# Patient Record
Sex: Male | Born: 1985 | Race: Black or African American | Hispanic: No | Marital: Single | Smoking: Former smoker
Health system: Southern US, Community
[De-identification: ages and names within clinical notes are randomized; demographics above are authoritative.]

## PROBLEM LIST (undated history)

## (undated) DIAGNOSIS — J189 Pneumonia, unspecified organism: Secondary | ICD-10-CM

## (undated) DIAGNOSIS — E119 Type 2 diabetes mellitus without complications: Secondary | ICD-10-CM

## (undated) DIAGNOSIS — J45909 Unspecified asthma, uncomplicated: Secondary | ICD-10-CM

---
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
## 2007-06-30 ENCOUNTER — Emergency Department (HOSPITAL_COMMUNITY): Admission: EM | Admit: 2007-06-30 | Discharge: 2007-06-30 | Payer: Self-pay | Admitting: Emergency Medicine

## 2007-07-23 ENCOUNTER — Emergency Department (HOSPITAL_COMMUNITY): Admission: EM | Admit: 2007-07-23 | Discharge: 2007-07-23 | Payer: Self-pay | Admitting: Emergency Medicine

## 2007-08-04 ENCOUNTER — Emergency Department (HOSPITAL_COMMUNITY): Admission: EM | Admit: 2007-08-04 | Discharge: 2007-08-04 | Payer: Self-pay | Admitting: Emergency Medicine

## 2008-02-27 ENCOUNTER — Emergency Department (HOSPITAL_COMMUNITY): Admission: EM | Admit: 2008-02-27 | Discharge: 2008-02-27 | Payer: Self-pay | Admitting: Emergency Medicine

## 2008-12-04 ENCOUNTER — Emergency Department (HOSPITAL_COMMUNITY): Admission: EM | Admit: 2008-12-04 | Discharge: 2008-12-04 | Payer: Self-pay | Admitting: Emergency Medicine

## 2008-12-18 ENCOUNTER — Emergency Department (HOSPITAL_COMMUNITY): Admission: EM | Admit: 2008-12-18 | Discharge: 2008-12-18 | Payer: Self-pay | Admitting: Emergency Medicine

## 2008-12-26 ENCOUNTER — Encounter: Payer: Self-pay | Admitting: *Deleted

## 2009-05-06 ENCOUNTER — Emergency Department (HOSPITAL_COMMUNITY): Admission: EM | Admit: 2009-05-06 | Discharge: 2009-05-06 | Payer: Self-pay | Admitting: Emergency Medicine

## 2009-07-24 ENCOUNTER — Inpatient Hospital Stay (HOSPITAL_COMMUNITY): Admission: EM | Admit: 2009-07-24 | Discharge: 2009-07-27 | Payer: Self-pay | Admitting: Emergency Medicine

## 2009-07-24 ENCOUNTER — Ambulatory Visit: Payer: Self-pay | Admitting: Internal Medicine

## 2009-07-30 ENCOUNTER — Telehealth: Payer: Self-pay | Admitting: Internal Medicine

## 2009-08-02 DIAGNOSIS — R7309 Other abnormal glucose: Secondary | ICD-10-CM | POA: Insufficient documentation

## 2009-08-02 DIAGNOSIS — J9600 Acute respiratory failure, unspecified whether with hypoxia or hypercapnia: Secondary | ICD-10-CM | POA: Insufficient documentation

## 2009-08-02 DIAGNOSIS — K219 Gastro-esophageal reflux disease without esophagitis: Secondary | ICD-10-CM

## 2009-08-02 DIAGNOSIS — J309 Allergic rhinitis, unspecified: Secondary | ICD-10-CM | POA: Insufficient documentation

## 2009-08-06 ENCOUNTER — Telehealth: Payer: Self-pay | Admitting: Internal Medicine

## 2009-08-07 ENCOUNTER — Encounter (INDEPENDENT_AMBULATORY_CARE_PROVIDER_SITE_OTHER): Payer: Self-pay | Admitting: *Deleted

## 2010-03-21 NOTE — Letter (Signed)
Summary: [facility]  [address]   [place], [place] [address]   Phone: [phone]  Fax: [phone]    08/07/2009  Henry Hardin [address] [place], [place]  [address]  Dear Henry Hardin,   Our records indicate that you did not keep your appointment with Dr. Sherene Sires for your hospital followup.  It is critical that you reschedule this appointment so that we can offer you the best possible care and try to keep you from having to go back to the hospital.  Please call our office at [phone] to schedule appointment as soon as possible.          Sincerely,   [facility]

## 2010-03-21 NOTE — Progress Notes (Signed)
Summary: PA for advair approved  Phone Note Outgoing Call   Call placed by: Vernie Murders,  July 30, 2009 4:11 PM Call placed to: Insurer Summary of Call: Called [employer] for PA on advair 250/50.  Was able to approve this over the phone from 07/30/09 until 07/30/11.  Faxed back PA form to [employer] [address] with this information. Initial call taken by: Vernie Murders,  July 30, 2009 4:12 PM

## 2010-03-21 NOTE — Progress Notes (Signed)
Summary: nos appt  Phone Note Call from Patient   Caller: [email or web address]  Call For: Henry Hardin Summary of Call: ATC pt to rsc nos from 6/17 phone disconnected. Initial call taken by: Darletta Moll,  August 06, 2009 2:47 PM     Appended Document: nos appt will need to send letter to last know address, it is critical that pt get f/u asap  Appended Document: nos appt Letter mailed today.

## 2010-05-06 LAB — GLUCOSE, CAPILLARY
Glucose-Capillary: 136 mg/dL — ABNORMAL HIGH (ref 70–99)
Glucose-Capillary: 150 mg/dL — ABNORMAL HIGH (ref 70–99)
Glucose-Capillary: 166 mg/dL — ABNORMAL HIGH (ref 70–99)
Glucose-Capillary: 190 mg/dL — ABNORMAL HIGH (ref 70–99)
Glucose-Capillary: 221 mg/dL — ABNORMAL HIGH (ref 70–99)
Glucose-Capillary: 226 mg/dL — ABNORMAL HIGH (ref 70–99)

## 2010-05-06 LAB — BASIC METABOLIC PANEL
CO2: 24 mEq/L (ref 19–32)
Calcium: 8.9 mg/dL (ref 8.4–10.5)
Chloride: 107 mEq/L (ref 96–112)
Creatinine, Ser: 1.2 mg/dL (ref 0.4–1.5)
Creatinine, Ser: 1.23 mg/dL (ref 0.4–1.5)
GFR calc Af Amer: >60 mL/min (ref >60)
GFR calc Af Amer: >60 mL/min (ref >60)
GFR calc non Af Amer: >60 mL/min (ref >60)
GFR calc non Af Amer: >60 mL/min (ref >60)
Glucose, Bld: 172 mg/dL — ABNORMAL HIGH (ref 70–99)
Potassium: 4.4 mEq/L (ref 3.5–5.1)
Sodium: 138 mEq/L (ref 135–145)
Sodium: 139 mEq/L (ref 135–145)
Sodium: 141 mEq/L (ref 135–145)

## 2010-05-06 LAB — POCT I-STAT 3, ART BLOOD GAS (G3+)
Acid-base deficit: 1 mmol/L (ref 0.0–2.0)
Bicarbonate: 26.2 mEq/L — ABNORMAL HIGH (ref 20.0–24.0)
O2 Saturation: 86 %
pCO2 arterial: 51.3 mmHg — ABNORMAL HIGH (ref 35.0–45.0)
pH, Arterial: 7.315 — ABNORMAL LOW (ref 7.350–7.450)
pO2, Arterial: 56 mmHg — ABNORMAL LOW (ref 80.0–100.0)

## 2010-05-06 LAB — DIFFERENTIAL
Eosinophils Absolute: 0.6 10*3/uL (ref 0.0–0.7)
Eosinophils Relative: 5 % (ref 0–5)
Lymphs Abs: 8 10*3/uL — ABNORMAL HIGH (ref 0.7–4.0)
Monocytes Absolute: 1 10*3/uL (ref 0.1–1.0)
Monocytes Relative: 8 % (ref 3–12)
Neutrophils Relative %: 25 % — ABNORMAL LOW (ref 43–77)

## 2010-05-06 LAB — CK TOTAL AND CKMB (NOT AT ARMC): CK, MB: 9.5 ng/mL (ref 0.3–4.0)

## 2010-05-06 LAB — HEPATIC FUNCTION PANEL
ALT: 22 U/L (ref 0–53)
Albumin: 3.5 g/dL (ref 3.5–5.2)
Total Protein: 6.7 g/dL (ref 6.0–8.3)

## 2010-05-06 LAB — URINALYSIS, ROUTINE W REFLEX MICROSCOPIC
Leukocytes, UA: NEGATIVE
Specific Gravity, Urine: 1.018 (ref 1.005–1.030)

## 2010-05-06 LAB — LIPID PANEL
Cholesterol: 113 mg/dL (ref 0–200)
HDL: 41 mg/dL (ref >39)
Total CHOL/HDL Ratio: 2.8 RATIO
VLDL: 7 mg/dL (ref 0–40)

## 2010-05-06 LAB — RAPID URINE DRUG SCREEN, HOSP PERFORMED
Barbiturates: NOT DETECTED
Benzodiazepines: POSITIVE — AB
Cocaine: NOT DETECTED
Opiates: NOT DETECTED
Tetrahydrocannabinol: NOT DETECTED

## 2010-05-06 LAB — CBC
HCT: 41.7 % (ref 39.0–52.0)
HCT: 45.8 % (ref 39.0–52.0)
Hemoglobin: 13.9 g/dL (ref 13.0–17.0)
Hemoglobin: 15.4 g/dL (ref 13.0–17.0)
MCV: 88.8 fL (ref 78.0–100.0)
MCV: 89 fL (ref 78.0–100.0)
RBC: 4.69 MIL/uL (ref 4.22–5.81)
RBC: 5.16 MIL/uL (ref 4.22–5.81)
RDW: 13 % (ref 11.5–15.5)
WBC: 10.6 10*3/uL — ABNORMAL HIGH (ref 4.0–10.5)
WBC: 12.8 10*3/uL — ABNORMAL HIGH (ref 4.0–10.5)

## 2010-05-06 LAB — PHOSPHORUS: Phosphorus: 2.8 mg/dL (ref 2.3–4.6)

## 2010-05-06 LAB — LACTIC ACID, PLASMA: Lactic Acid, Venous: 1.3 mmol/L (ref 0.5–2.2)

## 2010-05-06 LAB — CARDIAC PANEL(CRET KIN+CKTOT+MB+TROPI)
CK, MB: 13.9 ng/mL (ref 0.3–4.0)
Total CK: 797 U/L — ABNORMAL HIGH (ref 7–232)

## 2010-05-06 LAB — LIPASE, BLOOD: Lipase: 29 U/L (ref 11–59)

## 2010-05-06 LAB — URINE MICROSCOPIC-ADD ON

## 2010-05-06 LAB — TROPONIN I: Troponin I: 0.13 ng/mL — ABNORMAL HIGH (ref 0.00–0.06)

## 2010-05-07 ENCOUNTER — Emergency Department (HOSPITAL_COMMUNITY): Payer: Medicaid Other

## 2010-05-07 ENCOUNTER — Emergency Department (HOSPITAL_COMMUNITY)
Admission: EM | Admit: 2010-05-07 | Discharge: 2010-05-07 | Disposition: A | Discharge status: Home / Self Care | Payer: Medicaid Other | Attending: Emergency Medicine | Admitting: Emergency Medicine

## 2010-05-07 DIAGNOSIS — Y9367 Activity, basketball: Secondary | ICD-10-CM | POA: Insufficient documentation

## 2010-05-07 DIAGNOSIS — J45909 Unspecified asthma, uncomplicated: Secondary | ICD-10-CM | POA: Insufficient documentation

## 2010-05-07 DIAGNOSIS — IMO0002 Reserved for concepts with insufficient information to code with codable children: Secondary | ICD-10-CM | POA: Insufficient documentation

## 2010-05-07 DIAGNOSIS — S8990XA Unspecified injury of unspecified lower leg, initial encounter: Secondary | ICD-10-CM | POA: Insufficient documentation

## 2010-05-07 DIAGNOSIS — Y9239 Other specified sports and athletic area as the place of occurrence of the external cause: Secondary | ICD-10-CM | POA: Insufficient documentation

## 2010-05-07 DIAGNOSIS — M25569 Pain in unspecified knee: Secondary | ICD-10-CM | POA: Insufficient documentation

## 2010-05-07 DIAGNOSIS — X500XXA Overexertion from strenuous movement or load, initial encounter: Secondary | ICD-10-CM | POA: Insufficient documentation

## 2010-05-10 ENCOUNTER — Other Ambulatory Visit (HOSPITAL_COMMUNITY): Payer: Self-pay | Admitting: Orthopedic Surgery

## 2010-05-10 DIAGNOSIS — S83519A Sprain of anterior cruciate ligament of unspecified knee, initial encounter: Secondary | ICD-10-CM

## 2010-05-10 DIAGNOSIS — M25562 Pain in left knee: Secondary | ICD-10-CM

## 2010-05-16 ENCOUNTER — Ambulatory Visit (HOSPITAL_COMMUNITY)
Admission: RE | Admit: 2010-05-16 | Discharge: 2010-05-16 | Disposition: A | Discharge status: Home / Self Care | Payer: Medicaid Other | Source: Ambulatory Visit | Attending: Orthopedic Surgery | Admitting: Orthopedic Surgery

## 2010-05-16 DIAGNOSIS — M25562 Pain in left knee: Secondary | ICD-10-CM

## 2010-05-16 DIAGNOSIS — S83509A Sprain of unspecified cruciate ligament of unspecified knee, initial encounter: Secondary | ICD-10-CM | POA: Insufficient documentation

## 2010-05-16 DIAGNOSIS — Y9367 Activity, basketball: Secondary | ICD-10-CM | POA: Insufficient documentation

## 2010-05-16 DIAGNOSIS — S83519A Sprain of anterior cruciate ligament of unspecified knee, initial encounter: Secondary | ICD-10-CM

## 2010-05-16 DIAGNOSIS — X58XXXA Exposure to other specified factors, initial encounter: Secondary | ICD-10-CM | POA: Insufficient documentation

## 2010-05-23 LAB — RAPID STREP SCREEN (MED CTR MEBANE ONLY): Streptococcus, Group A Screen (Direct): POSITIVE — AB

## 2010-06-01 ENCOUNTER — Emergency Department (HOSPITAL_COMMUNITY)
Admission: EM | Admit: 2010-06-01 | Discharge: 2010-06-02 | Disposition: A | Discharge status: Home / Self Care | Payer: Medicaid Other | Attending: Emergency Medicine | Admitting: Emergency Medicine

## 2010-06-01 ENCOUNTER — Emergency Department (HOSPITAL_COMMUNITY): Payer: Medicaid Other

## 2010-06-01 DIAGNOSIS — IMO0002 Reserved for concepts with insufficient information to code with codable children: Secondary | ICD-10-CM | POA: Insufficient documentation

## 2010-06-01 DIAGNOSIS — M25669 Stiffness of unspecified knee, not elsewhere classified: Secondary | ICD-10-CM | POA: Insufficient documentation

## 2010-06-01 DIAGNOSIS — M25569 Pain in unspecified knee: Secondary | ICD-10-CM | POA: Insufficient documentation

## 2010-06-01 DIAGNOSIS — S8990XA Unspecified injury of unspecified lower leg, initial encounter: Secondary | ICD-10-CM | POA: Insufficient documentation

## 2010-06-01 DIAGNOSIS — X500XXA Overexertion from strenuous movement or load, initial encounter: Secondary | ICD-10-CM | POA: Insufficient documentation

## 2010-06-01 DIAGNOSIS — Y9367 Activity, basketball: Secondary | ICD-10-CM | POA: Insufficient documentation

## 2010-06-01 DIAGNOSIS — M25469 Effusion, unspecified knee: Secondary | ICD-10-CM | POA: Insufficient documentation

## 2010-06-01 DIAGNOSIS — Y9239 Other specified sports and athletic area as the place of occurrence of the external cause: Secondary | ICD-10-CM | POA: Insufficient documentation

## 2010-06-01 DIAGNOSIS — Y92838 Other recreation area as the place of occurrence of the external cause: Secondary | ICD-10-CM | POA: Insufficient documentation

## 2010-08-30 ENCOUNTER — Emergency Department (HOSPITAL_COMMUNITY)
Admission: EM | Admit: 2010-08-30 | Discharge: 2010-08-30 | Disposition: A | Discharge status: Home / Self Care | Payer: Medicaid Other | Attending: Emergency Medicine | Admitting: Emergency Medicine

## 2010-08-30 DIAGNOSIS — J45909 Unspecified asthma, uncomplicated: Secondary | ICD-10-CM | POA: Insufficient documentation

## 2010-10-21 ENCOUNTER — Emergency Department (HOSPITAL_COMMUNITY)
Admission: EM | Admit: 2010-10-21 | Discharge: 2010-10-21 | Disposition: A | Discharge status: Home / Self Care | Payer: Self-pay | Attending: Emergency Medicine | Admitting: Emergency Medicine

## 2010-10-21 DIAGNOSIS — T7840XA Allergy, unspecified, initial encounter: Secondary | ICD-10-CM | POA: Insufficient documentation

## 2010-10-21 DIAGNOSIS — X58XXXA Exposure to other specified factors, initial encounter: Secondary | ICD-10-CM | POA: Insufficient documentation

## 2010-10-21 DIAGNOSIS — J45909 Unspecified asthma, uncomplicated: Secondary | ICD-10-CM | POA: Insufficient documentation

## 2010-11-22 ENCOUNTER — Emergency Department (HOSPITAL_COMMUNITY)
Admission: EM | Admit: 2010-11-22 | Discharge: 2010-11-22 | Disposition: A | Discharge status: Home / Self Care | Payer: Self-pay | Attending: Emergency Medicine | Admitting: Emergency Medicine

## 2010-11-22 DIAGNOSIS — J069 Acute upper respiratory infection, unspecified: Secondary | ICD-10-CM | POA: Insufficient documentation

## 2010-11-22 DIAGNOSIS — R070 Pain in throat: Secondary | ICD-10-CM | POA: Insufficient documentation

## 2010-11-22 DIAGNOSIS — J3489 Other specified disorders of nose and nasal sinuses: Secondary | ICD-10-CM | POA: Insufficient documentation

## 2010-11-22 DIAGNOSIS — R05 Cough: Secondary | ICD-10-CM | POA: Insufficient documentation

## 2010-11-22 DIAGNOSIS — R509 Fever, unspecified: Secondary | ICD-10-CM | POA: Insufficient documentation

## 2010-11-22 DIAGNOSIS — R059 Cough, unspecified: Secondary | ICD-10-CM | POA: Insufficient documentation

## 2010-11-22 DIAGNOSIS — R599 Enlarged lymph nodes, unspecified: Secondary | ICD-10-CM | POA: Insufficient documentation

## 2010-11-22 DIAGNOSIS — J45909 Unspecified asthma, uncomplicated: Secondary | ICD-10-CM | POA: Insufficient documentation

## 2010-11-22 LAB — RAPID STREP SCREEN (MED CTR MEBANE ONLY): Streptococcus, Group A Screen (Direct): NEGATIVE

## 2011-12-14 ENCOUNTER — Encounter (HOSPITAL_COMMUNITY): Payer: Self-pay

## 2011-12-14 ENCOUNTER — Emergency Department (HOSPITAL_COMMUNITY): Payer: BC Managed Care – PPO

## 2011-12-14 ENCOUNTER — Emergency Department (HOSPITAL_COMMUNITY)
Admission: EM | Admit: 2011-12-14 | Discharge: 2011-12-14 | Disposition: A | Discharge status: Home / Self Care | Payer: BC Managed Care – PPO | Attending: Emergency Medicine | Admitting: Emergency Medicine

## 2011-12-14 DIAGNOSIS — F17200 Nicotine dependence, unspecified, uncomplicated: Secondary | ICD-10-CM | POA: Insufficient documentation

## 2011-12-14 DIAGNOSIS — R059 Cough, unspecified: Secondary | ICD-10-CM | POA: Insufficient documentation

## 2011-12-14 DIAGNOSIS — R110 Nausea: Secondary | ICD-10-CM | POA: Insufficient documentation

## 2011-12-14 DIAGNOSIS — R05 Cough: Secondary | ICD-10-CM | POA: Insufficient documentation

## 2011-12-14 DIAGNOSIS — R062 Wheezing: Secondary | ICD-10-CM | POA: Insufficient documentation

## 2011-12-14 DIAGNOSIS — J45901 Unspecified asthma with (acute) exacerbation: Secondary | ICD-10-CM | POA: Insufficient documentation

## 2011-12-14 HISTORY — DX: Unspecified asthma, uncomplicated: J45.909

## 2011-12-14 MED ORDER — ONDANSETRON HCL 4 MG/2ML IJ SOLN
4.0000 mg | Freq: Once | INTRAMUSCULAR | Status: AC
  Administered 2011-12-14: 4 mg via INTRAVENOUS
  Filled 2011-12-14: qty 2

## 2011-12-14 MED ORDER — ALBUTEROL SULFATE (5 MG/ML) 0.5% IN NEBU: 2.5000 mg | INHALATION_SOLUTION | Freq: Four times a day (QID) | RESPIRATORY_TRACT | Status: DC | PRN

## 2011-12-14 MED ORDER — SODIUM CHLORIDE 0.9 % IV BOLUS (SEPSIS)
1000.0000 mL | Freq: Once | INTRAVENOUS | Status: AC
  Administered 2011-12-14: 1000 mL via INTRAVENOUS

## 2011-12-14 NOTE — ED Provider Notes (Signed)
History     CSN: [id number]  Arrival date & time 12/14/11  4098   First MD Initiated Contact with Patient 12/14/11 0601      Chief Complaint  Patient presents with  . Shortness of Breath    (Consider location/radiation/quality/duration/timing/severity/associated sxs/prior treatment) Patient is a 26 y.o. male presenting with shortness of breath. The history is provided by the patient. No language interpreter was used.  Shortness of Breath  The current episode started today. The onset was sudden. The problem occurs occasionally. The problem has been gradually improving. The symptoms are aggravated by activity. Associated symptoms include cough, shortness of breath and wheezing. He has not inhaled smoke recently. He has had intermittent steroid use. He has had prior hospitalizations. He has had prior ICU admissions.   26 year old male coming in with the asthma attack that started around 2 AM when he awoke. States he used his rescue inhaler unknown number of times with no improvement. He got 2 breathing treatments in route with EMS and Solu-Medrol 125. Patient sounds clear now but has a productive cough. Denies fever. States that he is in the ICU before but never intubated. States that he has these exacerbations about twice a week. Patient does not have a PCP because he has not looked for one. Patient has Medicaid and [employer]. Patient is a smoker. No other past medical history. Patient states he is nauseated presently the Levoxyl water.  Past Medical History  Diagnosis Date  . Asthma     History reviewed. No pertinent past surgical history.  History reviewed. No pertinent family history.  History  Substance Use Topics  . Smoking status: Current Every Day Smoker -- 0.5 packs/day  . Smokeless tobacco: Never Used  . Alcohol Use: Yes      Review of Systems  Constitutional: Negative.   HENT: Negative.   Eyes: Negative.   Respiratory: Positive for cough, shortness of breath  and wheezing.   Cardiovascular: Negative.   Gastrointestinal: Negative.   Neurological: Negative.   Psychiatric/Behavioral: Negative.   All other systems reviewed and are negative.    Allergies  Review of patient's allergies indicates no known allergies.  Home Medications   Current Outpatient Rx  Name Route Sig Dispense Refill  . ALBUTEROL SULFATE HFA 108 (90 BASE) MCG/ACT IN AERS Inhalation Inhale 2 puffs into the lungs every 6 (six) hours as needed. For shortness of breath    . ALBUTEROL SULFATE (2.5 MG/3ML) 0.083% IN NEBU Nebulization Take 2.5 mg by nebulization every 6 (six) hours as needed. For respiratory distress      BP 144/84  Pulse 86  Temp 97.9 F (36.6 C) (Axillary)  Resp 18  SpO2 98%  Physical Exam  Nursing note and vitals reviewed. Constitutional: He is oriented to person, place, and time. He appears well-developed and well-nourished.  HENT:  Head: Normocephalic.  Eyes: Conjunctivae normal and EOM are normal. Pupils are equal, round, and reactive to light.  Neck: Normal range of motion. Neck supple.  Cardiovascular: Normal rate.   Pulmonary/Chest: Effort normal and breath sounds normal. No respiratory distress. He has no wheezes.  Abdominal: Soft.  Musculoskeletal: Normal range of motion.  Neurological: He is alert and oriented to person, place, and time.  Skin: Skin is warm and dry.  Psychiatric: He has a normal mood and affect.    ED Course  Procedures (including critical care time)  Labs Reviewed - No data to display No results found.   No diagnosis found.  MDM  Asthma attack better after solumedrol 125mg  and albuterol neb.  Out of nebs at home.  Rx for albuterol.  Zosyn and ns bolus in er.  Will get pcp from list to follow up with.  Return to ER for worsening symptoms.        Remi Haggard, NP 12/14/11 1923

## 2011-12-14 NOTE — ED Notes (Signed)
MD at bedside. 

## 2011-12-14 NOTE — ED Provider Notes (Signed)
Medical screening examination/treatment/procedure(s) were conducted as a shared visit with non-physician practitioner(s) and myself.  I personally evaluated the patient during the encounter  Brandt Loosen, MD 12/14/11 [phone]

## 2011-12-14 NOTE — ED Notes (Signed)
Patient transported to X-ray 

## 2011-12-14 NOTE — ED Notes (Signed)
Per EMS: 5 x 2 albuterol  0.5 Atrovent. 125 solumedrol Wheezing bilaterally. . 18G L AC. Hxt asthma.

## 2011-12-14 NOTE — ED Notes (Signed)
A.O. X 4. Complains of chest tightness and SOB. States "He woke up this morning with SOB. Unrelieved by use of inhaler so he called EMS. Reports hxt of asthma. Reports current every day smoking, less that half a pack today.  States "I feel like I am going to pass out". Denies changes in vision. Denies headache.  Sitting up in bed in high fowler's. Side rail for safety.  SpO2 100% on NRB.   Updated on Plan of Care: aware that EDP will be in to assess him as soon as possible.

## 2012-06-07 ENCOUNTER — Emergency Department (HOSPITAL_COMMUNITY)
Admission: EM | Admit: 2012-06-07 | Discharge: 2012-06-07 | Disposition: A | Discharge status: Home / Self Care | Payer: BC Managed Care – PPO | Attending: Emergency Medicine | Admitting: Emergency Medicine

## 2012-06-07 DIAGNOSIS — F17200 Nicotine dependence, unspecified, uncomplicated: Secondary | ICD-10-CM | POA: Insufficient documentation

## 2012-06-07 DIAGNOSIS — Z79899 Other long term (current) drug therapy: Secondary | ICD-10-CM | POA: Insufficient documentation

## 2012-06-07 DIAGNOSIS — J45901 Unspecified asthma with (acute) exacerbation: Secondary | ICD-10-CM | POA: Insufficient documentation

## 2012-06-07 MED ORDER — ALBUTEROL SULFATE HFA 108 (90 BASE) MCG/ACT IN AERS: 2.0000 | INHALATION_SPRAY | RESPIRATORY_TRACT | Status: DC | PRN

## 2012-06-07 MED ORDER — ALBUTEROL SULFATE (5 MG/ML) 0.5% IN NEBU: 2.5000 mg | INHALATION_SOLUTION | RESPIRATORY_TRACT | Status: DC | PRN

## 2012-06-07 NOTE — ED Notes (Signed)
Pt received a Neb treatment per EMS . On assessment Pt reported he felt perfect.

## 2012-06-07 NOTE — ED Provider Notes (Signed)
History    This chart was scribed for non-physician practitioner working with [name], * by Donne Anon, ED Scribe. This patient was seen in room TR06C/TR06C and the patient's care was started at 1827.   CSN: [id number]  Arrival date & time 06/07/12  1638   First MD Initiated Contact with Patient 06/07/12 1827      Chief Complaint  Patient presents with  . Wheezing     The history is provided by the patient. No language interpreter was used.   Henry Hardin is a 27 y.o. male brought in by ambulance, who presents to the Emergency Department complaining of  sudden onset, moderate SOB which began 3.5 hours PTA. He states he has a h/o asthma and felt like he was having a asthma attack and did not have an inhaler. He states that this felt similar to previous asthma attacks. He was given a breathing treatment in the ambulance which he reports relieved his symptoms. He states his last asthma attack was 2 days ago, and he was able to use his inhaler, which he has since lost. He denies CP or any other pain. He reports he has been intubated once 2 years ago from an asthma attack. He has a nebulizer at home but he does not use it.   Past Medical History  Diagnosis Date  . Asthma     No past surgical history on file.  No family history on file.  History  Substance Use Topics  . Smoking status: Current Every Day Smoker -- 0.50 packs/day  . Smokeless tobacco: Never Used  . Alcohol Use: Yes      Review of Systems  Constitutional: Negative for fever and diaphoresis.  HENT: Negative for neck pain and neck stiffness.   Eyes: Negative for visual disturbance.  Respiratory: Positive for shortness of breath. Negative for apnea, chest tightness and wheezing.   Cardiovascular: Negative for chest pain and palpitations.  Gastrointestinal: Negative for nausea, vomiting, diarrhea and constipation.  Genitourinary: Negative for dysuria.  Musculoskeletal: Negative for gait problem.   Skin: Negative for rash.  Neurological: Negative for dizziness, weakness, light-headedness, numbness and headaches.    Allergies  Review of patient's allergies indicates no known allergies.  Home Medications   Current Outpatient Rx  Name  Route  Sig  Dispense  Refill  . albuterol (PROVENTIL HFA;VENTOLIN HFA) 108 (90 BASE) MCG/ACT inhaler   Inhalation   Inhale 2 puffs into the lungs every 6 (six) hours as needed. For shortness of breath         . loratadine (CLARITIN) 10 MG tablet   Oral   Take 10 mg by mouth daily as needed for allergies.           BP 108/62  Pulse 59  Temp(Src) 97.9 F (36.6 C)  Resp 18  SpO2 98%  Physical Exam  Nursing note and vitals reviewed. Constitutional: He is oriented to person, place, and time. He appears well-developed and well-nourished. No distress.  HENT:  Head: Normocephalic and atraumatic.  Eyes: Conjunctivae and EOM are normal.  Neck: Normal range of motion. Neck supple.  No meningeal signs  Cardiovascular: Normal rate, regular rhythm and normal heart sounds.  Exam reveals no gallop and no friction rub.   No murmur heard. Pulmonary/Chest: Effort normal and breath sounds normal. No respiratory distress. He has no wheezes. He has no rales. He exhibits no tenderness.  Abdominal: Soft.  Musculoskeletal: Normal range of motion. He exhibits no edema and no  tenderness.  Neurological: He is alert and oriented to person, place, and time. No cranial nerve deficit.  Skin: Skin is warm and dry. He is not diaphoretic. No erythema.    ED Course  Procedures (including critical care time) DIAGNOSTIC STUDIES: Oxygen Saturation is 98% on room air, normal by my interpretation.    COORDINATION OF CARE: 6:49 PM Discussed treatment plan which includes an inhaler and nebulizer solutions with pt at bedside and pt agreed to plan. Will provide pt with a PCP resource guide.    Labs Reviewed - No data to display No results found.   1. Asthma  exacerbation       MDM  27 y.o. male PHMx of asthma, complaining of  sudden onset, moderate SOB which began 3.5 hours PTA.  Patient ambulated in ED with O2 saturations maintained >90, no current signs of respiratory distress. Lungs CTA. Pt states they are breathing at baseline. Pt has been instructed to continue using prescribed medications. Will prescribe albuterol inhaler and provide refills for nebulizer machine. Provided PCP resource list. Encouraged primary care follow up and discussed return precautions.  At this time there does not appear to be any evidence of an acute emergency medical condition and the patient appears stable for discharge with appropriate outpatient follow up.Diagnosis was discussed with patient who verbalizes understanding and is agreeable to discharge.   I personally performed the services described in this documentation, which was scribed in my presence. The recorded information has been reviewed and is accurate.    Glade Nurse, PA-C 06/08/12 0116

## 2012-06-07 NOTE — ED Notes (Signed)
Pt reports his mother has his HHN. Pt called EMS for the wheezing.

## 2012-06-11 NOTE — ED Provider Notes (Signed)
Medical screening examination/treatment/procedure(s) were performed by non-physician practitioner and as supervising physician I was immediately available for consultation/collaboration.    Christopher J. Pollina, MD 06/11/12 1545 

## 2012-10-07 ENCOUNTER — Encounter (HOSPITAL_COMMUNITY): Payer: Self-pay

## 2012-10-07 ENCOUNTER — Emergency Department (HOSPITAL_COMMUNITY)
Admission: EM | Admit: 2012-10-07 | Discharge: 2012-10-07 | Disposition: A | Discharge status: Home / Self Care | Payer: BC Managed Care – PPO | Attending: Emergency Medicine | Admitting: Emergency Medicine

## 2012-10-07 DIAGNOSIS — IMO0002 Reserved for concepts with insufficient information to code with codable children: Secondary | ICD-10-CM | POA: Insufficient documentation

## 2012-10-07 DIAGNOSIS — F17200 Nicotine dependence, unspecified, uncomplicated: Secondary | ICD-10-CM | POA: Insufficient documentation

## 2012-10-07 DIAGNOSIS — Z79899 Other long term (current) drug therapy: Secondary | ICD-10-CM | POA: Insufficient documentation

## 2012-10-07 DIAGNOSIS — J45901 Unspecified asthma with (acute) exacerbation: Secondary | ICD-10-CM | POA: Insufficient documentation

## 2012-10-07 MED ORDER — PREDNISONE 20 MG PO TABS
60.0000 mg | ORAL_TABLET | Freq: Once | ORAL | Status: AC
  Administered 2012-10-07: 60 mg via ORAL
  Filled 2012-10-07: qty 3

## 2012-10-07 MED ORDER — PREDNISONE 20 MG PO TABS: 60.0000 mg | ORAL_TABLET | Freq: Every day | ORAL | Status: AC

## 2012-10-07 MED ORDER — ALBUTEROL SULFATE HFA 108 (90 BASE) MCG/ACT IN AERS: 2.0000 | INHALATION_SPRAY | RESPIRATORY_TRACT | Status: DC | PRN

## 2012-10-07 NOTE — ED Provider Notes (Signed)
CSN: [id number]     Arrival date & time 10/07/12  0718 History     First MD Initiated Contact with Patient 10/07/12 [phone]     Chief Complaint  Patient presents with  . Asthma   (Consider location/radiation/quality/duration/timing/severity/associated sxs/prior Treatment) Patient is a 27 y.o. male presenting with asthma. The history is provided by the patient.  Asthma This is a recurrent problem. The current episode started less than 1 hour ago. The problem occurs constantly. The problem has been resolved. Associated symptoms include shortness of breath. Pertinent negatives include no chest pain and no abdominal pain. Nothing aggravates the symptoms. Relieved by: 1 albuterol neb.    Past Medical History  Diagnosis Date  . Asthma    History reviewed. No pertinent past surgical history. History reviewed. No pertinent family history. History  Substance Use Topics  . Smoking status: Current Every Day Smoker -- 0.02 packs/day  . Smokeless tobacco: Never Used  . Alcohol Use: No    Review of Systems  Constitutional: Negative for fever and chills.  Respiratory: Positive for shortness of breath and wheezing. Negative for cough and chest tightness.   Cardiovascular: Negative for chest pain.  Gastrointestinal: Negative for vomiting and abdominal pain.  All other systems reviewed and are negative.    Allergies  Review of patient's allergies indicates no known allergies.  Home Medications   Current Outpatient Rx  Name  Route  Sig  Dispense  Refill  . albuterol (PROVENTIL HFA;VENTOLIN HFA) 108 (90 BASE) MCG/ACT inhaler   Inhalation   Inhale 2 puffs into the lungs every 6 (six) hours as needed. For shortness of breath         . albuterol (PROVENTIL HFA;VENTOLIN HFA) 108 (90 BASE) MCG/ACT inhaler   Inhalation   Inhale 2 puffs into the lungs every 4 (four) hours as needed for wheezing or shortness of breath.   1 Inhaler   0   . albuterol (PROVENTIL HFA;VENTOLIN HFA) 108 (90 BASE)  MCG/ACT inhaler   Inhalation   Inhale 2 puffs into the lungs every 2 (two) hours as needed for wheezing or shortness of breath (cough).   1 Inhaler   1   . albuterol (PROVENTIL) (5 MG/ML) 0.5% nebulizer solution   Nebulization   Take 0.5 mLs (2.5 mg total) by nebulization every 4 (four) hours as needed for wheezing or shortness of breath.   20 mL   0   . loratadine (CLARITIN) 10 MG tablet   Oral   Take 10 mg by mouth daily as needed for allergies.         . predniSONE (DELTASONE) 20 MG tablet   Oral   Take 3 tablets (60 mg total) by mouth daily.   12 tablet   0    BP 136/88  Pulse 72  Temp(Src) 98.3 F (36.8 C) (Oral)  Resp 18  SpO2 98% Physical Exam  Nursing note and vitals reviewed. Constitutional: He is oriented to person, place, and time. He appears well-developed and well-nourished.  HENT:  Head: Normocephalic and atraumatic.  Right Ear: External ear normal.  Left Ear: External ear normal.  Nose: Nose normal.  Eyes: Right eye exhibits no discharge. Left eye exhibits no discharge.  Neck: Neck supple.  Cardiovascular: Normal rate, regular rhythm, normal heart sounds and intact distal pulses.   Pulmonary/Chest: Effort normal and breath sounds normal. He has no wheezes. He has no rales.  Abdominal: Soft. There is no tenderness.  Musculoskeletal: He exhibits no edema.  Neurological: He  is alert and oriented to person, place, and time.  Skin: Skin is warm and dry.    ED Course   Procedures (including critical care time)  Labs Reviewed - No data to display No results found. 1. Mild asthma exacerbation     MDM  27 year old male with acute mild asthma exacerbation. Started about one hour ago, and was relieved with one albuterol neb by EMS. Remains clear in the ED. No cough or fever. Has run out of his inhaler, I will refill and give a short course of steroids.  Audree Camel, MD 10/07/12 [phone]

## 2012-10-07 NOTE — ED Notes (Signed)
Per [facility], pt with hx of asthma, ambulatory to truck. Wheezing all fields, given 1 albuterol trxt and lung fields clear now per EMS. Is in process of switching MDs and out of inhaler. VSS.

## 2012-10-07 NOTE — ED Notes (Signed)
Dr. Goldston at the bedside. 

## 2012-12-06 ENCOUNTER — Emergency Department (HOSPITAL_COMMUNITY): Payer: No Typology Code available for payment source

## 2012-12-06 ENCOUNTER — Emergency Department (HOSPITAL_COMMUNITY)
Admission: EM | Admit: 2012-12-06 | Discharge: 2012-12-06 | Disposition: A | Discharge status: Home / Self Care | Payer: No Typology Code available for payment source | Attending: Emergency Medicine | Admitting: Emergency Medicine

## 2012-12-06 ENCOUNTER — Encounter (HOSPITAL_COMMUNITY): Payer: Self-pay | Admitting: Emergency Medicine

## 2012-12-06 DIAGNOSIS — S20219A Contusion of unspecified front wall of thorax, initial encounter: Secondary | ICD-10-CM | POA: Insufficient documentation

## 2012-12-06 DIAGNOSIS — J45909 Unspecified asthma, uncomplicated: Secondary | ICD-10-CM | POA: Insufficient documentation

## 2012-12-06 DIAGNOSIS — F17200 Nicotine dependence, unspecified, uncomplicated: Secondary | ICD-10-CM | POA: Insufficient documentation

## 2012-12-06 DIAGNOSIS — S20211A Contusion of right front wall of thorax, initial encounter: Secondary | ICD-10-CM

## 2012-12-06 DIAGNOSIS — Y92410 Unspecified street and highway as the place of occurrence of the external cause: Secondary | ICD-10-CM | POA: Insufficient documentation

## 2012-12-06 DIAGNOSIS — S76219A Strain of adductor muscle, fascia and tendon of unspecified thigh, initial encounter: Secondary | ICD-10-CM

## 2012-12-06 DIAGNOSIS — IMO0002 Reserved for concepts with insufficient information to code with codable children: Secondary | ICD-10-CM | POA: Insufficient documentation

## 2012-12-06 DIAGNOSIS — Z79899 Other long term (current) drug therapy: Secondary | ICD-10-CM | POA: Insufficient documentation

## 2012-12-06 DIAGNOSIS — Y939 Activity, unspecified: Secondary | ICD-10-CM | POA: Insufficient documentation

## 2012-12-06 DIAGNOSIS — S8990XA Unspecified injury of unspecified lower leg, initial encounter: Secondary | ICD-10-CM | POA: Insufficient documentation

## 2012-12-06 MED ORDER — ALBUTEROL SULFATE HFA 108 (90 BASE) MCG/ACT IN AERS
2.0000 | INHALATION_SPRAY | Freq: Once | RESPIRATORY_TRACT | Status: AC
  Administered 2012-12-06: 2 via RESPIRATORY_TRACT
  Filled 2012-12-06: qty 6.7

## 2012-12-06 MED ORDER — TRAMADOL HCL 50 MG PO TABS: 50.0000 mg | ORAL_TABLET | Freq: Four times a day (QID) | ORAL | Status: DC | PRN
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
## 2012-12-12 NOTE — ED Provider Notes (Signed)
History/physical exam/procedure(s) were performed by non-physician practitioner and as supervising physician I was immediately available for consultation/collaboration. I have reviewed all notes and am in agreement with care and plan.   Chantia Amalfitano S Buffie Herne, MD 12/12/12 1504 

## 2012-12-17 ENCOUNTER — Encounter (HOSPITAL_COMMUNITY): Payer: Self-pay | Admitting: Emergency Medicine

## 2012-12-17 ENCOUNTER — Emergency Department (HOSPITAL_COMMUNITY)
Admission: EM | Admit: 2012-12-17 | Discharge: 2012-12-17 | Disposition: A | Discharge status: Home / Self Care | Payer: No Typology Code available for payment source | Attending: Emergency Medicine | Admitting: Emergency Medicine

## 2012-12-17 DIAGNOSIS — K029 Dental caries, unspecified: Secondary | ICD-10-CM | POA: Insufficient documentation

## 2012-12-17 DIAGNOSIS — Z792 Long term (current) use of antibiotics: Secondary | ICD-10-CM | POA: Insufficient documentation

## 2012-12-17 DIAGNOSIS — K089 Disorder of teeth and supporting structures, unspecified: Secondary | ICD-10-CM | POA: Insufficient documentation

## 2012-12-17 DIAGNOSIS — F17200 Nicotine dependence, unspecified, uncomplicated: Secondary | ICD-10-CM | POA: Insufficient documentation

## 2012-12-17 DIAGNOSIS — J45901 Unspecified asthma with (acute) exacerbation: Secondary | ICD-10-CM | POA: Insufficient documentation

## 2012-12-17 DIAGNOSIS — K0889 Other specified disorders of teeth and supporting structures: Secondary | ICD-10-CM

## 2012-12-17 DIAGNOSIS — K002 Abnormalities of size and form of teeth: Secondary | ICD-10-CM | POA: Insufficient documentation

## 2012-12-17 DIAGNOSIS — Z79899 Other long term (current) drug therapy: Secondary | ICD-10-CM | POA: Insufficient documentation

## 2012-12-17 MED ORDER — ALBUTEROL SULFATE HFA 108 (90 BASE) MCG/ACT IN AERS
2.0000 | INHALATION_SPRAY | Freq: Once | RESPIRATORY_TRACT | Status: AC
  Administered 2012-12-17: 2 via RESPIRATORY_TRACT
  Filled 2012-12-17: qty 6.7

## 2012-12-17 MED ORDER — ALBUTEROL SULFATE HFA 108 (90 BASE) MCG/ACT IN AERS: 2.0000 | INHALATION_SPRAY | RESPIRATORY_TRACT | Status: DC | PRN

## 2012-12-17 MED ORDER — PENICILLIN V POTASSIUM 500 MG PO TABS: 500.0000 mg | ORAL_TABLET | Freq: Four times a day (QID) | ORAL | Status: DC

## 2012-12-17 MED ORDER — ALBUTEROL SULFATE (5 MG/ML) 0.5% IN NEBU
2.5000 mg | INHALATION_SOLUTION | Freq: Once | RESPIRATORY_TRACT | Status: AC
  Administered 2012-12-17: 2.5 mg via RESPIRATORY_TRACT
  Filled 2012-12-17: qty 0.5

## 2012-12-17 MED ORDER — IPRATROPIUM BROMIDE 0.02 % IN SOLN
0.5000 mg | Freq: Once | RESPIRATORY_TRACT | Status: AC
  Administered 2012-12-17: 0.5 mg via RESPIRATORY_TRACT
  Filled 2012-12-17: qty 2.5

## 2012-12-17 MED ORDER — DEXAMETHASONE SODIUM PHOSPHATE 10 MG/ML IJ SOLN
10.0000 mg | Freq: Once | INTRAMUSCULAR | Status: AC
  Administered 2012-12-17: 10 mg via INTRAMUSCULAR
  Filled 2012-12-17: qty 1

## 2012-12-17 MED ORDER — HYDROCODONE-ACETAMINOPHEN 5-325 MG PO TABS: 1.0000 | ORAL_TABLET | ORAL | Status: DC | PRN
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
## 2012-12-17 NOTE — ED Provider Notes (Signed)
CSN: [id number]     Arrival date & time 12/17/12  2051 History   First MD Initiated Contact with Patient 12/17/12 2148     Chief Complaint  Patient presents with  . Shortness of Breath  . Jaw Pain   (Consider location/radiation/quality/duration/timing/severity/associated sxs/prior Treatment) HPI Comments: Patient is a 27 yo M PMHx significant for asthma presenting to the ED for two complaints. First complaining of 2 days of progressively worsening shortness of breath with associated minimally productive cough and wheezing. Patient states his shortness of breath worsened this evening and he has children in their trick-or-treating. He denies any alleviating factors, but states he does not have any more inhaler to use. Patient states he has been hospitalized for an asthma exacerbation back in 2011 requiring intubation. Patient's second complaint is gradually worsening moderate to severe right-sided lower dental pain with radiation to the right side of his face. Patient states this pain began one to 2 weeks all without precipitating event. He describes it as a constant sharp pain worsened with eating and drinking, cold air. No alleviating factors.  Patient is a 27 y.o. male presenting with shortness of breath.  Shortness of Breath Associated symptoms: cough and wheezing   Associated symptoms: no chest pain, no ear pain, no fever and no sore throat     Past Medical History  Diagnosis Date  . Asthma    History reviewed. No pertinent past surgical history. History reviewed. No pertinent family history. History  Substance Use Topics  . Smoking status: Current Every Day Smoker -- 0.02 packs/day    Types: Cigarettes  . Smokeless tobacco: Never Used  . Alcohol Use: No    Review of Systems  Constitutional: Negative for fever and chills.  HENT: Positive for dental problem, postnasal drip and rhinorrhea. Negative for congestion, drooling, ear discharge, ear pain, facial swelling, hearing loss,  mouth sores, nosebleeds, sinus pressure, sneezing, sore throat, tinnitus, trouble swallowing and voice change.   Respiratory: Positive for cough, shortness of breath and wheezing.   Cardiovascular: Negative for chest pain.  All other systems reviewed and are negative.    Allergies  Review of patient's allergies indicates no known allergies.  Home Medications   Current Outpatient Rx  Name  Route  Sig  Dispense  Refill  . albuterol (PROVENTIL HFA;VENTOLIN HFA) 108 (90 BASE) MCG/ACT inhaler   Inhalation   Inhale 2 puffs into the lungs every 4 (four) hours as needed for wheezing or shortness of breath.   1 Inhaler   0   . albuterol (PROVENTIL HFA;VENTOLIN HFA) 108 (90 BASE) MCG/ACT inhaler   Inhalation   Inhale 2 puffs into the lungs every 4 (four) hours as needed for wheezing or shortness of breath.   1 Inhaler   1   . HYDROcodone-acetaminophen (NORCO/VICODIN) 5-325 MG per tablet   Oral   Take 1 tablet by mouth every 4 (four) hours as needed for pain.   12 tablet   0   . penicillin v potassium (VEETID) 500 MG tablet   Oral   Take 1 tablet (500 mg total) by mouth 4 (four) times daily.   40 tablet   0    BP 141/81  Pulse 61  Temp(Src) 98.3 F (36.8 C) (Oral)  Resp 16  Wt 190 lb 6 oz (86.354 kg)  SpO2 98% Physical Exam  Constitutional: He is oriented to person, place, and time. He appears well-developed and well-nourished. No distress.  Talking in complete sentences  HENT:  Head: Normocephalic  and atraumatic.  Right Ear: External ear normal.  Left Ear: External ear normal.  Nose: Nose normal.  Mouth/Throat: Uvula is midline, oropharynx is clear and moist and mucous membranes are normal. He does not have dentures. No oral lesions. No trismus in the jaw. Abnormal dentition. Dental caries present. No dental abscesses, uvula swelling or lacerations. No posterior oropharyngeal edema or tonsillar abscesses.    Eyes: Conjunctivae are normal.  Neck: Normal range of motion.  Neck supple.  Pulmonary/Chest: Effort normal. No accessory muscle usage. No apnea and not tachypneic. No respiratory distress. He has wheezes. He has no rhonchi. He has no rales. He exhibits no tenderness.  Abdominal: Soft. There is no tenderness.  Musculoskeletal: Normal range of motion. He exhibits no edema.  Lymphadenopathy:    He has no cervical adenopathy.  Neurological: He is alert and oriented to person, place, and time. No cranial nerve deficit.  Skin: Skin is warm and dry. He is not diaphoretic.    ED Course  Procedures (including critical care time) Medications  albuterol (PROVENTIL) (5 MG/ML) 0.5% nebulizer solution 2.5 mg (2.5 mg Nebulization Given 12/17/12 2217)  ipratropium (ATROVENT) nebulizer solution 0.5 mg (0.5 mg Nebulization Given 12/17/12 2217)  albuterol (PROVENTIL HFA;VENTOLIN HFA) 108 (90 BASE) MCG/ACT inhaler 2 puff (2 puffs Inhalation Given 12/17/12 2232)  dexamethasone (DECADRON) injection 10 mg (10 mg Intramuscular Given 12/17/12 2213)    Labs Review Labs Reviewed - No data to display Imaging Review No results found.  EKG Interpretation   None       MDM   1. Asthma exacerbation   2. Pain, dental     Afebrile, NAD, non-toxic appearing, AAOx4.   1) Asthma Exacerbation: Patient ambulated in ED with O2 saturations maintained >90, no current signs of respiratory distress. Lung exam improved after nebulizer treatment. Prednisone given in the ED and pt will bd dc with 5 day burst. Pt states they are breathing at baseline. Pt has been instructed to continue using prescribed medications and to speak with PCP about today's exacerbation.    2) Dental pain: Patient with toothache.  No gross abscess.  Exam unconcerning for Ludwig's angina or spread of infection.  Will treat with penicillin and pain medicine.  Urged patient to follow-up with dentist.     Precautions discussed. Patient agreeable to plan. Patient stable at time of discharge. Discussed patient's  care with Dr. Deretha Emory, who agrees with my plan.   Lise Auer Silas Sedam, PA-C 12/18/12 0028

## 2012-12-19 NOTE — ED Provider Notes (Signed)
Medical screening examination/treatment/procedure(s) were performed by non-physician practitioner and as supervising physician I was immediately available for consultation/collaboration.  EKG Interpretation   None         Shelda Jakes, MD 12/19/12 [phone]

## 2013-01-29 ENCOUNTER — Encounter (HOSPITAL_COMMUNITY): Payer: Self-pay | Admitting: Emergency Medicine

## 2013-01-29 ENCOUNTER — Emergency Department (HOSPITAL_COMMUNITY): Payer: Medicaid Other

## 2013-01-29 ENCOUNTER — Emergency Department (HOSPITAL_COMMUNITY)
Admission: EM | Admit: 2013-01-29 | Discharge: 2013-01-29 | Disposition: A | Discharge status: Home / Self Care | Payer: Medicaid Other | Attending: Emergency Medicine | Admitting: Emergency Medicine

## 2013-01-29 DIAGNOSIS — Y92410 Unspecified street and highway as the place of occurrence of the external cause: Secondary | ICD-10-CM | POA: Insufficient documentation

## 2013-01-29 DIAGNOSIS — J45909 Unspecified asthma, uncomplicated: Secondary | ICD-10-CM | POA: Insufficient documentation

## 2013-01-29 DIAGNOSIS — Y9389 Activity, other specified: Secondary | ICD-10-CM | POA: Diagnosis not present

## 2013-01-29 DIAGNOSIS — S20211A Contusion of right front wall of thorax, initial encounter: Secondary | ICD-10-CM

## 2013-01-29 DIAGNOSIS — F17200 Nicotine dependence, unspecified, uncomplicated: Secondary | ICD-10-CM | POA: Insufficient documentation

## 2013-01-29 DIAGNOSIS — S298XXA Other specified injuries of thorax, initial encounter: Secondary | ICD-10-CM | POA: Diagnosis present

## 2013-01-29 DIAGNOSIS — S20219A Contusion of unspecified front wall of thorax, initial encounter: Secondary | ICD-10-CM | POA: Insufficient documentation

## 2013-01-29 MED ORDER — IBUPROFEN 200 MG PO TABS
600.0000 mg | ORAL_TABLET | Freq: Once | ORAL | Status: AC
  Administered 2013-01-29: 600 mg via ORAL
  Filled 2013-01-29: qty 3

## 2013-01-29 MED ORDER — IBUPROFEN 600 MG PO TABS: 600.0000 mg | ORAL_TABLET | Freq: Three times a day (TID) | ORAL | Status: DC
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
## 2013-01-29 NOTE — ED Notes (Signed)
Pt sleeping soundly upon entering room. Pt medicated for pain. Awaiting disposition.

## 2013-01-29 NOTE — ED Provider Notes (Signed)
Medical screening examination/treatment/procedure(s) were performed by non-physician practitioner and as supervising physician I was immediately available for consultation/collaboration.  Lyanne Co, MD 01/29/13 [phone]
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
## 2013-04-27 ENCOUNTER — Emergency Department (HOSPITAL_COMMUNITY): Payer: Medicaid Other

## 2013-04-27 ENCOUNTER — Encounter (HOSPITAL_COMMUNITY): Payer: Self-pay | Admitting: Emergency Medicine

## 2013-04-27 ENCOUNTER — Emergency Department (HOSPITAL_COMMUNITY)
Admission: EM | Admit: 2013-04-27 | Discharge: 2013-04-27 | Disposition: A | Discharge status: Home / Self Care | Payer: Medicaid Other | Attending: Emergency Medicine | Admitting: Emergency Medicine

## 2013-04-27 DIAGNOSIS — F17200 Nicotine dependence, unspecified, uncomplicated: Secondary | ICD-10-CM | POA: Insufficient documentation

## 2013-04-27 DIAGNOSIS — S0003XA Contusion of scalp, initial encounter: Secondary | ICD-10-CM | POA: Insufficient documentation

## 2013-04-27 DIAGNOSIS — IMO0002 Reserved for concepts with insufficient information to code with codable children: Secondary | ICD-10-CM | POA: Insufficient documentation

## 2013-04-27 DIAGNOSIS — J45909 Unspecified asthma, uncomplicated: Secondary | ICD-10-CM | POA: Insufficient documentation

## 2013-04-27 DIAGNOSIS — S0083XA Contusion of other part of head, initial encounter: Secondary | ICD-10-CM

## 2013-04-27 DIAGNOSIS — S6990XA Unspecified injury of unspecified wrist, hand and finger(s), initial encounter: Secondary | ICD-10-CM | POA: Insufficient documentation

## 2013-04-27 DIAGNOSIS — S4980XA Other specified injuries of shoulder and upper arm, unspecified arm, initial encounter: Secondary | ICD-10-CM | POA: Insufficient documentation

## 2013-04-27 DIAGNOSIS — S1093XA Contusion of unspecified part of neck, initial encounter: Principal | ICD-10-CM

## 2013-04-27 DIAGNOSIS — S46909A Unspecified injury of unspecified muscle, fascia and tendon at shoulder and upper arm level, unspecified arm, initial encounter: Secondary | ICD-10-CM | POA: Insufficient documentation

## 2013-04-27 DIAGNOSIS — S6980XA Other specified injuries of unspecified wrist, hand and finger(s), initial encounter: Secondary | ICD-10-CM | POA: Insufficient documentation

## 2013-04-27 MED ORDER — IBUPROFEN 600 MG PO TABS: 600.0000 mg | ORAL_TABLET | Freq: Four times a day (QID) | ORAL | Status: DC | PRN

## 2013-04-27 MED ORDER — HYDROCODONE-ACETAMINOPHEN 5-325 MG PO TABS: 1.0000 | ORAL_TABLET | ORAL | Status: DC | PRN
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
## 2013-04-27 NOTE — ED Notes (Addendum)
Initially charted on incorrect patient.  Removed error.

## 2013-04-27 NOTE — ED Notes (Signed)
Patient is transported to CT.  
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
## 2013-04-27 NOTE — ED Notes (Signed)
Patient reports he took a shot of vodka around 1 or 2 am for pain management.
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
## 2013-04-27 NOTE — ED Provider Notes (Signed)
Medical screening examination/treatment/procedure(s) were performed by non-physician practitioner and as supervising physician I was immediately available for consultation/collaboration.   EKG Interpretation None        Darrelle Wiberg, MD 04/27/13 0624 

## 2013-07-06 ENCOUNTER — Emergency Department (HOSPITAL_COMMUNITY)
Admission: EM | Admit: 2013-07-06 | Discharge: 2013-07-06 | Disposition: A | Discharge status: Home / Self Care | Payer: Medicaid Other | Attending: Emergency Medicine | Admitting: Emergency Medicine

## 2013-07-06 ENCOUNTER — Encounter (HOSPITAL_COMMUNITY): Payer: Self-pay | Admitting: Emergency Medicine

## 2013-07-06 DIAGNOSIS — J45901 Unspecified asthma with (acute) exacerbation: Secondary | ICD-10-CM | POA: Insufficient documentation

## 2013-07-06 DIAGNOSIS — F17200 Nicotine dependence, unspecified, uncomplicated: Secondary | ICD-10-CM | POA: Insufficient documentation

## 2013-07-06 MED ORDER — ALBUTEROL SULFATE HFA 108 (90 BASE) MCG/ACT IN AERS: 2.0000 | INHALATION_SPRAY | RESPIRATORY_TRACT | Status: DC | PRN

## 2013-07-06 MED ORDER — ALBUTEROL SULFATE HFA 108 (90 BASE) MCG/ACT IN AERS
2.0000 | INHALATION_SPRAY | Freq: Once | RESPIRATORY_TRACT | Status: AC
  Administered 2013-07-06: 2 via RESPIRATORY_TRACT
  Filled 2013-07-06: qty 6.7

## 2013-07-06 NOTE — ED Notes (Signed)
Bed: WA25 Expected date:  Expected time:  Means of arrival:  Comments: EMS-asthma 

## 2013-07-06 NOTE — ED Provider Notes (Signed)
CSN: [id number]     Arrival date & time 07/06/13  1012 History   First MD Initiated Contact with Patient 07/06/13 1013     Chief Complaint  Patient presents with  . Asthma     HPI Patient reports he was playing inside today when his asthma became exacerbated.  He did not have his inhaler.  EMS was called.  He received a breathing treatment.  He feels much better at this time.  No recent cough or congestion.  Fevers or chills.  No chest pain.  No back pain.  No other complaints.   Past Medical History  Diagnosis Date  . Asthma    No past surgical history on file. No family history on file. History  Substance Use Topics  . Smoking status: Current Every Day Smoker -- 0.02 packs/day    Types: Cigarettes  . Smokeless tobacco: Never Used  . Alcohol Use: Yes     Comment: occasional    Review of Systems  All other systems reviewed and are negative.     Allergies  No known allergies  Home Medications   Prior to Admission medications   Medication Sig Start Date End Date Taking? Authorizing Provider  acetaminophen (TYLENOL) 325 MG tablet Take 650 mg by mouth every 6 (six) hours as needed for mild pain.   Yes Historical Provider, MD  ibuprofen (ADVIL,MOTRIN) 200 MG tablet Take 400 mg by mouth every 6 (six) hours as needed for mild pain.   Yes Historical Provider, MD   BP 129/62  Pulse 66  Temp(Src) 97.7 F (36.5 C) (Oral)  Resp 18  SpO2 96% Physical Exam  Nursing note and vitals reviewed. Constitutional: He is oriented to person, place, and time. He appears well-developed and well-nourished.  HENT:  Head: Normocephalic and atraumatic.  Eyes: EOM are normal.  Neck: Normal range of motion.  Cardiovascular: Normal rate, regular rhythm, normal heart sounds and intact distal pulses.   Pulmonary/Chest: Effort normal and breath sounds normal. No respiratory distress. He has no wheezes.  Abdominal: Soft. He exhibits no distension. There is no tenderness.  Musculoskeletal:  Normal range of motion.  Neurological: He is alert and oriented to person, place, and time.  Skin: Skin is warm and dry.  Psychiatric: He has a normal mood and affect. Judgment normal.    ED Course  Procedures (including critical care time) Labs Review Labs Reviewed - No data to display  Imaging Review No results found.   EKG Interpretation None      MDM   Final diagnoses:  Asthma exacerbation   10:32 AM Patient feels better at this time.  Home with an albuterol inhaler.  Discharge home in good condition.  Transient exacerbation.  I don't believe he would benefit from steroids at this time     Lyanne CoKevin M Magaby Rumberger, MD 07/06/13 [phone]

## 2013-07-06 NOTE — ED Notes (Signed)
He was painting inside today when he began to experience wheezing and asthma s/s.  EMS gave him a duoneb en route to hospital.  Currently, he is in no distress and is breathing normally.

## 2013-07-06 NOTE — Discharge Instructions (Signed)

## 2013-11-14 ENCOUNTER — Encounter (HOSPITAL_COMMUNITY): Payer: Self-pay | Admitting: Emergency Medicine

## 2013-11-14 ENCOUNTER — Emergency Department (HOSPITAL_COMMUNITY)
Admission: EM | Admit: 2013-11-14 | Discharge: 2013-11-14 | Disposition: A | Discharge status: Home / Self Care | Payer: Worker's Compensation | Attending: Emergency Medicine | Admitting: Emergency Medicine

## 2013-11-14 ENCOUNTER — Emergency Department (HOSPITAL_COMMUNITY): Payer: Worker's Compensation

## 2013-11-14 DIAGNOSIS — Y9289 Other specified places as the place of occurrence of the external cause: Secondary | ICD-10-CM | POA: Diagnosis not present

## 2013-11-14 DIAGNOSIS — S99921A Unspecified injury of right foot, initial encounter: Secondary | ICD-10-CM

## 2013-11-14 DIAGNOSIS — S8990XA Unspecified injury of unspecified lower leg, initial encounter: Secondary | ICD-10-CM | POA: Diagnosis present

## 2013-11-14 DIAGNOSIS — Z79899 Other long term (current) drug therapy: Secondary | ICD-10-CM | POA: Diagnosis not present

## 2013-11-14 DIAGNOSIS — Y990 Civilian activity done for income or pay: Secondary | ICD-10-CM | POA: Diagnosis not present

## 2013-11-14 DIAGNOSIS — S91109A Unspecified open wound of unspecified toe(s) without damage to nail, initial encounter: Secondary | ICD-10-CM | POA: Diagnosis not present

## 2013-11-14 DIAGNOSIS — W208XXA Other cause of strike by thrown, projected or falling object, initial encounter: Secondary | ICD-10-CM | POA: Insufficient documentation

## 2013-11-14 DIAGNOSIS — F17200 Nicotine dependence, unspecified, uncomplicated: Secondary | ICD-10-CM | POA: Insufficient documentation

## 2013-11-14 DIAGNOSIS — S99919A Unspecified injury of unspecified ankle, initial encounter: Secondary | ICD-10-CM | POA: Diagnosis present

## 2013-11-14 DIAGNOSIS — J45909 Unspecified asthma, uncomplicated: Secondary | ICD-10-CM | POA: Insufficient documentation

## 2013-11-14 DIAGNOSIS — S99929A Unspecified injury of unspecified foot, initial encounter: Secondary | ICD-10-CM

## 2013-11-14 MED ORDER — HYDROCODONE-ACETAMINOPHEN 5-325 MG PO TABS: 1.0000 | ORAL_TABLET | ORAL | Status: DC | PRN

## 2013-11-14 MED ORDER — OXYCODONE-ACETAMINOPHEN 5-325 MG PO TABS
1.0000 | ORAL_TABLET | Freq: Once | ORAL | Status: AC
  Administered 2013-11-14: 1 via ORAL
  Filled 2013-11-14: qty 1

## 2013-11-14 NOTE — ED Provider Notes (Signed)
CSN: [id number]     Arrival date & time 11/14/13  1654 History  This chart was scribed for Harle Battiest, NP working with Hurman Horn, MD by Evon Slack, ED Scribe. This patient was seen in room TR10C/TR10C and the patient's care was started at 6:04 PM.    Chief Complaint  Patient presents with  . Foot Pain  . Toe Injury  . Laceration   Patient is a 28 y.o. male presenting with lower extremity pain and skin laceration. The history is provided by the patient. No language interpreter was used.  Foot Pain  Laceration  HPI Comments: Henry Hardin is a 28 y.o. male who presents to the Emergency Department complaining of left foot injury onset 1:30 PM. He states that he had about 200 pounds of steel drop on to his foot. He has associated aching foot pain and small laceration to the top of the left great toe. He states he is also having trouble with his gait due to the pain. He states that his pain is 5/10.   Past Medical History  Diagnosis Date  . Asthma    History reviewed. No pertinent past surgical history. No family history on file. History  Substance Use Topics  . Smoking status: Current Every Day Smoker -- 0.02 packs/day    Types: Cigarettes  . Smokeless tobacco: Never Used  . Alcohol Use: Yes     Comment: occasional    Review of Systems  Musculoskeletal: Positive for arthralgias and gait problem.  Skin: Positive for wound.    Allergies  No known allergies  Home Medications   Prior to Admission medications   Medication Sig Start Date End Date Taking? Authorizing Provider  acetaminophen (TYLENOL) 325 MG tablet Take 650 mg by mouth every 6 (six) hours as needed for mild pain.   Yes Historical Provider, MD  albuterol (PROVENTIL HFA;VENTOLIN HFA) 108 (90 BASE) MCG/ACT inhaler Inhale 2 puffs into the lungs every 4 (four) hours as needed for wheezing or shortness of breath. 07/06/13  Yes Lyanne Co, MD  ibuprofen (ADVIL,MOTRIN) 200 MG tablet Take 400 mg by  mouth every 6 (six) hours as needed for mild pain.   Yes Historical Provider, MD   Triage Vitals: BP 130/69  Pulse 62  Temp(Src) 99.1 F (37.3 C) (Oral)  Resp 18  Ht  (1.88 m)  Wt 187 lb 9.6 oz (85.095 kg)  BMI 24.08 kg/m2  SpO2 100%  Physical Exam  Nursing note and vitals reviewed. Constitutional: He is oriented to person, place, and time. He appears well-developed and well-nourished. No distress.  HENT:  Head: Normocephalic and atraumatic.  Eyes: Conjunctivae are normal.  Neck: Neck supple.  Cardiovascular: Normal rate.   Pulmonary/Chest: Effort normal. No respiratory distress.  Musculoskeletal: Normal range of motion.       Right foot: He exhibits tenderness. He exhibits no swelling and no deformity.       Feet:  Neurological: He is alert and oriented to person, place, and time.  5/5 strength with plantar and dorsi flexion  Skin: Skin is warm and dry.  Psychiatric: He has a normal mood and affect. His behavior is normal.    ED Course  Procedures (including critical care time) DIAGNOSTIC STUDIES: Oxygen Saturation is 100% on RA, normal by my interpretation.    COORDINATION OF CARE: 6:34 PM-Discussed treatment plan which includes pain medication and wound care with pt at bedside and pt agreed to plan.     Labs Review Labs  Reviewed - No data to display  Imaging Review Dg Foot Complete Left  11/14/2013   CLINICAL DATA:  Laceration to great toe  EXAM: LEFT FOOT - COMPLETE 3+ VIEW  COMPARISON:  None.  FINDINGS: No fracture or dislocation is seen.  Mild to moderate degenerative changes of the 1st MTP joint with hallux valgus deformity.  The visualized soft tissues are unremarkable.  No radiopaque foreign body is seen.  IMPRESSION: No fracture, dislocation, or radiopaque foreign body is seen.   Electronically Signed   By: Charline Bills M.D.   On: 11/14/2013 18:26     EKG Interpretation None      MDM   Final diagnoses:  Foot injury, right, initial encounter    28 yo male presents with foot pain after occupational injury.  His x-ray is negative for obvious fracture or dislocation.  There is a small skin tear, that does not require suturing, wound care and bandage applied. Pain managed in ED.  Pt able to ambulate in the ED with wooden shoe. Discharge instructions include symptomatic mgmt, referral to orthopedics for follow up if symptoms persist for possibility of missed fracture diagnosis. Pt is agreeable with above plan.  Return precautions provided. RN and lab will assist with worker's comp process.  I personally performed the services described in this documentation, which was scribed in my presence. The recorded information has been reviewed and is accurate.  Filed Vitals:   11/14/13 1728 11/14/13 1949  BP: 130/69 126/76  Pulse: 62 61  Temp: 99.1 F (37.3 C) 98.3 F (36.8 C)  TempSrc: Oral Oral  Resp: 18   Height:  (1.88 m)   Weight: 187 lb 9.6 oz (85.095 kg)   SpO2: 100% 99%   Meds given in ED:  Medications  oxyCODONE-acetaminophen (PERCOCET/ROXICET) 5-325 MG per tablet 1 tablet (1 tablet Oral Given 11/14/13 1849)    Discharge Medication List as of 11/14/2013  6:54 PM    START taking these medications   Details  HYDROcodone-acetaminophen (NORCO/VICODIN) 5-325 MG per tablet Take 1-2 tablets by mouth every 4 (four) hours as needed for moderate pain or severe pain., Starting 11/14/2013, Until Discontinued, Print                Harle Battiest, NP 11/19/13 1125

## 2013-11-14 NOTE — ED Notes (Signed)
Patient discharged with all personal belongings. 

## 2013-11-14 NOTE — Discharge Instructions (Signed)
Please be sure to follow the directions provided.  Be sure to follow up with the ortho doctor regarding your injury.  You may take ibuprofen 400 mg by mouth every 6 hours for pain.    SEEK IMMEDIATE MEDICAL CARE IF:  You have increased redness, swelling, or pain in your foot.  Your swelling or pain is not relieved with medicines.  You have loss of feeling in your foot or are unable to move your toes.  Your foot turns cold or blue.  You have pain when you move your toes.  Your foot becomes warm to the touch.  Your contusion does not improve in 2 days.

## 2013-11-14 NOTE — ED Notes (Signed)
Pt here for injury to left foot and toes. Had a box of steel roll over his foot earlier this afternoon. Has a laceration to left hand also.

## 2013-11-14 NOTE — Progress Notes (Signed)
Orthopedic Tech Progress Note Patient Details:  Henry Hardin 13-Apr-1985 [id number]  Ortho Devices Type of Ortho Device: Postop shoe/boot Ortho Device/Splint Location: LLE Ortho Device/Splint Interventions: Ordered;Application   Jennye Moccasin 11/14/2013, 7:04 PM

## 2013-11-22 ENCOUNTER — Emergency Department (HOSPITAL_COMMUNITY)
Admission: EM | Admit: 2013-11-22 | Discharge: 2013-11-22 | Disposition: A | Discharge status: Home / Self Care | Payer: Self-pay | Attending: Emergency Medicine | Admitting: Emergency Medicine

## 2013-11-22 ENCOUNTER — Encounter (HOSPITAL_COMMUNITY): Payer: Self-pay | Admitting: Emergency Medicine

## 2013-11-22 DIAGNOSIS — Z79899 Other long term (current) drug therapy: Secondary | ICD-10-CM | POA: Insufficient documentation

## 2013-11-22 DIAGNOSIS — R0789 Other chest pain: Secondary | ICD-10-CM | POA: Insufficient documentation

## 2013-11-22 DIAGNOSIS — Z7952 Long term (current) use of systemic steroids: Secondary | ICD-10-CM | POA: Insufficient documentation

## 2013-11-22 DIAGNOSIS — J4521 Mild intermittent asthma with (acute) exacerbation: Secondary | ICD-10-CM | POA: Insufficient documentation

## 2013-11-22 DIAGNOSIS — Z720 Tobacco use: Secondary | ICD-10-CM | POA: Insufficient documentation

## 2013-11-22 MED ORDER — ALBUTEROL SULFATE HFA 108 (90 BASE) MCG/ACT IN AERS: 2.0000 | INHALATION_SPRAY | RESPIRATORY_TRACT | Status: DC | PRN

## 2013-11-22 MED ORDER — ALBUTEROL SULFATE HFA 108 (90 BASE) MCG/ACT IN AERS
2.0000 | INHALATION_SPRAY | RESPIRATORY_TRACT | Status: DC | PRN
  Administered 2013-11-22: 2 via RESPIRATORY_TRACT
  Filled 2013-11-22: qty 6.7

## 2013-11-22 MED ORDER — PREDNISONE 20 MG PO TABS: 60.0000 mg | ORAL_TABLET | Freq: Every day | ORAL | Status: DC

## 2013-11-22 NOTE — ED Provider Notes (Signed)
CSN: [id number]     Arrival date & time 11/22/13  0708 History   First MD Initiated Contact with Patient 11/22/13 [phone]     Chief Complaint  Patient presents with  . Shortness of Breath     (Consider location/radiation/quality/duration/timing/severity/associated sxs/prior Treatment) HPI  This is a 28 year old male with a history of asthma who presents by EMS with shortness of breath. Patient reports one-day history of worsening shortness of breath and chest tightness. He is out of his rescue inhaler at home. Last asthma exacerbation was approximately one month ago. He denies any fevers. He does endorse a nonproductive cough and chest tightness which improved with albuterol en route. Patient was also given Solu-Medrol in route. Currently satting 100% on room air.  Past Medical History  Diagnosis Date  . Asthma    History reviewed. No pertinent past surgical history. History reviewed. No pertinent family history. History  Substance Use Topics  . Smoking status: Current Every Day Smoker -- 0.02 packs/day    Types: Cigarettes  . Smokeless tobacco: Never Used  . Alcohol Use: Yes     Comment: occasional    Review of Systems  Constitutional: Negative.  Negative for fever.  Respiratory: Positive for cough, chest tightness and shortness of breath.   Cardiovascular: Negative.  Negative for chest pain.  Gastrointestinal: Negative.  Negative for abdominal pain.  Genitourinary: Negative.  Negative for dysuria.  All other systems reviewed and are negative.     Allergies  No known allergies  Home Medications   Prior to Admission medications   Medication Sig Start Date End Date Taking? Authorizing Provider  acetaminophen (TYLENOL) 325 MG tablet Take 650 mg by mouth every 6 (six) hours as needed for mild pain.   Yes Historical Provider, MD  albuterol (PROVENTIL HFA;VENTOLIN HFA) 108 (90 BASE) MCG/ACT inhaler Inhale 2 puffs into the lungs every 4 (four) hours as needed for wheezing or  shortness of breath. 07/06/13  Yes Lyanne CoKevin M Campos, MD  ibuprofen (ADVIL,MOTRIN) 200 MG tablet Take 400 mg by mouth every 6 (six) hours as needed for mild pain.   Yes Historical Provider, MD  albuterol (PROVENTIL HFA;VENTOLIN HFA) 108 (90 BASE) MCG/ACT inhaler Inhale 2 puffs into the lungs every 4 (four) hours as needed for wheezing or shortness of breath. 11/22/13   Shon Batonourtney F Janssen Zee, MD  HYDROcodone-acetaminophen (NORCO/VICODIN) 5-325 MG per tablet Take 1-2 tablets by mouth every 4 (four) hours as needed for moderate pain or severe pain. 11/14/13   Harle BattiestElizabeth Tysinger, NP  predniSONE (DELTASONE) 20 MG tablet Take 3 tablets (60 mg total) by mouth daily. 11/22/13   Shon Batonourtney F Cortavius Montesinos, MD   BP 105/68  Pulse 59  Temp(Src) 98 F (36.7 C)  Resp 18  SpO2 96% Physical Exam  Nursing note and vitals reviewed. Constitutional: He is oriented to person, place, and time. He appears well-developed and well-nourished. No distress.  HENT:  Head: Normocephalic and atraumatic.  Eyes: Pupils are equal, round, and reactive to light.  Cardiovascular: Normal rate, regular rhythm and normal heart sounds.   No murmur heard. Pulmonary/Chest: Effort normal and breath sounds normal. No respiratory distress. He has no wheezes. He exhibits no tenderness.  Musculoskeletal: He exhibits no edema.  Neurological: He is alert and oriented to person, place, and time.  Skin: Skin is warm and dry.  Psychiatric: He has a normal mood and affect.    ED Course  Procedures (including critical care time) Labs Review Labs Reviewed - No data to display  Imaging Review No results found.   EKG Interpretation None      MDM   Final diagnoses:  Asthma, mild intermittent, with acute exacerbation    Patient presents with shortness of breath. Improved after steroids and albuterol by EMS. On my exam he is satting 100% on room air and is moving good air without wheezing. Continues to endorse and chest tightness. HFA inhaler ordered  for the patient. No fevers or systemic symptoms suggestive of pneumonia. Most consistent with acute asthma exacerbation.  After one hour, repeat exam continues to be reassuring. Patient ambulated with pulse ox and maintained oxygen saturations. Will place on a burst dose steroids.  Patient given followup at [facility]. Encouraged to stop smoking.  After history, exam, and medical workup I feel the patient has been appropriately medically screened and is safe for discharge home. Pertinent diagnoses were discussed with the patient. Patient was given return precautions.    Shon Baton, MD 11/22/13 [phone]

## 2013-11-22 NOTE — ED Notes (Signed)
Sats 96% RA while walking in the hallway.

## 2013-11-22 NOTE — Progress Notes (Signed)
[facility] Community Liaison Stacy,  ° °Did not get to see patient but will be sending information on [facility] Card program to help patient establish primary care, using the address provided.  °

## 2013-11-22 NOTE — ED Notes (Signed)
Per EMS: EMS was called due to pt experiencing SOB.  Pt is currently out of his normal rescue inhaler.  En route pt was given 10mg  Albuterol, 0.5 Atrovent and 125mg  solumedrol.  Vitals: BP 130/102, 100% on RA, HR 60, Resp 16

## 2013-11-22 NOTE — Discharge Instructions (Signed)

## 2013-11-22 NOTE — ED Notes (Signed)
Bed: WA06 Expected date:  Expected time:  Means of arrival:  Comments: EMS/28 yo asthma/neb treatment

## 2013-11-23 NOTE — ED Provider Notes (Signed)
Medical screening examination/treatment/procedure(s) were performed by non-physician practitioner and as supervising physician I was immediately available for consultation/collaboration.   EKG Interpretation None       Pradyun Ishman M Rickayla Wieland, MD 11/23/13 2043 

## 2013-12-05 ENCOUNTER — Emergency Department (HOSPITAL_COMMUNITY)
Admission: EM | Admit: 2013-12-05 | Discharge: 2013-12-05 | Disposition: A | Discharge status: Home / Self Care | Payer: Self-pay | Attending: Emergency Medicine | Admitting: Emergency Medicine

## 2013-12-05 ENCOUNTER — Encounter (HOSPITAL_COMMUNITY): Payer: Self-pay | Admitting: Emergency Medicine

## 2013-12-05 DIAGNOSIS — J45901 Unspecified asthma with (acute) exacerbation: Secondary | ICD-10-CM | POA: Insufficient documentation

## 2013-12-05 DIAGNOSIS — Z7952 Long term (current) use of systemic steroids: Secondary | ICD-10-CM | POA: Insufficient documentation

## 2013-12-05 DIAGNOSIS — Z79899 Other long term (current) drug therapy: Secondary | ICD-10-CM | POA: Insufficient documentation

## 2013-12-05 DIAGNOSIS — J4521 Mild intermittent asthma with (acute) exacerbation: Secondary | ICD-10-CM

## 2013-12-05 DIAGNOSIS — Z720 Tobacco use: Secondary | ICD-10-CM | POA: Insufficient documentation

## 2013-12-05 MED ORDER — IPRATROPIUM-ALBUTEROL 0.5-2.5 (3) MG/3ML IN SOLN
3.0000 mL | Freq: Once | RESPIRATORY_TRACT | Status: AC
  Administered 2013-12-05: 3 mL via RESPIRATORY_TRACT
  Filled 2013-12-05: qty 3

## 2013-12-05 MED ORDER — ALBUTEROL SULFATE (2.5 MG/3ML) 0.083% IN NEBU
2.5000 mg | INHALATION_SOLUTION | Freq: Once | RESPIRATORY_TRACT | Status: AC
  Administered 2013-12-05: 2.5 mg via RESPIRATORY_TRACT
  Filled 2013-12-05: qty 3

## 2013-12-05 MED ORDER — ALBUTEROL SULFATE HFA 108 (90 BASE) MCG/ACT IN AERS
2.0000 | INHALATION_SPRAY | Freq: Once | RESPIRATORY_TRACT | Status: AC
  Administered 2013-12-05: 2 via RESPIRATORY_TRACT
  Filled 2013-12-05: qty 6.7

## 2013-12-05 NOTE — ED Provider Notes (Signed)
CSN: [id number]     Arrival date & time 12/05/13  0208 History   First MD Initiated Contact with Patient 12/05/13 0217     Chief Complaint  Patient presents with  . Asthma     (Consider location/radiation/quality/duration/timing/severity/associated sxs/prior Treatment) Patient is a 28 y.o. male presenting with asthma. The history is provided by the patient (the pt complains of sob).  Asthma This is a new problem. The current episode started 12 to 24 hours ago. The problem occurs constantly. The problem has not changed since onset.Pertinent negatives include no chest pain, no abdominal pain and no headaches. Nothing aggravates the symptoms. Nothing relieves the symptoms.    Past Medical History  Diagnosis Date  . Asthma    History reviewed. No pertinent past surgical history. History reviewed. No pertinent family history. History  Substance Use Topics  . Smoking status: Current Every Day Smoker -- 0.02 packs/day    Types: Cigarettes  . Smokeless tobacco: Never Used  . Alcohol Use: Yes     Comment: occasional    Review of Systems  Constitutional: Negative for appetite change and fatigue.  HENT: Negative for congestion, ear discharge and sinus pressure.   Eyes: Negative for discharge.  Respiratory: Positive for wheezing. Negative for cough.   Cardiovascular: Negative for chest pain.  Gastrointestinal: Negative for abdominal pain and diarrhea.  Genitourinary: Negative for frequency and hematuria.  Musculoskeletal: Negative for back pain.  Skin: Negative for rash.  Neurological: Negative for seizures and headaches.  Psychiatric/Behavioral: Negative for hallucinations.      Allergies  No known allergies  Home Medications   Prior to Admission medications   Medication Sig Start Date End Date Taking? Authorizing Provider  acetaminophen (TYLENOL) 325 MG tablet Take 650 mg by mouth every 6 (six) hours as needed for mild pain.    Historical Provider, MD  albuterol  (PROVENTIL HFA;VENTOLIN HFA) 108 (90 BASE) MCG/ACT inhaler Inhale 2 puffs into the lungs every 4 (four) hours as needed for wheezing or shortness of breath. 07/06/13   Lyanne CoKevin M Campos, MD  albuterol (PROVENTIL HFA;VENTOLIN HFA) 108 (90 BASE) MCG/ACT inhaler Inhale 2 puffs into the lungs every 4 (four) hours as needed for wheezing or shortness of breath. 11/22/13   Shon Batonourtney F Horton, MD  HYDROcodone-acetaminophen (NORCO/VICODIN) 5-325 MG per tablet Take 1-2 tablets by mouth every 4 (four) hours as needed for moderate pain or severe pain. 11/14/13   Harle BattiestElizabeth Tysinger, NP  ibuprofen (ADVIL,MOTRIN) 200 MG tablet Take 400 mg by mouth every 6 (six) hours as needed for mild pain.    Historical Provider, MD  predniSONE (DELTASONE) 20 MG tablet Take 3 tablets (60 mg total) by mouth daily. 11/22/13   Shon Batonourtney F Horton, MD   BP 124/68  Pulse 56  Temp(Src) 98.8 F (37.1 C) (Oral)  Resp 16  Ht 6\' 3"  (1.905 m)  Wt 192 lb (87.091 kg)  BMI 24.00 kg/m2  SpO2 96% Physical Exam  Constitutional: He is oriented to person, place, and time. He appears well-developed.  HENT:  Head: Normocephalic.  Eyes: Conjunctivae and EOM are normal. No scleral icterus.  Neck: Neck supple. No thyromegaly present.  Cardiovascular: Normal rate and regular rhythm.  Exam reveals no gallop and no friction rub.   No murmur heard. Pulmonary/Chest: No stridor. He has wheezes. He has no rales. He exhibits no tenderness.  Abdominal: He exhibits no distension. There is no tenderness. There is no rebound.  Musculoskeletal: Normal range of motion. He exhibits no edema.  Lymphadenopathy:  He has no cervical adenopathy.  Neurological: He is oriented to person, place, and time. He exhibits normal muscle tone. Coordination normal.  Skin: No rash noted. No erythema.  Psychiatric: He has a normal mood and affect. His behavior is normal.    ED Course  Procedures (including critical care time) Labs Review Labs Reviewed - No data to  display  Imaging Review No results found.   EKG Interpretation None      MDM   Final diagnoses:  Asthma, mild intermittent, with acute exacerbation    Pt improved with tx    Benny LennertJoseph L Ziaire Hagos, MD 12/05/13 [phone]

## 2013-12-05 NOTE — Discharge Instructions (Signed)
Follow up as needed

## 2013-12-05 NOTE — ED Notes (Signed)
Patient with history of asthma. Complaining of wheezing and cough today. Reports lost inhaler.

## 2013-12-22 ENCOUNTER — Emergency Department (HOSPITAL_COMMUNITY)
Admission: EM | Admit: 2013-12-22 | Discharge: 2013-12-22 | Disposition: A | Discharge status: Home / Self Care | Payer: Self-pay | Attending: Emergency Medicine | Admitting: Emergency Medicine

## 2013-12-22 ENCOUNTER — Encounter (HOSPITAL_COMMUNITY): Payer: Self-pay | Admitting: Emergency Medicine

## 2013-12-22 DIAGNOSIS — H1033 Unspecified acute conjunctivitis, bilateral: Secondary | ICD-10-CM | POA: Insufficient documentation

## 2013-12-22 DIAGNOSIS — Z79899 Other long term (current) drug therapy: Secondary | ICD-10-CM | POA: Insufficient documentation

## 2013-12-22 DIAGNOSIS — H109 Unspecified conjunctivitis: Secondary | ICD-10-CM

## 2013-12-22 DIAGNOSIS — J4520 Mild intermittent asthma, uncomplicated: Secondary | ICD-10-CM | POA: Insufficient documentation

## 2013-12-22 DIAGNOSIS — Z720 Tobacco use: Secondary | ICD-10-CM | POA: Insufficient documentation

## 2013-12-22 DIAGNOSIS — Z7952 Long term (current) use of systemic steroids: Secondary | ICD-10-CM | POA: Insufficient documentation

## 2013-12-22 MED ORDER — ALBUTEROL SULFATE HFA 108 (90 BASE) MCG/ACT IN AERS
1.0000 | INHALATION_SPRAY | RESPIRATORY_TRACT | Status: DC | PRN
Start: 2013-12-22 — End: 2013-12-22
  Administered 2013-12-22: 2 via RESPIRATORY_TRACT
  Filled 2013-12-22: qty 6.7

## 2013-12-22 MED ORDER — IPRATROPIUM-ALBUTEROL 0.5-2.5 (3) MG/3ML IN SOLN
3.0000 mL | Freq: Once | RESPIRATORY_TRACT | Status: AC
  Administered 2013-12-22: 3 mL via RESPIRATORY_TRACT
  Filled 2013-12-22: qty 3

## 2013-12-22 MED ORDER — POLYMYXIN B-TRIMETHOPRIM 10000-0.1 UNIT/ML-% OP SOLN
1.0000 [drp] | OPHTHALMIC | Status: DC
  Administered 2013-12-22: 1 [drp] via OPHTHALMIC
  Filled 2013-12-22: qty 10

## 2013-12-22 NOTE — Discharge Instructions (Signed)
Take the prescribed medication as directed. Follow-up with your eye doctor if symptoms worsen or begin experiencing trouble with your vision. Return to the ED for new or worsening symptoms.

## 2013-12-22 NOTE — ED Notes (Signed)
Pt c/o shortness of breath and wheezing that just started while he was waiting in the lobby. Inspiratory and expiratory wheezes bilaterally. Pt states " I'm having an asthma attack and I don't have an inhaler".

## 2013-12-22 NOTE — ED Notes (Addendum)
PA  Lisa at bedside. 

## 2013-12-22 NOTE — ED Notes (Signed)
Pt presents by EMS from home with c/o eye irritation x 1 hour hour. EMS states eyes are red and tearing, pt rubbing eyes.

## 2013-12-22 NOTE — ED Notes (Signed)
Pt ambulatory upon DC. He verbalizes that he is leaving with ALL belongings that he arrived with. A bus pass was provided for transportation. Inhaler and eye drops given to patient to take home.

## 2013-12-22 NOTE — ED Provider Notes (Signed)
CSN: [id number]     Arrival date & time 12/22/13  0021 History   First MD Initiated Contact with Patient 12/22/13 [phone]     Chief Complaint  Patient presents with  . Eye Problem  . Shortness of Breath  . Asthma     (Consider location/radiation/quality/duration/timing/severity/associated sxs/prior Treatment) Patient is a 28 y.o. male presenting with eye problem, shortness of breath, and asthma. The history is provided by the patient and medical records.  Eye Problem Associated symptoms: discharge, photophobia and redness   Shortness of Breath Asthma   This is a 28 year old male with past medical history significant for asthma, presenting to the ED for eye irritation for the past hour. Patient states his eyes have been draining purulent material and he feels as if there is a film on top of his eye. Patient denies any trauma to the eye. No chemical or foreign body exposure. Patient wears glasses only. Ophthalmologist is Dr. Pollie MeyerMcIntyre. No recent exposure to any one with similar symptoms.  While in the waiting room patient began complaining of shortness of breath. He was noted to have wheezes at that time by nursing staff. He was given different treatment prior to my evaluation and denies current shortness of breath. No chest pain, cough, fever, or chills.  No known sick contacts.  VS stable on arrival.  Past Medical History  Diagnosis Date  . Asthma    History reviewed. No pertinent past surgical history. No family history on file. History  Substance Use Topics  . Smoking status: Current Every Day Smoker -- 0.02 packs/day    Types: Cigarettes  . Smokeless tobacco: Never Used  . Alcohol Use: Yes     Comment: occasional    Review of Systems  Eyes: Positive for photophobia, pain, discharge and redness.  Respiratory: Positive for shortness of breath.   All other systems reviewed and are negative.     Allergies  No known allergies  Home Medications   Prior to Admission  medications   Medication Sig Start Date End Date Taking? Authorizing Provider  albuterol (PROVENTIL HFA;VENTOLIN HFA) 108 (90 BASE) MCG/ACT inhaler Inhale 2 puffs into the lungs every 4 (four) hours as needed for wheezing or shortness of breath. 07/06/13  Yes Lyanne CoKevin M Campos, MD  acetaminophen (TYLENOL) 325 MG tablet Take 650 mg by mouth every 6 (six) hours as needed for mild pain.    Historical Provider, MD  albuterol (PROVENTIL HFA;VENTOLIN HFA) 108 (90 BASE) MCG/ACT inhaler Inhale 2 puffs into the lungs every 4 (four) hours as needed for wheezing or shortness of breath. 11/22/13   Shon Batonourtney F [place], MD  HYDROcodone-acetaminophen (NORCO/VICODIN) 5-325 MG per tablet Take 1-2 tablets by mouth every 4 (four) hours as needed for moderate pain or severe pain. 11/14/13   Harle BattiestElizabeth Tysinger, NP  ibuprofen (ADVIL,MOTRIN) 200 MG tablet Take 400 mg by mouth every 6 (six) hours as needed for mild pain.    Historical Provider, MD  predniSONE (DELTASONE) 20 MG tablet Take 3 tablets (60 mg total) by mouth daily. 11/22/13   Shon Batonourtney F [place], MD   BP 142/86 mmHg  Pulse 56  Temp(Src) 97.9 F (36.6 C) (Oral)  Resp 16  Ht 6\' 3"  (1.905 m)  Wt 195 lb (88.451 kg)  BMI 24.37 kg/m2  SpO2 95%   Physical Exam  Constitutional: He is oriented to person, place, and time. He appears well-developed and well-nourished. No distress.  HENT:  Head: Normocephalic and atraumatic.  Mouth/Throat: Oropharynx is clear and moist.  Eyes: EOM are normal. Pupils are equal, round, and reactive to light. Right eye exhibits discharge. No foreign body present in the right eye. Left eye exhibits discharge. No foreign body present in the left eye. Right conjunctiva is injected. Right conjunctiva has no hemorrhage. Left conjunctiva is injected. Left conjunctiva has no hemorrhage.  No lid edema or erythema; bilateral conjunctiva injected with discharge noted along lid margins; film present over right eye; EOM intact and non-painful; PERRL; no FB  noted  Neck: Normal range of motion. Neck supple.  Cardiovascular: Normal rate, regular rhythm and normal heart sounds.   Pulmonary/Chest: Effort normal and breath sounds normal. No accessory muscle usage. No respiratory distress. He has no wheezes. He has no rhonchi. He has no rales.  Normal work of breathing; no wheezes, rhonchi, or rales; speaking in full complete sentences without difficulty  Musculoskeletal: Normal range of motion.  Neurological: He is alert and oriented to person, place, and time.  Skin: Skin is warm and dry. He is not diaphoretic.  Psychiatric: He has a normal mood and affect.  Nursing note and vitals reviewed.   ED Course  Procedures (including critical care time) Labs Review Labs Reviewed - No data to display  Imaging Review No results found.   EKG Interpretation None      MDM   Final diagnoses:  Bilateral conjunctivitis  Asthma, mild intermittent, uncomplicated   28 year old male with complaint of bilateral eye irritation and discharge. He developed shortness of breath while in the waiting room, patient does have history of asthma. He was given nebulizer treatment prior to my evaluation and currently denies any chest pain or shortness of breath. His vital signs are stable on room air. Doubt ACS, PE, dissection, or other acute cardiac event.  Patient given albuterol inhaler in the ED, do not feel further intervention needed at this time regarding his asthma.  Patient's bilateral conjunctiva do appear injected with discharge noted, there is a film present over his right eye. Symptoms consistent with conjunctivitis. Patient be started on antibiotic drops, first dose given in the ED.  Patient will follow-up with his eye doctor if symptoms worsen or no improvement in the next 48 hours.  Discussed plan with patient, he/she acknowledged understanding and agreed with plan of care.  Return precautions given for new or worsening symptoms.  Garlon HatchetLisa M Mikail Goostree,  PA-C 12/22/13 [id number]  Loren Raceravid Yelverton, MD 12/23/13 [phone]

## 2013-12-28 ENCOUNTER — Emergency Department (HOSPITAL_COMMUNITY)
Admission: EM | Admit: 2013-12-28 | Discharge: 2013-12-28 | Disposition: A | Discharge status: Home / Self Care | Payer: Self-pay | Attending: Emergency Medicine | Admitting: Emergency Medicine

## 2013-12-28 ENCOUNTER — Encounter (HOSPITAL_COMMUNITY): Payer: Self-pay | Admitting: Emergency Medicine

## 2013-12-28 DIAGNOSIS — Z79899 Other long term (current) drug therapy: Secondary | ICD-10-CM | POA: Insufficient documentation

## 2013-12-28 DIAGNOSIS — J45901 Unspecified asthma with (acute) exacerbation: Secondary | ICD-10-CM | POA: Insufficient documentation

## 2013-12-28 DIAGNOSIS — Z720 Tobacco use: Secondary | ICD-10-CM | POA: Insufficient documentation

## 2013-12-28 DIAGNOSIS — Z7952 Long term (current) use of systemic steroids: Secondary | ICD-10-CM | POA: Insufficient documentation

## 2013-12-28 MED ORDER — DEXAMETHASONE 6 MG PO TABS
16.0000 mg | ORAL_TABLET | Freq: Once | ORAL | Status: AC
  Administered 2013-12-28: 16 mg via ORAL
  Filled 2013-12-28: qty 1

## 2013-12-28 MED ORDER — ALBUTEROL SULFATE HFA 108 (90 BASE) MCG/ACT IN AERS
2.0000 | INHALATION_SPRAY | Freq: Once | RESPIRATORY_TRACT | Status: AC
  Administered 2013-12-28: 2 via RESPIRATORY_TRACT
  Filled 2013-12-28: qty 6.7

## 2013-12-28 NOTE — Discharge Instructions (Signed)
Asthma, Acute Bronchospasm °Acute bronchospasm caused by asthma is also referred to as an asthma attack. Bronchospasm means your air passages become narrowed. The narrowing is caused by inflammation and tightening of the muscles in the air tubes (bronchi) in your lungs. This can make it hard to breathe or cause you to wheeze and cough. °CAUSES °Possible triggers are: °· Animal dander from the skin, hair, or feathers of animals. °· Dust mites contained in house dust. °· Cockroaches. °· Pollen from trees or grass. °· Mold. °· Cigarette or tobacco smoke. °· Air pollutants such as dust, household cleaners, hair sprays, aerosol sprays, paint fumes, strong chemicals, or strong odors. °· Cold air or weather changes. Cold air may trigger inflammation. Winds increase molds and pollens in the air. °· Strong emotions such as crying or laughing hard. °· Stress. °· Certain medicines such as aspirin or beta-blockers. °· Sulfites in foods and drinks, such as dried fruits and wine. °· Infections or inflammatory conditions, such as a flu, cold, or inflammation of the nasal membranes (rhinitis). °· Gastroesophageal reflux disease (GERD). GERD is a condition where stomach acid backs up into your esophagus. °· Exercise or strenuous activity. °SIGNS AND SYMPTOMS  °· Wheezing. °· Excessive coughing, particularly at night. °· Chest tightness. °· Shortness of breath. °DIAGNOSIS  °Your health care provider will ask you about your medical history and perform a physical exam. A chest X-ray or blood testing may be performed to look for other causes of your symptoms or other conditions that may have triggered your asthma attack.  °TREATMENT  °Treatment is aimed at reducing inflammation and opening up the airways in your lungs.  Most asthma attacks are treated with inhaled medicines. These include quick relief or rescue medicines (such as bronchodilators) and controller medicines (such as inhaled corticosteroids). These medicines are sometimes  given through an inhaler or a nebulizer. Systemic steroid medicine taken by mouth or given through an IV tube also can be used to reduce the inflammation when an attack is moderate or severe. Antibiotic medicines are only used if a bacterial infection is present.  °HOME CARE INSTRUCTIONS  °· Rest. °· Drink plenty of liquids. This helps the mucus to remain thin and be easily coughed up. Only use caffeine in moderation and do not use alcohol until you have recovered from your illness. °· Do not smoke. Avoid being exposed to secondhand smoke. °· You play a critical role in keeping yourself in good health. Avoid exposure to things that cause you to wheeze or to have breathing problems. °· Keep your medicines up-to-date and available. Carefully follow your health care provider's treatment plan. °· Take your medicine exactly as prescribed. °· When pollen or pollution is bad, keep windows closed and use an air conditioner or go to places with air conditioning. °· Asthma requires careful medical care. See your health care provider for a follow-up as advised. If you are more than [redacted] weeks pregnant and you were prescribed any new medicines, let your obstetrician know about the visit and how you are doing. Follow up with your health care provider as directed. °· After you have recovered from your asthma attack, make an appointment with your outpatient doctor to talk about ways to reduce the likelihood of future attacks. If you do not have a doctor who manages your asthma, make an appointment with a primary care doctor to discuss your asthma. °SEEK IMMEDIATE MEDICAL CARE IF:  °· You are getting worse. °· You have trouble breathing. If severe, call your local   emergency services (911 in the [place] °· You develop chest pain or discomfort. °· You are vomiting. °· You are not able to keep fluids down. °· You are coughing up yellow, green, brown, or bloody sputum. °· You have a fever and your symptoms suddenly get worse. °· You have  trouble swallowing. °MAKE SURE YOU:  °· Understand these instructions. °· Will watch your condition. °· Will get help right away if you are not doing well or get worse. °Document Released: 05/21/2006 Document Revised: 02/08/2013 Document Reviewed: 08/11/2012 °ExitCare® Patient Information ©2015 [employer]. This information is not intended to replace advice given to you by your health care provider. Make sure you discuss any questions you have with your health care provider. ° ° °Emergency Department Resource Guide °1) Find a Doctor and Pay Out of Pocket °Although you won't have to find out who is covered by your insurance plan, it is a good idea to ask around and get recommendations. You will then need to call the office and see if the doctor you have chosen will accept you as a new patient and what types of options they offer for patients who are self-pay. Some doctors offer discounts or will set up payment plans for their patients who do not have insurance, but you will need to ask so you aren't surprised when you get to your appointment. ° °2) Contact Your Local Health Department °Not all health departments have doctors that can see patients for sick visits, but many do, so it is worth a call to see if yours does. If you don't know where your local health department is, you can check in your phone book. The CDC also has a tool to help you locate your state's health department, and many state websites also have listings of all of their local health departments. ° °3) Find a Walk-in Clinic °If your illness is not likely to be very severe or complicated, you may want to try a walk in clinic. These are popping up all over the country in pharmacies, drugstores, and shopping centers. They're usually staffed by nurse practitioners or physician assistants that have been trained to treat common illnesses and complaints. They're usually fairly quick and inexpensive. However, if you have serious medical issues or chronic  medical problems, these are probably not your best option. ° °No Primary Care Doctor: °- Call [facility] at  832-8000 - they can help you locate a primary care doctor that  accepts your insurance, provides certain services, etc. °- Physician Referral Service- 1-800-533-3463 ° °Chronic Pain Problems: °Organization         Address  Phone   Notes  °[facility]  (336) 297-2271 Patients need to be referred by their primary care doctor.  ° °Medication Assistance: °Organization         Address  Phone   Notes  °[place] [facility] [address] °[place], [place] [address] (336) 641-8030 --Must be a resident of [place] °-- Must have NO insurance coverage whatsoever (no Medicaid/ Medicare, etc.) °-- The pt. MUST have a primary care doctor that directs their care regularly and follows them in the community °  °[facility]  (866) 331-1348   °[employer]  (888) 892-1162   ° °Agencies that provide inexpensive medical care: °Organization         Address  Phone   Notes  °[facility]  (336) 832-8035   °[facility]    (  336) 832-7272   °[facility] [address] °[place], [place] [address] (336) 832-4777   °[facility] [address], °[place] (336) 271-4999   °[facility]    (336) 373-0678   °[facility]    (336) 272-1050   °[facility] ° [address], [place] Phone:  (336) 832-4444, Fax:  (336) 832-4440 Hours of Operation:  9 am - 6 pm, M-F.  Also accepts Medicaid/Medicare and self-pay.  °[facility] ° [address], [place] Phone: (336) 832-3150, Fax: (336) 832-3151. Hours of Operation:  8:30 am - 5:30 pm, M-F.  Also accepts Medicaid and self-pay.  °[facility] [place] [address], [place] Phone: (336) 878-6027   °[facility] [address], [place], [place] (336)723-1848,  Ext. 123 Mondays & Thursdays: 7-9 AM.  First 15 patients are seen on a first come, first serve basis. °  ° °Medicaid-accepting [place] Providers: ° °Organization         Address  Phone   Notes  °[facility] [address], [place] (336) 641-2100 Also accepts self-pay patients.  °[facility] [address], [place] ° (336) 856-9996   °[facility] [address], [place] (336) 288-8857   °[facility] [address] [place] [address], [place] (336) 299-7000   °Veita Bland [address], [place]  ° (336) 373-1557 Only accepts  Access Medicaid patients after they have their name applied to their card.  ° °Self-Pay (no insurance) in [place]: ° °Organization         Address  Phone   Notes  °Sickle Cell Patients, [facility] [address], [place] (336) 832-1970   °[facility] [address], [place] (336) 832-4400   °[facility] ° [address], [place] (336) 992-4800   °[facility]/Dr. Osei-Bonsu ° [address] [place] [address], [place] or [address], [place] (336) 841-8500 Phone number for both [place] and [place] locations is the same.  °[facility] [address], [place] (336) 299-0000   °[facility] [address] [place] [address], [place] or [address] (336) 852-7530 °(336) 878-2260   °[facility] [address], [place] (336) 350-1642, phone; (336) 294-5005, fax Sees patients 1st and 3rd Saturday of every month.  Must not qualify for public or private insurance (i.e. Medicaid, Medicare, [place] Health Choice, Veterans' Benefits) • Household income should be no more than 200% of the poverty level •The clinic cannot treat you if you are pregnant or think you are pregnant • Sexually transmitted diseases are not  treated at the clinic.  ° ° °Dental Care: °Organization         Address  Phone  Notes  °[facility] [address], [place] (336) 641-6152 Accepts children up to age 21 who are enrolled in Medicaid or [place] Health Choice; pregnant women with a Medicaid card; and children who have applied for Medicaid or [place] Health Choice, but were declined, whose parents can pay a reduced fee at time of service.  °[facility] [place]  [address], [place] (336) 641-7733 Accepts children up   to age 21 who are enrolled in Medicaid or [place] Health Choice; pregnant women with a Medicaid card; and children who have applied for Medicaid or [place] Health Choice, but were declined, whose parents can pay a reduced fee at time of service.  °[facility] ° [address], [place] (336) 641-4533 Patients are seen by appointment only. Walk-ins are not accepted. [facility] will see patients 18 years of age and older. °Monday - Tuesday (8am-5pm) °Most Wednesdays (8:30-5pm) °$30 per visit, cash only  °[facility] ° [address], [place] (336) 641-4533 Patients are seen by appointment only. Walk-ins are not accepted. [facility] will see patients 18 years of age and older. °One Wednesday Evening (Monthly: Volunteer Based).  $30 per visit, cash only  °[facility]  (919) 537-3737 for adults; Children under age 4, call Graduate Pediatric Dentistry at (919) [phone]. Children aged 4-14, please call (919) [phone] to request a pediatric application. ° Dental services are provided in all areas of dental care including fillings, crowns and bridges, complete and partial dentures, implants, gum treatment, root canals, and extractions. Preventive care is also provided. Treatment is provided to both adults and children. °Patients are selected via a lottery and there is  often a waiting list. °  °[facility] [address], °[place] ° (336) 763-8833 [email or web address] °  °[facility] [address], [place], [place] (336)723-1848, Ext. 123 Second and Fourth Thursday of each month, opens at 6:30 AM; Clinic ends at 9 AM.  Patients are seen on a first-come first-served basis, and a limited number are seen during each clinic.  ° °[facility] ° [address], [place], [place] (336) 723-7904   Eligibility Requirements °You must have lived in [place], [place], or [place] counties for at least the last three months. °  You cannot be eligible for state or federal sponsored healthcare insurance, including Veterans Administration, Medicaid, or Medicare. °  You generally cannot be eligible for healthcare insurance through your employer.  °  How to apply: °Eligibility screenings are held every Tuesday and Wednesday afternoon from 1:00 pm until 4:00 pm. You do not need an appointment for the interview!  °[facility] [address], [place], [place] 336-631-2330   °[facility]  336-342-8273   °[facility]  336-703-3100   °[facility]  336-570-6415   ° °Behavioral Health Resources in the Community: °Intensive Outpatient Programs °Organization         Address  Phone  Notes  °[facility] [address], [place], [place] 336-878-6098   °[facility] [address], [place], [place] 336-832-9800   °[facility] [address], [place], [place] ° 336-882-2125   °[facility] [address],  °[place], [place] 1-800-853-5163 or 336-641-4981   °Substance Abuse Resources °Organization         Address  Phone  Notes  °[facility]  336-882-2125   °[facility]  336-784-9470   °[facility]  336-285-9073   °[facility]  336-845-3988   °Residential & Outpatient Substance  Abuse Program  1-800-659-3381   °Psychological Services °Organization         Address  Phone  Notes  °[facility]  336- 832-9600   °[facility]  336- 378-7881   °[facility]   [address], [place] 1-800-853-5163 or 336-641-4981   ° °Mobile Crisis Teams °Organization         Address  Phone  Notes  °[facility], Mobile Crisis Care Unit  1-877-626-1772   °Assertive °[facility] ° [address] [place], [place] 336-834-9664   °Sharon DeEsch [address] °[place] 336-554-5454   ° °Self-Help/Support Groups °Organization         Address  Phone             Notes  °[facility] - variety of support groups  336- 373-1402 Call for more information  °Narcotics Anonymous (NA), [facility] [address], °[place]  2 meetings at this location  ° °Residential Treatment Programs °Organization         Address  Phone  Notes  °[facility] [address],    °[place]  1-866-801-8205   °[facility] ° [address], [place], [place] 704-293-8524   °[facility] [address], [place] 336-845-3988 Admissions: 8am-3pm M-F  °[facility] [address]    °[place], [place] 336-841-1104   °[facility] [address], [place], [place] 336-379-7146   °[facility] [address]  °[place], [place] 336-285-9073   °[facility] Intensive Outpatient [address], [place], [place] 336-852-3033   °[facility] [address]  °[place], [place] 1-877-615-2722 or 336-784-9470   °[facility] [address] [place], [place] 336-227-7417 Accepts Medicaid  °[facility] [address]  °[place] 1-800-659-3381 Substance Abuse/Addiction Treatment  ° °[place] Behavioral Health Resources °Organization          Address  Phone  Notes  °[facility]  (888) 581-9988   °Julie Brannon, PhD [address] [place], [place]   (336) 349-5553 or (336) 951-0000   °[facility]   [address] °[place], [place] (336) 349-4454   °[facility] [address], [place], [place] (336) 342-8316 Insurance/Medicaid/sponsorship through [facility]  °[facility] [address]                                    [place], [place] (336) 342-8316 Therapy/tele-psych/case  °[facility] [address]  ° [place], [place] (336) 349-2233    °Dr. Arfeen  (336) 349-4544   °[facility] [place]  [employer] [place] [facility] 1) [address], [place] °2) [address], [place] °3)  [address], [place] (336) 349-3220 °(336) 342-7768 ° °(336) 342-8140   °[place] [facility] (336) 342-1394 or (336) 342-3537 (After Hours)    ° ° ° °

## 2013-12-28 NOTE — ED Notes (Signed)
Pt reports asthma attack earlier today, given breathing tx via EMS and feels better. Pt states all he wants is a new inhaler because his old one has run out.

## 2013-12-28 NOTE — ED Notes (Signed)
Per EMS: Pt from home c/o SOB x 1 hr.  Hx of asthma and can't afford an inhaler.  Pt was given 5 mg of albuterol and feels better "not 100% but much better".

## 2014-01-02 ENCOUNTER — Emergency Department (HOSPITAL_COMMUNITY)
Admission: EM | Admit: 2014-01-02 | Discharge: 2014-01-02 | Disposition: A | Discharge status: Home / Self Care | Payer: Self-pay | Attending: Emergency Medicine | Admitting: Emergency Medicine

## 2014-01-02 ENCOUNTER — Encounter (HOSPITAL_COMMUNITY): Payer: Self-pay | Admitting: Oncology

## 2014-01-02 ENCOUNTER — Emergency Department (HOSPITAL_COMMUNITY): Payer: Self-pay

## 2014-01-02 DIAGNOSIS — H1013 Acute atopic conjunctivitis, bilateral: Secondary | ICD-10-CM | POA: Insufficient documentation

## 2014-01-02 DIAGNOSIS — Z79899 Other long term (current) drug therapy: Secondary | ICD-10-CM | POA: Insufficient documentation

## 2014-01-02 DIAGNOSIS — R062 Wheezing: Secondary | ICD-10-CM

## 2014-01-02 DIAGNOSIS — J45901 Unspecified asthma with (acute) exacerbation: Secondary | ICD-10-CM | POA: Insufficient documentation

## 2014-01-02 DIAGNOSIS — Z7952 Long term (current) use of systemic steroids: Secondary | ICD-10-CM | POA: Insufficient documentation

## 2014-01-02 DIAGNOSIS — Z720 Tobacco use: Secondary | ICD-10-CM | POA: Insufficient documentation

## 2014-01-02 LAB — COMPREHENSIVE METABOLIC PANEL
ALK PHOS: 66 U/L (ref 39–117)
ALT: 23 U/L (ref 0–53)
ANION GAP: 10 (ref 5–15)
AST: 24 U/L (ref 0–37)
Albumin: 3 g/dL — ABNORMAL LOW (ref 3.5–5.2)
BUN: 9 mg/dL (ref 6–23)
CHLORIDE: 103 meq/L (ref 96–112)
CO2: 26 mEq/L (ref 19–32)
Calcium: 8.4 mg/dL (ref 8.4–10.5)
Creatinine, Ser: 1.27 mg/dL (ref 0.50–1.35)
GFR calc non Af Amer: 77 mL/min — ABNORMAL LOW (ref >90)
GFR, EST AFRICAN AMERICAN: 89 mL/min — AB (ref >90)
GLUCOSE: 136 mg/dL — AB (ref 70–99)
POTASSIUM: 3.7 meq/L (ref 3.7–5.3)
Sodium: 139 mEq/L (ref 137–147)
Total Protein: 5.6 g/dL — ABNORMAL LOW (ref 6.0–8.3)

## 2014-01-02 LAB — CBC
HEMATOCRIT: 45.5 % (ref 39.0–52.0)
HEMOGLOBIN: 15.8 g/dL (ref 13.0–17.0)
MCH: 30.7 pg (ref 26.0–34.0)
MCHC: 34.7 g/dL (ref 30.0–36.0)
MCV: 88.5 fL (ref 78.0–100.0)
Platelets: 219 10*3/uL (ref 150–400)
RBC: 5.14 MIL/uL (ref 4.22–5.81)
RDW: 12.5 % (ref 11.5–15.5)
WBC: 6.4 10*3/uL (ref 4.0–10.5)

## 2014-01-02 LAB — I-STAT TROPONIN, ED: Troponin i, poc: 0.02 ng/mL (ref 0.00–0.08)

## 2014-01-02 MED ORDER — ALBUTEROL SULFATE HFA 108 (90 BASE) MCG/ACT IN AERS
2.0000 | INHALATION_SPRAY | RESPIRATORY_TRACT | Status: DC | PRN
  Administered 2014-01-02: 2 via RESPIRATORY_TRACT
  Filled 2014-01-02: qty 6.7

## 2014-01-02 MED ORDER — PREDNISONE 20 MG PO TABS: 60.0000 mg | ORAL_TABLET | Freq: Every day | ORAL | Status: DC

## 2014-01-02 MED ORDER — PREDNISONE 20 MG PO TABS
60.0000 mg | ORAL_TABLET | Freq: Once | ORAL | Status: AC
  Administered 2014-01-02: 60 mg via ORAL
  Filled 2014-01-02: qty 3

## 2014-01-02 MED ORDER — OLOPATADINE HCL 0.1 % OP SOLN
1.0000 [drp] | Freq: Two times a day (BID) | OPHTHALMIC | Status: DC
  Administered 2014-01-02: 1 [drp] via OPHTHALMIC
  Filled 2014-01-02: qty 5

## 2014-01-02 NOTE — ED Provider Notes (Signed)
CSN: [id number]     Arrival date & time 01/02/14  0340 History   First MD Initiated Contact with Patient 01/02/14 0347     Chief Complaint  Patient presents with  . Shortness of Breath     (Consider location/radiation/quality/duration/timing/severity/associated sxs/prior Treatment) Patient is a 28 y.o. male presenting with shortness of breath. The history is provided by the patient. No language interpreter was used.  Shortness of Breath Severity:  Moderate Associated symptoms: wheezing   Associated symptoms: no abdominal pain and no fever   Associated symptoms comment:  He returns to the ED via EMS for wheezing. He ran out of his inhaler yesterday and reports he has been needing it on a daily basis for the past 2-3 weeks. He also complains of persistent eye irritation and discomfort after being diagnosed with conjunctivitis and using the abx drops given to him in the ED. No visual impairment. No fever.    Past Medical History  Diagnosis Date  . Asthma    History reviewed. No pertinent past surgical history. History reviewed. No pertinent family history. History  Substance Use Topics  . Smoking status: Current Every Day Smoker -- 0.02 packs/day    Types: Cigarettes  . Smokeless tobacco: Never Used  . Alcohol Use: Yes     Comment: occasional    Review of Systems  Constitutional: Negative for fever and chills.  HENT: Negative.   Eyes: Positive for pain and redness. Negative for visual disturbance.  Respiratory: Positive for shortness of breath and wheezing.   Cardiovascular: Negative.   Gastrointestinal: Negative.  Negative for abdominal pain.  Genitourinary: Negative.   Musculoskeletal: Negative.   Skin: Negative.   Neurological: Negative.       Allergies  No known allergies  Home Medications   Prior to Admission medications   Medication Sig Start Date End Date Taking? Authorizing Provider  ibuprofen (ADVIL,MOTRIN) 200 MG tablet Take 400 mg by mouth every 6 (six)  hours as needed for mild pain.   Yes Historical Provider, MD  acetaminophen (TYLENOL) 325 MG tablet Take 650 mg by mouth every 6 (six) hours as needed for mild pain.    Historical Provider, MD  albuterol (PROVENTIL HFA;VENTOLIN HFA) 108 (90 BASE) MCG/ACT inhaler Inhale 2 puffs into the lungs every 4 (four) hours as needed for wheezing or shortness of breath. 07/06/13   Lyanne CoKevin M Campos, MD  albuterol (PROVENTIL HFA;VENTOLIN HFA) 108 (90 BASE) MCG/ACT inhaler Inhale 2 puffs into the lungs every 4 (four) hours as needed for wheezing or shortness of breath. 11/22/13   Shon Batonourtney F Horton, MD  HYDROcodone-acetaminophen (NORCO/VICODIN) 5-325 MG per tablet Take 1-2 tablets by mouth every 4 (four) hours as needed for moderate pain or severe pain. 11/14/13   Harle BattiestElizabeth Tysinger, NP  predniSONE (DELTASONE) 20 MG tablet Take 3 tablets (60 mg total) by mouth daily. 11/22/13   Shon Batonourtney F Horton, MD   BP 133/77 mmHg  Pulse 62  Resp 18  SpO2 100% Physical Exam  Constitutional: He is oriented to person, place, and time. He appears well-developed and well-nourished. No distress.  HENT:  Head: Normocephalic.  Eyes:  Conjunctiva mildly injected bilaterally. Minimal eye lid swelling. No redness or periorbital edema.   Neck: Normal range of motion. Neck supple.  Cardiovascular: Normal rate.   Pulmonary/Chest: Effort normal and breath sounds normal. He has no wheezes. He has no rales.  *Examined after nebulizer treatment via EMS.   No wheezing, normal effort.   Neurological: He is alert and oriented  to person, place, and time.  Skin: Skin is warm and dry.  Psychiatric: He has a normal mood and affect.    ED Course  Procedures (including critical care time) Labs Review Labs Reviewed  CBC  COMPREHENSIVE METABOLIC PANEL  I-STAT TROPOININ, ED    Imaging Review Dg Chest 2 View  01/02/2014   CLINICAL DATA:  Shortness of breath, chest pain, cough, congestion for 1 month. Worsening over night. 04/27/2013  EXAM:  CHEST  2 VIEW  COMPARISON:  None.  FINDINGS: Pulmonary hyperinflation. The heart size and mediastinal contours are within normal limits. Both lungs are clear. The visualized skeletal structures are unremarkable.  IMPRESSION: No active cardiopulmonary disease.   Electronically Signed   By: Burman NievesWilliam  Stevens M.D.   On: 01/02/2014 04:22     EKG Interpretation None      MDM   Final diagnoses:  None    1. Asthma 2. Conjunctivitis  Consider allergic cause of conjunctivitis given increased wheezing and failure of abx drops. Will Rx antihistamine drops, restart on steroids and provide inhaler. He reports he is seeking PCP from the resource list given to him on previous ED visit but has not established with a doctor yet.     Arnoldo HookerShari A Victoriya Pol, PA-C 01/02/14 [id number]  Olivia Mackielga M Otter, MD 01/02/14 [phone]

## 2014-01-02 NOTE — ED Notes (Signed)
Per EMS pt presents d/t SOB that started at 0100.  Pt reported that he was out of albuterol.  10 mg albuterol, 0.5 mg Atrovent and 125 mg of solumedrol given en route.  Denies pain.  Pt reported having an allergic reaction at some point and since then he has been having increase in SOB.

## 2014-01-02 NOTE — Discharge Instructions (Signed)
Allergic Conjunctivitis  The conjunctiva is a thin membrane that covers the visible white part of the eyeball and the underside of the eyelids. This membrane protects and lubricates the eye. The membrane has small blood vessels running through it that can normally be seen. When the conjunctiva becomes inflamed, the condition is called conjunctivitis. In response to the inflammation, the conjunctival blood vessels become swollen. The swelling results in redness in the normally white part of the eye.  The blood vessels of this membrane also react when a person has allergies and is then called allergic conjunctivitis. This condition usually lasts for as long as the allergy persists. Allergic conjunctivitis cannot be passed to another person (non-contagious). The likelihood of bacterial infection is great and the cause is not likely due to allergies if the inflamed eye has:  · A sticky discharge.  · Discharge or sticking together of the lids in the morning.  · Scaling or flaking of the eyelids where the eyelashes come out.  · Red swollen eyelids.  CAUSES   · Viruses.  · Irritants such as foreign bodies.  · Chemicals.  · General allergic reactions.  · Inflammation or serious diseases in the inside or the outside of the eye or the orbit (the boney cavity in which the eye sits) can cause a "red eye."  SYMPTOMS   · Eye redness.  · Tearing.  · Itchy eyes.  · Burning feeling in the eyes.  · Clear drainage from the eye.  · Allergic reaction due to pollens or ragweed sensitivity. Seasonal allergic conjunctivitis is frequent in the spring when pollens are in the air and in the fall.  DIAGNOSIS   This condition, in its many forms, is usually diagnosed based on the history and an ophthalmological exam. It usually involves both eyes. If your eyes react at the same time every year, allergies may be the cause. While most "red eyes" are due to allergy or an infection, the role of an eye (ophthalmological) exam is important. The exam  can rule out serious diseases of the eye or orbit.  TREATMENT   · Non-antibiotic eye drops, ointments, or medications by mouth may be prescribed if the ophthalmologist is sure the conjunctivitis is due to allergies alone.  · Over-the-counter drops and ointments for allergic symptoms should be used only after other causes of conjunctivitis have been ruled out, or as your caregiver suggests.  Medications by mouth are often prescribed if other allergy-related symptoms are present. If the ophthalmologist is sure that the conjunctivitis is due to allergies alone, treatment is normally limited to drops or ointments to reduce itching and burning.  HOME CARE INSTRUCTIONS   · Wash hands before and after applying drops or ointments, or touching the inflamed eye(s) or eyelids.  · Do not let the eye dropper tip or ointment tube touch the eyelid when putting medicine in your eye.  · Stop using your soft contact lenses and throw them away. Use a new pair of lenses when recovery is complete. You should run through sterilizing cycles at least three times before use after complete recovery if the old soft contact lenses are to be used. Hard contact lenses should be stopped. They need to be thoroughly sterilized before use after recovery.  · Itching and burning eyes due to allergies is often relieved by using a cool cloth applied to closed eye(s).  SEEK MEDICAL CARE IF:   · Your problems do not go away after two or three days of treatment.  ·   Your lids are sticky (especially in the morning when you wake up) or stick together.  · Discharge develops. Antibiotics may be needed either as drops, ointment, or by mouth.  · You have extreme light sensitivity.  · An oral temperature above 102° F (38.9° C) develops.  · Pain in or around the eye or any other visual symptom develops.  MAKE SURE YOU:   · Understand these instructions.  · Will watch your condition.  · Will get help right away if you are not doing well or get worse.  Document  Released: 04/26/2002 Document Revised: 04/28/2011 Document Reviewed: 03/22/2007  [employer]® Patient Information ©2015 [employer]. This information is not intended to replace advice given to you by your health care provider. Make sure you discuss any questions you have with your health care provider.

## 2014-01-05 NOTE — ED Provider Notes (Signed)
CSN: [id number]     Arrival date & time 12/28/13  1506 History   First MD Initiated Contact with Patient 12/28/13 1528     Chief Complaint  Patient presents with  . Asthma     (Consider location/radiation/quality/duration/timing/severity/associated sxs/prior Treatment) HPI   [age]y male with shortness of breath. He reports a past history of asthma and feels like he is having exacerbation. He is currently out of his albuterol inhaler. He reports that his no financial means to obtain another one. No fevers or chills. No chest pain. No unusual leg pain or swelling. No nausea or vomiting. No dizziness or lightheadedness.  Past Medical History  Diagnosis Date  . Asthma    No past surgical history on file. No family history on file. History  Substance Use Topics  . Smoking status: Current Every Day Smoker -- 0.02 packs/day    Types: Cigarettes  . Smokeless tobacco: Never Used  . Alcohol Use: Yes     Comment: occasional    Review of Systems  All systems reviewed and negative, other than as noted in HPI.   Allergies  No known allergies  Home Medications   Prior to Admission medications   Medication Sig Start Date End Date Taking? Authorizing Provider  acetaminophen (TYLENOL) 325 MG tablet Take 650 mg by mouth every 6 (six) hours as needed for mild pain.    Historical Provider, MD  albuterol (PROVENTIL HFA;VENTOLIN HFA) 108 (90 BASE) MCG/ACT inhaler Inhale 2 puffs into the lungs every 4 (four) hours as needed for wheezing or shortness of breath. 07/06/13   Lyanne CoKevin M Campos, MD  albuterol (PROVENTIL HFA;VENTOLIN HFA) 108 (90 BASE) MCG/ACT inhaler Inhale 2 puffs into the lungs every 4 (four) hours as needed for wheezing or shortness of breath. 11/22/13   Shon Batonourtney F Horton, MD  HYDROcodone-acetaminophen (NORCO/VICODIN) 5-325 MG per tablet Take 1-2 tablets by mouth every 4 (four) hours as needed for moderate pain or severe pain. 11/14/13   Harle BattiestElizabeth Tysinger, NP  ibuprofen (ADVIL,MOTRIN) 200  MG tablet Take 400 mg by mouth every 6 (six) hours as needed for mild pain.    Historical Provider, MD  predniSONE (DELTASONE) 20 MG tablet Take 3 tablets (60 mg total) by mouth daily. 01/02/14   Shari A Upstill, PA-C   BP 128/75 mmHg  Temp(Src) 98.3 F (36.8 C) (Oral)  Resp 15  Ht 6\' 2"  (1.88 m)  Wt 190 lb (86.183 kg)  BMI 24.38 kg/m2  SpO2 100% Physical Exam  Constitutional: He appears well-developed and well-nourished. No distress.  Sitting in chair. No acute distress. Very pleasant. Speaking in complete sentences.  HENT:  Head: Normocephalic and atraumatic.  Eyes: Conjunctivae are normal. Right eye exhibits no discharge. Left eye exhibits no discharge.  Neck: Neck supple.  Cardiovascular: Normal rate, regular rhythm and normal heart sounds.  Exam reveals no gallop and no friction rub.   No murmur heard. Pulmonary/Chest: Effort normal and breath sounds normal. No respiratory distress.  No adventitious breath sounds appreciated  Abdominal: Soft. He exhibits no distension. There is no tenderness.  Musculoskeletal: He exhibits no edema or tenderness.  Lower extremities symmetric as compared to each other. No calf tenderness. Negative Homan's. No palpable cords.   Neurological: He is alert.  Skin: Skin is warm and dry.  Psychiatric: He has a normal mood and affect. His behavior is normal. Thought content normal.  Nursing note and vitals reviewed.   ED Course  Procedures (including critical care time) Labs Review Labs Reviewed -  No data to display  Imaging Review No results found.   EKG Interpretation None      MDM   Final diagnoses:  Asthma exacerbation    28 year old male with shortness of breath. Currently feels better. Obesity on my exam. He was discharged with an inhaler has reports he does not have the financial means to obtain [place] provided with a prescription. He was given a single dose of Decadron essentially for the same reason. He is in no acute distress.  He is afebrile. He does not appear toxic. Doubt pneumonia. Doubt pulmonary embolism. Doubt ACS other potential emergent etiology of his presenting complaints. I feel is stable for discharge.    Raeford RazorStephen Yeila Morro, MD 01/05/14 1016

## 2014-01-09 ENCOUNTER — Emergency Department (HOSPITAL_COMMUNITY)
Admission: EM | Admit: 2014-01-09 | Discharge: 2014-01-09 | Disposition: A | Discharge status: Home / Self Care | Payer: Self-pay | Attending: Emergency Medicine | Admitting: Emergency Medicine

## 2014-01-09 ENCOUNTER — Encounter (HOSPITAL_COMMUNITY): Payer: Self-pay | Admitting: Emergency Medicine

## 2014-01-09 DIAGNOSIS — Z79899 Other long term (current) drug therapy: Secondary | ICD-10-CM | POA: Insufficient documentation

## 2014-01-09 DIAGNOSIS — J45901 Unspecified asthma with (acute) exacerbation: Secondary | ICD-10-CM | POA: Insufficient documentation

## 2014-01-09 DIAGNOSIS — Z7952 Long term (current) use of systemic steroids: Secondary | ICD-10-CM | POA: Insufficient documentation

## 2014-01-09 DIAGNOSIS — Z720 Tobacco use: Secondary | ICD-10-CM | POA: Insufficient documentation

## 2014-01-09 LAB — CBC WITH DIFFERENTIAL/PLATELET
BASOS ABS: 0 10*3/uL (ref 0.0–0.1)
Basophils Relative: 0 % (ref 0–1)
EOS PCT: 5 % (ref 0–5)
Eosinophils Absolute: 0.3 10*3/uL (ref 0.0–0.7)
HCT: 46.5 % (ref 39.0–52.0)
Hemoglobin: 15.7 g/dL (ref 13.0–17.0)
LYMPHS ABS: 2.2 10*3/uL (ref 0.7–4.0)
Lymphocytes Relative: 36 % (ref 12–46)
MCH: 30.6 pg (ref 26.0–34.0)
MCHC: 33.8 g/dL (ref 30.0–36.0)
MCV: 90.6 fL (ref 78.0–100.0)
Monocytes Absolute: 0.5 10*3/uL (ref 0.1–1.0)
Monocytes Relative: 8 % (ref 3–12)
Neutro Abs: 3.2 10*3/uL (ref 1.7–7.7)
Neutrophils Relative %: 51 % (ref 43–77)
PLATELETS: 218 10*3/uL (ref 150–400)
RBC: 5.13 MIL/uL (ref 4.22–5.81)
RDW: 12.9 % (ref 11.5–15.5)
WBC: 6.2 10*3/uL (ref 4.0–10.5)

## 2014-01-09 LAB — BASIC METABOLIC PANEL
Anion gap: 10 (ref 5–15)
BUN: 12 mg/dL (ref 6–23)
CO2: 29 mEq/L (ref 19–32)
Calcium: 8.3 mg/dL — ABNORMAL LOW (ref 8.4–10.5)
Chloride: 105 mEq/L (ref 96–112)
Creatinine, Ser: 1.24 mg/dL (ref 0.50–1.35)
GFR calc Af Amer: >90 mL/min (ref >90)
GFR, EST NON AFRICAN AMERICAN: 79 mL/min — AB (ref >90)
GLUCOSE: 118 mg/dL — AB (ref 70–99)
Potassium: 4.1 mEq/L (ref 3.7–5.3)
SODIUM: 144 meq/L (ref 137–147)

## 2014-01-09 MED ORDER — PREDNISONE 20 MG PO TABS
60.0000 mg | ORAL_TABLET | Freq: Once | ORAL | Status: AC
  Administered 2014-01-09: 60 mg via ORAL
  Filled 2014-01-09: qty 3

## 2014-01-09 MED ORDER — MAGNESIUM SULFATE 2 GM/50ML IV SOLN
2.0000 g | Freq: Once | INTRAVENOUS | Status: AC
  Administered 2014-01-09: 2 g via INTRAVENOUS
  Filled 2014-01-09: qty 50

## 2014-01-09 MED ORDER — IPRATROPIUM BROMIDE 0.02 % IN SOLN
0.5000 mg | Freq: Once | RESPIRATORY_TRACT | Status: AC
  Administered 2014-01-09: 0.5 mg via RESPIRATORY_TRACT
  Filled 2014-01-09: qty 2.5

## 2014-01-09 MED ORDER — PREDNISONE 20 MG PO TABS: 40.0000 mg | ORAL_TABLET | Freq: Every day | ORAL | Status: DC

## 2014-01-09 MED ORDER — ALBUTEROL SULFATE HFA 108 (90 BASE) MCG/ACT IN AERS
2.0000 | INHALATION_SPRAY | Freq: Once | RESPIRATORY_TRACT | Status: AC
  Administered 2014-01-09: 2 via RESPIRATORY_TRACT
  Filled 2014-01-09: qty 6.7

## 2014-01-09 MED ORDER — ALBUTEROL SULFATE (2.5 MG/3ML) 0.083% IN NEBU
5.0000 mg | INHALATION_SOLUTION | Freq: Once | RESPIRATORY_TRACT | Status: AC
  Administered 2014-01-09: 5 mg via RESPIRATORY_TRACT

## 2014-01-09 MED ORDER — IPRATROPIUM BROMIDE 0.02 % IN SOLN
0.5000 mg | Freq: Once | RESPIRATORY_TRACT | Status: AC
  Administered 2014-01-09: 0.5 mg via RESPIRATORY_TRACT

## 2014-01-09 MED ORDER — SODIUM CHLORIDE 0.9 % IV BOLUS (SEPSIS)
1000.0000 mL | Freq: Once | INTRAVENOUS | Status: AC
  Administered 2014-01-09: 1000 mL via INTRAVENOUS

## 2014-01-09 NOTE — Discharge Instructions (Signed)
[name], you were seen today for asthma. Your symptoms improved with our treatments. Use inhaler as needed. You also need to take prednisone for the next 4 days. Follow-up with a primary care physician within 3 days for continued management. If any of your symptoms worsen come back to the emergency department immediately for repeat evaluation. Thank you. Asthma is a condition of the lungs in which the airways tighten and narrow. Asthma can make it hard to breathe. Asthma cannot be cured, but medicine and lifestyle changes can help control it. Asthma may be started (triggered) by:  Animal skin flakes (dander).  Dust.  Cockroaches.  Pollen.  Mold.  Smoke.  Cleaning products.  Hair sprays or aerosol sprays.  Paint fumes or strong smells.  Cold air, weather changes, and winds.  Crying or laughing hard.  Stress.  Certain medicines or drugs.  Foods, such as dried fruit, potato chips, and sparkling grape juice.  Infections or conditions (colds, flu).  Exercise.  Certain medical conditions or diseases.  Exercise or tiring activities. HOME CARE   Take medicine as told by your doctor.  Use a peak flow meter as told by your doctor. A peak flow meter is a tool that measures how well the lungs are working.  Record and keep track of the peak flow meter's readings.  Understand and use the asthma action plan. An asthma action plan is a written plan for taking care of your asthma and treating your attacks.  To help prevent asthma attacks:  Do not smoke. Stay away from secondhand smoke.  Change your heating and air conditioning filter often.  Limit your use of fireplaces and wood stoves.  Get rid of pests (such as roaches and mice) and their droppings.  Throw away plants if you see mold on them.  Clean your floors. Dust regularly. Use cleaning products that do not smell.  Have someone vacuum when you are not home. Use a vacuum cleaner with a HEPA filter if  possible.  Replace carpet with wood, tile, or vinyl flooring. Carpet can trap animal skin flakes and dust.  Use allergy-proof pillows, mattress covers, and box spring covers.  Wash bed sheets and blankets every week in hot water and dry them in a dryer.  Use blankets that are made of polyester or cotton.  Clean bathrooms and kitchens with bleach. If possible, have someone repaint the walls in these rooms with mold-resistant paint. Keep out of the rooms that are being cleaned and painted.  Wash hands often. GET HELP IF:  You have make a whistling sound when breaking (wheeze), have shortness of breath, or have a cough even if taking medicine to prevent attacks.  The colored mucus you cough up (sputum) is thicker than usual.  The colored mucus you cough up changes from clear or white to yellow, green, gray, or bloody.  You have problems from the medicine you are taking such as:  A rash.  Itching.  Swelling.  Trouble breathing.  You need reliever medicines more than 2-3 times a week.  Your peak flow measurement is still at 50-79% of your personal best after following the action plan for 1 hour.  You have a fever. GET HELP RIGHT AWAY IF:   You seem to be worse and are not responding to medicine during an asthma attack.  You are short of breath even at rest.  You get short of breath when doing very little activity.  You have trouble eating, drinking, or talking.  You have chest pain.  You have a fast heartbeat.  Your lips or fingernails start to turn blue.  You are light-headed, dizzy, or faint.  Your peak flow is less than 50% of your personal best. MAKE SURE YOU:   Understand these instructions.  Will watch your condition.  Will get help right away if you are not doing well or get worse. Document Released: 07/23/2007 Document Revised: 06/20/2013 Document Reviewed: 09/02/2012 [facility] Patient Information 2015 [place], [place]. This information is not intended  to replace advice given to you by your health care provider. Make sure you discuss any questions you have with your health care provider.  Emergency Department Resource Guide 1) Find a Doctor and Pay Out of Pocket Although you won't have to find out who is covered by your insurance plan, it is a good idea to ask around and get recommendations. You will then need to call the office and see if the doctor you have chosen will accept you as a new patient and what types of options they offer for patients who are self-pay. Some doctors offer discounts or will set up payment plans for their patients who do not have insurance, but you will need to ask so you aren't surprised when you get to your appointment.  2) Contact Your Local Health Department Not all health departments have doctors that can see patients for sick visits, but many do, so it is worth a call to see if yours does. If you don't know where your local health department is, you can check in your phone book. The CDC also has a tool to help you locate your state's health department, and many state websites also have listings of all of their local health departments.  3) Find a Walk-in Clinic If your illness is not likely to be very severe or complicated, you may want to try a walk in clinic. These are popping up all over the country in pharmacies, drugstores, and shopping centers. They're usually staffed by nurse practitioners or physician assistants that have been trained to treat common illnesses and complaints. They're usually fairly quick and inexpensive. However, if you have serious medical issues or chronic medical problems, these are probably not your best option.  No Primary Care Doctor: - Call Health Connect at  [phone] - they can help you locate a primary care doctor that  accepts your insurance, provides certain services, etc. - Physician Referral Service- [phone]  Chronic Pain Problems: Organization         Address  Phone    Notes  [facility]  [phone] Patients need to be referred by their primary care doctor.   Medication Assistance: Organization         Address  Phone   Notes  [facility] Medication [facility] [address] [place] [place], [place] [address] [phone] --Must be a resident of [facility] -- Must have NO insurance coverage whatsoever (no Medicaid/ Medicare, etc.) -- The pt. MUST have a primary care doctor that directs their care regularly and follows them in the community   [facility]  [phone]   [employer]  [phone]    Agencies that provide inexpensive medical care: Organization         Address  Phone   Notes  [facility]  [phone]   [facility]    [phone]   [facility] [address] [place], [place] [address] (  [phone]) [phone]   [facility] [address], [place] [phone]   [facility]anned Parenthood    [phone](336) [phone]   [facility]    [phone]   [facility]  [address], [place] Phone:  [phone](336) [phone], Fax:  [phone](336) [phone] Hours of Operation:  9 am - 6 pm, M-F.  Also accepts Medicaid/Medicare and self-pay.  [facility]  [address], [place] Phone: [phone](336) [phone], Fax: [phone](336) [phone]. Hours of Operation:  8:30 am - 5:30 pm, M-F.  Also accepts Medicaid and self-pay.  [facility] [place] [address], [place] Phone: [phone](336) [phone]   [facility] [address], [place], [place] [phone], Ext. [phone] Mondays & Thursdays: 7-9 AM.  First 15 patients are seen on a first come, first serve basis.    Medicaid-accepting [facility] Providers:  Organization         Address  Phone   Notes  [facility] [address], [place] [phone]) [phone] Also accepts self-pay patients.  [facility]  [address], [place]  [phone]) [phone]   [facility] [address], [place] [phone]) [phone]   [facility] [address] [place] [address], [place] [phone](336) [phone]   Renaye RakersVeita Bland [address], [place]   [phone]) [phone] Only accepts [place] Access [place] patients after they have their name applied to their card.   Self-Pay (no insurance) in [facility]:  Organization         Address  Phone   Notes  Sickle Cell Patients, [facility] [address], [place] [phone](336) [phone]   [facility] [address], [place] [phone](336) [phone]   [facility]  [address], [place] [phone](336) [phone]   [facility]/Dr. Osei-Bonsu  [address] [place] [address], [place] or [phone], [place] [phone] Phone number for both [place] and [place] locations is the same.  [facility] and [facility] [address], [place] [phone]) [phone]   [facility] [place] [address] [place] [address], [place] or [address] [phone]) [phone]) [phone]   [facility] [address], [place] [phone]) [phone], phone; [phone](336) [phone], fax Sees patients 1st and 3rd Saturday of every month.  Must not qualify for public or private insurance (i.e. Medicaid, Medicare, [place] Health Choice, Veterans' Benefits)  Household income should be no more than 200% of the poverty level The clinic cannot treat you if you are pregnant or think you are pregnant  Sexually transmitted diseases are not treated at the clinic.    Dental Care: Organization         Address  Phone  Notes  [facility] of [facility] [address], [place] [phone]) [phone] Accepts children up to age 26 who are enrolled in [place] or [place] Health Choice; pregnant women with a Medicaid card; and children who have  applied for Medicaid or [place] Health Choice, but were declined, whose parents can pay a reduced fee at time of service.  [facility] of [facility] [place]  [address], [place] [phone] Accepts children up to age 26 who are enrolled in [place] or [place] Health Choice; pregnant women with  a Medicaid card; and children who have applied for Medicaid or [place] Health Choice, but were declined, whose parents can pay a reduced fee at time of service.  [facility]  [address] [place], [place] [phone] Patients are seen by appointment only. Walk-ins are not accepted. [facility] will see patients 82 years of age and older. Monday - Tuesday (8am-5pm) Most Wednesdays (8:30-5pm) $30 per visit, cash only  [facility]  [address], [facility] [phone] Patients are seen by appointment only. Walk-ins are not accepted. [facility] will see patients 45 years of age and older. One Wednesday Evening (Monthly: Volunteer Based).  $30 per visit, cash only  [employer]  [phone] for adults; Children under age 54, call Graduate Pediatric Dentistry at [phone]. Children aged 63-14, please call [phone] to request a pediatric application.  Dental services are provided in all areas of dental care including fillings, crowns and bridges, complete and partial dentures, implants, gum treatment, root canals, and extractions. Preventive care is also provided. Treatment is provided to both adults and children. Patients are selected via a lottery and there is often a waiting list.   [facility] [address], [place]  [phone] [email or web address]   [facility] [address] [place], [place] [phone], Ext. [phone] Second and Fourth Thursday of each month, opens at 6:30 AM; Clinic ends at 9 AM.  Patients are seen on a first-come first-served basis, and a  limited number are seen during each clinic.   [facility]  [address] [place], [place] [phone]   Eligibility Requirements You must have lived in [place], [place], or [place] counties for at least the last three months.   You cannot be eligible for state or federal sponsored [place], including [employer], [place], or [employer].   You generally cannot be eligible for healthcare insurance through your employer.    How to apply: Eligibility screenings are held every Tuesday and Wednesday afternoon from 1:00 pm until 4:00 pm. You do not need an appointment for the interview!  [facility] [address], [place], [place] [phone]   [facility] Health Department  [phone]   [facility] Health Department  [phone]   [facility] Health Department  [phone]    Behavioral Health Resources in the Community: Intensive Outpatient Programs Organization         Address  Phone  Notes  [facility] [address] [place], [place] [phone]   [facility] Outpatient [address] [place], [place] [phone]   [facility] [address] [place], [place]  [phone]   [facility] [address]  [place], [place] [phone] or [phone]   Substance Abuse Resources Organization         Address  Phone  Notes  Al[facility] Drug Services  [phone]   [facility]  [phone]   [facility] [place]  [phone]   Floydene Flock  [phone]   Residential & Outpatient Substance Abuse Program  [phone]   Psychological Services Organization         Address  Phone  Notes  [facility]  [phone]   [facility] Services  [phone]   [facility] [address] [place] [phone] or [phone]    Mobile Crisis Teams Organization  Address  Phone  Notes  [facility], Mobile Crisis Care Unit  [phone]   [facility]  [address] [place], [place] [phone]   [facility] [address] [place] [phone]    Self-Help/Support Groups Organization         Address  Phone             Notes  [facility] [place] - variety of support groups  336- [phone] Call for more information  Narcotics Anonymous (NA), [facility] [address], [place]  2 meetings at this location   Statisticianesidential Treatment Programs Organization         Address  Phone  Notes  [facility] [address],    [place]  [phone]   [facility]  [address], [place] [phone], [place], [place] [phone]   [facility] [address], [place] [phone] Admissions: 8am-3pm M-F  [facility] [address]    [place], [place] [phone]   [facility] [address], [place], [place] [phone]   [facility] [address]  [place], [place] [phone]   [facility] - Intensive Outpatient [address], [place], [place] [phone]   [facility] ([facility] [address]  [place], [place] [phone] or [phone]   [facility] ([facility]) [address] [place], [place] [phone] Accepts Medicaid  [facility] [address] [place] [address]  [place] [phone] Substance Abuse/Addiction Treatment   [facility] Behavioral Health Resources Organization         Address  Phone  Notes  [facility]  [phone]) [phone]   Angie FavaJulie Brannon, PhD [address], [address] [place], [place]   [phone](336) [phone] or [phone](336) [phone]   [facility]   [address] [place], [place] [phone](336) [phone]   [facility] [address], [place], [place] [phone](336) [phone] Insurance/Medicaid/sponsorship  through [facility]  [facility] Families [address]                                    [place], [place] [phone](336) [phone] Therapy/tele-psych/case  [facility] [address]   [place], [place] [phone](336) [phone]    Dr. Lolly MustacheArfeen  [phone](336) [phone]   [facility] [place]  [employer] [facility] 1) [address], [place] 2) [address], [place] 3)  [address], [place] [phone]) [phone](336) [phone]  [phone](336) [phone]   [facility] Child Abuse Hotline [phone](336) [phone] or [phone](336) [phone] (After Hours)

## 2014-01-09 NOTE — ED Provider Notes (Signed)
CSN: [id number]     Arrival date & time 01/09/14  0250 History  This chart was scribed for Tomasita CrumbleAdeleke Glendon Fiser, MD by Evon Slackerrance Branch, ED Scribe. This patient was seen in room A08C/A08C and the patient's care was started at 3:13 AM.    Chief Complaint  Patient presents with  . Asthma    Patient is a 28 y.o. male presenting with asthma. The history is provided by the patient. No language interpreter was used.  Asthma This is a chronic problem. The current episode started 1 to 2 hours ago. The problem occurs every several days. The problem has been gradually improving. Associated symptoms include chest pain. Nothing aggravates the symptoms. Treatments tried: albuterol inhaler    HPI Comments: Henry Hardin is a 28 y.o. male who presents to the Emergency Department complaining of asthma attack onset tonight. He states that his asthma has been worse of the past month due to the weather change. He states that he tried his inhaler with no relief with increasing frequency everyday. He states that had associated productive cough and chest tightness as if he is about to vomit. He has been in the ED every week for the past month because he is unable to fill his prescription for prednisone, which he states normally helps him a whole lot.  He is denying subjective fevers or feeling ill.  States that he has been admitted once for asthma 4 years ago to the ICU.   Past Medical History  Diagnosis Date  . Asthma    History reviewed. No pertinent past surgical history. No family history on file. History  Substance Use Topics  . Smoking status: Current Every Day Smoker -- 0.02 packs/day    Types: Cigarettes  . Smokeless tobacco: Never Used  . Alcohol Use: Yes     Comment: occasional    Review of Systems  Constitutional: Negative for fever and chills.  Respiratory: Positive for cough and chest tightness.   Cardiovascular: Positive for chest pain.  All other systems reviewed and are negative.   Allergies   No known allergies  Home Medications   Prior to Admission medications   Medication Sig Start Date End Date Taking? Authorizing Provider  acetaminophen (TYLENOL) 325 MG tablet Take 650 mg by mouth every 6 (six) hours as needed for mild pain.    Historical Provider, MD  albuterol (PROVENTIL HFA;VENTOLIN HFA) 108 (90 BASE) MCG/ACT inhaler Inhale 2 puffs into the lungs every 4 (four) hours as needed for wheezing or shortness of breath. 07/06/13   Lyanne CoKevin M Campos, MD  albuterol (PROVENTIL HFA;VENTOLIN HFA) 108 (90 BASE) MCG/ACT inhaler Inhale 2 puffs into the lungs every 4 (four) hours as needed for wheezing or shortness of breath. 11/22/13   Shon Batonourtney F Horton, MD  HYDROcodone-acetaminophen (NORCO/VICODIN) 5-325 MG per tablet Take 1-2 tablets by mouth every 4 (four) hours as needed for moderate pain or severe pain. 11/14/13   Harle BattiestElizabeth Tysinger, NP  ibuprofen (ADVIL,MOTRIN) 200 MG tablet Take 400 mg by mouth every 6 (six) hours as needed for mild pain.    Historical Provider, MD  predniSONE (DELTASONE) 20 MG tablet Take 3 tablets (60 mg total) by mouth daily. 01/02/14   Arnoldo HookerShari A Upstill, PA-C   Triage Vitals: BP 139/85 mmHg  Pulse 99  Temp(Src) 97.9 F (36.6 C) (Oral)  Resp 20  SpO2 98%  Physical Exam  Constitutional: He is oriented to person, place, and time. Vital signs are normal. He appears well-developed and well-nourished.  Non-toxic appearance.  He does not appear ill. He appears distressed.  HENT:  Head: Normocephalic and atraumatic.  Nose: Nose normal.  Mouth/Throat: Oropharynx is clear and moist. No oropharyngeal exudate.  Breathing treatment currently in place  Eyes: Conjunctivae and EOM are normal. Pupils are equal, round, and reactive to light. No scleral icterus.  Neck: Normal range of motion. Neck supple. No tracheal deviation, no edema, no erythema and normal range of motion present. No thyroid mass and no thyromegaly present.  Cardiovascular: Normal rate, regular rhythm, S1  normal, S2 normal, normal heart sounds, intact distal pulses and normal pulses.  Exam reveals no gallop and no friction rub.   No murmur heard. Pulses:      Radial pulses are 2+ on the right side, and 2+ on the left side.       Dorsalis pedis pulses are 2+ on the right side, and 2+ on the left side.  Pulmonary/Chest: He is in respiratory distress. He has wheezes. He has no rhonchi. He has no rales.  Abdominal: Soft. Normal appearance and bowel sounds are normal. He exhibits no distension, no ascites and no mass. There is no hepatosplenomegaly. There is no tenderness. There is no rebound, no guarding and no CVA tenderness.  Musculoskeletal: Normal range of motion. He exhibits no edema or tenderness.  Lymphadenopathy:    He has no cervical adenopathy.  Neurological: He is alert and oriented to person, place, and time. He has normal strength. No cranial nerve deficit or sensory deficit. He exhibits normal muscle tone. GCS eye subscore is 4. GCS verbal subscore is 5. GCS motor subscore is 6.  Skin: Skin is warm, dry and intact. No petechiae and no rash noted. He is not diaphoretic. No erythema. No pallor.  Psychiatric: He has a normal mood and affect. His behavior is normal. Judgment normal.  Nursing note and vitals reviewed.   ED Course  Procedures (including critical care time) DIAGNOSTIC STUDIES: Oxygen Saturation is 98% on RA, normal by my interpretation.    COORDINATION OF CARE: 3:16 AM-Discussed treatment plan with pt at bedside and pt agreed to plan.    Labs Review Labs Reviewed - No data to display  Imaging Review No results found.   EKG Interpretation None      MDM   Final diagnoses:  None    Patient presents to the ED for asthma exacerbation due to noncompliance and weather change.  He has had an ICU admission as well as multiple ED visits in the last month.  Will treat aggressively with nebs, steroids, IVF, magnesium.  CXR is not warranted given his history.  Repeat  exam reveals respiratory distress, tachypnea, and retractions have resolved,  He feels back to his baseline.  He was given a Rx for low dose prednisone as well as a shorter course to see if it is cheaper and he can afford to buy it to prevent reccurance.  I advised to patient to go and see how expensive it will be.  His inhaler was refilled in the ED as well.    His vital signs remain within normal limits and he is safe for DC.  CRITICAL CARE Performed by: Tomasita Crumble   Total critical care time: .  Critical care time was exclusive of separately billable procedures and treating other patients.  Critical care was necessary to treat or prevent imminent or life-threatening deterioration.  Critical care was time spent personally by me on the following activities: development of treatment plan with patient and/or surrogate as well  as nursing, discussions with consultants, evaluation of patient's response to treatment, examination of patient, obtaining history from patient or surrogate, ordering and performing treatments and interventions, ordering and review of laboratory studies, ordering and review of radiographic studies, pulse oximetry and re-evaluation of patient's condition.   I personally performed the services described in this documentation, which was scribed in my presence. The recorded information has been reviewed and is accurate.      Tomasita CrumbleAdeleke Fidel Caggiano, MD 01/09/14 [phone]

## 2014-01-09 NOTE — ED Notes (Signed)
Pt a/o x 4 with steady gait on d/c. 

## 2014-01-09 NOTE — ED Notes (Signed)
Pt. reports asthma attack this evening with productive cough , denies fever or chills.

## 2014-01-09 NOTE — ED Notes (Signed)
MD at bedside. 

## 2014-01-12 ENCOUNTER — Encounter (HOSPITAL_COMMUNITY): Payer: Self-pay | Admitting: Emergency Medicine

## 2014-01-12 ENCOUNTER — Emergency Department (HOSPITAL_COMMUNITY)
Admission: EM | Admit: 2014-01-12 | Discharge: 2014-01-12 | Disposition: A | Discharge status: Home / Self Care | Payer: Self-pay | Attending: Emergency Medicine | Admitting: Emergency Medicine

## 2014-01-12 DIAGNOSIS — J45901 Unspecified asthma with (acute) exacerbation: Secondary | ICD-10-CM | POA: Insufficient documentation

## 2014-01-12 DIAGNOSIS — Z720 Tobacco use: Secondary | ICD-10-CM | POA: Insufficient documentation

## 2014-01-12 DIAGNOSIS — J9801 Acute bronchospasm: Secondary | ICD-10-CM

## 2014-01-12 DIAGNOSIS — Z79899 Other long term (current) drug therapy: Secondary | ICD-10-CM | POA: Insufficient documentation

## 2014-01-12 MED ORDER — ACETAMINOPHEN 325 MG PO TABS
650.0000 mg | ORAL_TABLET | Freq: Once | ORAL | Status: AC
  Administered 2014-01-12: 650 mg via ORAL
  Filled 2014-01-12: qty 2

## 2014-01-12 MED ORDER — ALBUTEROL SULFATE HFA 108 (90 BASE) MCG/ACT IN AERS
2.0000 | INHALATION_SPRAY | RESPIRATORY_TRACT | Status: DC | PRN
  Administered 2014-01-12: 2 via RESPIRATORY_TRACT
  Filled 2014-01-12: qty 6.7

## 2014-01-12 MED ORDER — IPRATROPIUM BROMIDE 0.02 % IN SOLN
0.5000 mg | Freq: Once | RESPIRATORY_TRACT | Status: AC
  Administered 2014-01-12: 0.5 mg via RESPIRATORY_TRACT
  Filled 2014-01-12: qty 2.5

## 2014-01-12 MED ORDER — PREDNISONE 20 MG PO TABS
60.0000 mg | ORAL_TABLET | Freq: Once | ORAL | Status: AC
  Administered 2014-01-12: 60 mg via ORAL
  Filled 2014-01-12: qty 3

## 2014-01-12 MED ORDER — ALBUTEROL (5 MG/ML) CONTINUOUS INHALATION SOLN
15.0000 mg | INHALATION_SOLUTION | Freq: Once | RESPIRATORY_TRACT | Status: AC
  Administered 2014-01-12: 15 mg via RESPIRATORY_TRACT
  Filled 2014-01-12: qty 20

## 2014-01-12 MED ORDER — PREDNISONE 20 MG PO TABS: 20.0000 mg | ORAL_TABLET | Freq: Two times a day (BID) | ORAL | Status: DC

## 2014-01-12 NOTE — ED Notes (Signed)
Respiratory contacted about continuous neb treatment.

## 2014-01-12 NOTE — ED Notes (Signed)
Pt here for SOB. Pt reports being out of inhaler x 1 day. Pt states that he cannot afford his maintenance medication. Pt wheezing, tachypneic at this time.

## 2014-01-12 NOTE — Discharge Instructions (Signed)
Bronchospasm °A bronchospasm is a spasm or tightening of the airways going into the lungs. During a bronchospasm breathing becomes more difficult because the airways get smaller. When this happens there can be coughing, a whistling sound when breathing (wheezing), and difficulty breathing. Bronchospasm is often associated with asthma, but not all patients who experience a bronchospasm have asthma. °CAUSES  °A bronchospasm is caused by inflammation or irritation of the airways. The inflammation or irritation may be triggered by:  °· Allergies (such as to animals, pollen, food, or mold). Allergens that cause bronchospasm may cause wheezing immediately after exposure or many hours later.   °· Infection. Viral infections are believed to be the most common cause of bronchospasm.   °· Exercise.   °· Irritants (such as pollution, cigarette smoke, strong odors, aerosol sprays, and paint fumes).   °· Weather changes. Winds increase molds and pollens in the air. Rain refreshes the air by washing irritants out. Cold air may cause inflammation.   °· Stress and emotional upset.   °SIGNS AND SYMPTOMS  °· Wheezing.   °· Excessive nighttime coughing.   °· Frequent or severe coughing with a simple cold.   °· Chest tightness.   °· Shortness of breath.   °DIAGNOSIS  °Bronchospasm is usually diagnosed through a history and physical exam. Tests, such as chest X-rays, are sometimes done to look for other conditions. °TREATMENT  °· Inhaled medicines can be given to open up your airways and help you breathe. The medicines can be given using either an inhaler or a nebulizer machine. °· Corticosteroid medicines may be given for severe bronchospasm, usually when it is associated with asthma. °HOME CARE INSTRUCTIONS  °· Always have a plan prepared for seeking medical care. Know when to call your health care provider and local emergency services (911 in the [place] Know where you can access local emergency care. °· Only take medicines as  directed by your health care provider. °· If you were prescribed an inhaler or nebulizer machine, ask your health care provider to explain how to use it correctly. Always use a spacer with your inhaler if you were given one. °· It is necessary to remain calm during an attack. Try to relax and breathe more slowly.  °· Control your home environment in the following ways:   °¨ Change your heating and air conditioning filter at least once a month.   °¨ Limit your use of fireplaces and wood stoves. °¨ Do not smoke and do not allow smoking in your home.   °¨ Avoid exposure to perfumes and fragrances.   °¨ Get rid of pests (such as roaches and mice) and their droppings.   °¨ Throw away plants if you see mold on them.   °¨ Keep your house clean and dust free.   °¨ Replace carpet with wood, tile, or vinyl flooring. Carpet can trap dander and dust.   °¨ Use allergy-proof pillows, mattress covers, and box spring covers.   °¨ Wash bed sheets and blankets every week in hot water and dry them in a dryer.   °¨ Use blankets that are made of polyester or cotton.   °¨ Wash hands frequently. °SEEK MEDICAL CARE IF:  °· You have muscle aches.   °· You have chest pain.   °· The sputum changes from clear or white to yellow, green, gray, or bloody.   °· The sputum you cough up gets thicker.   °· There are problems that may be related to the medicine you are given, such as a rash, itching, swelling, or trouble breathing.   °SEEK IMMEDIATE MEDICAL CARE IF:  °· You have worsening wheezing and coughing even   after taking your prescribed medicines.   You have increased difficulty breathing.   You develop severe chest pain. MAKE SURE YOU:   Understand these instructions.  Will watch your condition.  Will get help right away if you are not doing well or get worse. Document Released: 02/06/2003 Document Revised: 02/08/2013 Document Reviewed: 07/26/2012 [facility] Patient Information 2015 [place], [place]. This information is not  intended to replace advice given to you by your health care provider. Make sure you discuss any questions you have with your health care provider.  Smoking Cessation, Tips for Success If you are ready to quit smoking, congratulations! You have chosen to help yourself be healthier. Cigarettes bring nicotine, tar, carbon monoxide, and other irritants into your body. Your lungs, heart, and blood vessels will be able to work better without these poisons. There are many different ways to quit smoking. Nicotine gum, nicotine patches, a nicotine inhaler, or nicotine nasal spray can help with physical craving. Hypnosis, support groups, and medicines help break the habit of smoking. WHAT THINGS CAN I DO TO MAKE QUITTING EASIER?  Here are some tips to help you quit for good:  Pick a date when you will quit smoking completely. Tell all of your friends and family about your plan to quit on that date.  Do not try to slowly cut down on the number of cigarettes you are smoking. Pick a quit date and quit smoking completely starting on that day.  Throw away all cigarettes.   Clean and remove all ashtrays from your home, work, and car.  On a card, write down your reasons for quitting. Carry the card with you and read it when you get the urge to smoke.  Cleanse your body of nicotine. Drink enough water and fluids to keep your urine clear or pale yellow. Do this after quitting to flush the nicotine from your body.  Learn to predict your moods. Do not let a bad situation be your excuse to have a cigarette. Some situations in your life might tempt you into wanting a cigarette.  Never have "just one" cigarette. It leads to wanting another and another. Remind yourself of your decision to quit.  Change habits associated with smoking. If you smoked while driving or when feeling stressed, try other activities to replace smoking. Stand up when drinking your coffee. Brush your teeth after eating. Sit in a different chair  when you read the paper. Avoid alcohol while trying to quit, and try to drink fewer caffeinated beverages. Alcohol and caffeine may urge you to smoke.  Avoid foods and drinks that can trigger a desire to smoke, such as sugary or spicy foods and alcohol.  Ask people who smoke not to smoke around you.  Have something planned to do right after eating or having a cup of coffee. For example, plan to take a walk or exercise.  Try a relaxation exercise to calm you down and decrease your stress. Remember, you may be tense and nervous for the first 2 weeks after you quit, but this will pass.  Find new activities to keep your hands busy. Play with a pen, coin, or rubber band. Doodle or draw things on paper.  Brush your teeth right after eating. This will help cut down on the craving for the taste of tobacco after meals. You can also try mouthwash.   Use oral substitutes in place of cigarettes. Try using lemon drops, carrots, cinnamon sticks, or chewing gum. Keep them handy so they are available when  you have the urge to smoke.  When you have the urge to smoke, try deep breathing.  Designate your home as a nonsmoking area.  If you are a heavy smoker, ask your health care provider about a prescription for nicotine chewing gum. It can ease your withdrawal from nicotine.  Reward yourself. Set aside the cigarette money you save and buy yourself something nice.  Look for support from others. Join a support group or smoking cessation program. Ask someone at home or at work to help you with your plan to quit smoking.  Always ask yourself, "Do I need this cigarette or is this just a reflex?" Tell yourself, "Today, I choose not to smoke," or "I do not want to smoke." You are reminding yourself of your decision to quit.  Do not replace cigarette smoking with electronic cigarettes (commonly called e-cigarettes). The safety of e-cigarettes is unknown, and some may contain harmful chemicals.  If you relapse,  do not give up! Plan ahead and think about what you will do the next time you get the urge to smoke. HOW WILL I FEEL WHEN I QUIT SMOKING? You may have symptoms of withdrawal because your body is used to nicotine (the addictive substance in cigarettes). You may crave cigarettes, be irritable, feel very hungry, cough often, get headaches, or have difficulty concentrating. The withdrawal symptoms are only temporary. They are strongest when you first quit but will go away within 10-14 days. When withdrawal symptoms occur, stay in control. Think about your reasons for quitting. Remind yourself that these are signs that your body is healing and getting used to being without cigarettes. Remember that withdrawal symptoms are easier to treat than the major diseases that smoking can cause.  Even after the withdrawal is over, expect periodic urges to smoke. However, these cravings are generally short lived and will go away whether you smoke or not. Do not smoke! WHAT RESOURCES ARE AVAILABLE TO HELP ME QUIT SMOKING? Your health care provider can direct you to community resources or hospitals for support, which may include:  Group support.  Education.  Hypnosis.  Therapy. Document Released: 11/02/2003 Document Revised: 06/20/2013 Document Reviewed: 07/22/2012 [facility] Patient Information 2015 [place], [place]. This information is not intended to replace advice given to you by your health care provider. Make sure you discuss any questions you have with your health care provider.  You Can Quit Smoking If you are ready to quit smoking or are thinking about it, congratulations! You have chosen to help yourself be healthier and live longer! There are lots of different ways to quit smoking. Nicotine gum, nicotine patches, a nicotine inhaler, or nicotine nasal spray can help with physical craving. Hypnosis, support groups, and medicines help break the habit of smoking. TIPS TO GET OFF AND STAY OFF CIGARETTES  Learn  to predict your moods. Do not let a bad situation be your excuse to have a cigarette. Some situations in your life might tempt you to have a cigarette.  Ask friends and co-workers not to smoke around you.  Make your home smoke-free.  Never have "just one" cigarette. It leads to wanting another and another. Remind yourself of your decision to quit.  On a card, make a list of your reasons for not smoking. Read it at least the same number of times a day as you have a cigarette. Tell yourself everyday, "I do not want to smoke. I choose not to smoke."  Ask someone at home or work to help you with  your plan to quit smoking.  Have something planned after you eat or have a cup of coffee. Take a walk or get other exercise to perk you up. This will help to keep you from overeating.  Try a relaxation exercise to calm you down and decrease your stress. Remember, you may be tense and nervous the first two weeks after you quit. This will pass.  Find new activities to keep your hands busy. Play with a pen, coin, or rubber band. Doodle or draw things on paper.  Brush your teeth right after eating. This will help cut down the craving for the taste of tobacco after meals. You can try mouthwash too.  Try gum, breath mints, or diet candy to keep something in your mouth. IF YOU SMOKE AND WANT TO QUIT:  Do not stock up on cigarettes. Never buy a carton. Wait until one pack is finished before you buy another.  Never carry cigarettes with you at work or at home.  Keep cigarettes as far away from you as possible. Leave them with someone else.  Never carry matches or a lighter with you.  Ask yourself, "Do I need this cigarette or is this just a reflex?"  Bet with someone that you can quit. Put cigarette money in a piggy bank every morning. If you smoke, you give up the money. If you do not smoke, by the end of the week, you keep the money.  Keep trying. It takes 21 days to change a habit!  Talk to your  doctor about using medicines to help you quit. These include nicotine replacement gum, lozenges, or skin patches. Document Released: 11/30/2008 Document Revised: 04/28/2011 Document Reviewed: 11/30/2008 [facility] Patient Information 2015 [place], [place]. This information is not intended to replace advice given to you by your health care provider. Make sure you discuss any questions you have with your health care provider.

## 2014-01-12 NOTE — ED Provider Notes (Addendum)
CSN: [id number]     Arrival date & time 01/12/14  1233 History   First MD Initiated Contact with Patient 01/12/14 1257     Chief Complaint  Patient presents with  . Shortness of Breath     (Consider location/radiation/quality/duration/timing/severity/associated sxs/prior Treatment) HPI  Henry Hardin is a 28 y.o. male who is here for recurrent dyspnea.  This time.  He states it has been present for 40 minutes.  He does not have an inhaler to use when he gets into trouble, at this time.  He does not have a PCP, and has been seen several times within the last month for similar problems in the emergency department.  He does not take prednisone that is prescribed because he "cannot afford it."  He has some mild chest discomfort but no nausea, vomiting, diaphoresis, weakness or dizziness.  There are no other known modifying factors   Past Medical History  Diagnosis Date  . Asthma    History reviewed. No pertinent past surgical history. History reviewed. No pertinent family history. History  Substance Use Topics  . Smoking status: Current Every Day Smoker -- 0.02 packs/day    Types: Cigarettes  . Smokeless tobacco: Never Used  . Alcohol Use: Yes     Comment: occasional    Review of Systems  All other systems reviewed and are negative.     Allergies  No known allergies  Home Medications   Prior to Admission medications   Medication Sig Start Date End Date Taking? Authorizing Provider  acetaminophen (TYLENOL) 325 MG tablet Take 650 mg by mouth every 6 (six) hours as needed for mild pain.   Yes Historical Provider, MD  albuterol (PROVENTIL HFA;VENTOLIN HFA) 108 (90 BASE) MCG/ACT inhaler Inhale 2 puffs into the lungs every 4 (four) hours as needed for wheezing or shortness of breath. 11/22/13  Yes Shon Batonourtney F Horton, MD  HYDROcodone-acetaminophen (NORCO/VICODIN) 5-325 MG per tablet Take 1-2 tablets by mouth every 4 (four) hours as needed for moderate pain or severe pain. 11/14/13   Yes Harle BattiestElizabeth Tysinger, NP  ibuprofen (ADVIL,MOTRIN) 200 MG tablet Take 400 mg by mouth every 6 (six) hours as needed for mild pain.   Yes Historical Provider, MD  predniSONE (DELTASONE) 20 MG tablet Take 2 tablets (40 mg total) by mouth daily. 01/09/14  Yes Adeleke Mora Bellmanni, MD   BP 147/83 mmHg  Pulse 90  Temp(Src) 98.4 F (36.9 C) (Oral)  Resp 26  Ht 6\' 2"  (1.88 m)  Wt 180 lb (81.647 kg)  BMI 23.10 kg/m2  SpO2 94% Physical Exam  Constitutional: He is oriented to person, place, and time. He appears well-developed and well-nourished.  HENT:  Head: Normocephalic and atraumatic.  Right Ear: External ear normal.  Left Ear: External ear normal.  Eyes: Conjunctivae and EOM are normal. Pupils are equal, round, and reactive to light.  Neck: Normal range of motion and phonation normal. Neck supple.  Cardiovascular: Normal rate, regular rhythm and normal heart sounds.   Pulmonary/Chest: Breath sounds normal. He is in respiratory distress (moderate increased work of breathing.). He exhibits no bony tenderness.  Decreased air movement bilaterally with generalized wheezing.  No rhonchi.  Musculoskeletal: Normal range of motion. He exhibits no edema or tenderness.  Neurological: He is alert and oriented to person, place, and time. No cranial nerve deficit or sensory deficit. He exhibits normal muscle tone. Coordination normal.  Skin: Skin is warm, dry and intact.  Psychiatric: He has a normal mood and affect. His behavior is  normal. Judgment and thought content normal.  Nursing note and vitals reviewed.   ED Course  Procedures (including critical care time)  Continuous albuterol nebulizer, and oral prednisone, ordered for respiratory distress associated with recurrent bronchospasm.  Medications  albuterol (PROVENTIL,VENTOLIN) solution continuous neb (15 mg Nebulization Given 01/12/14 1312)  ipratropium (ATROVENT) nebulizer solution 0.5 mg (0.5 mg Nebulization Given 01/12/14 1312)  predniSONE  (DELTASONE) tablet 60 mg (60 mg Oral Given 01/12/14 1322)    Patient Vitals for the past 24 hrs:  BP Temp Temp src Pulse Resp SpO2 Height Weight  01/12/14 1312 - - - - - 94 % - -  01/12/14 1256 147/83 mmHg 98.4 F (36.9 C) Oral 90 26 92 % - -  01/12/14 1253 - - - - - - 6\' 2"  (1.88 m) 180 lb (81.647 kg)  01/12/14 1247 147/83 mmHg - - 89 (!) 28 93 % - -    2:51 PM Reevaluation with update and discussion. After initial assessment and treatment, an updated evaluation reveals much more comfortable now. No respiratory distress. Findings discussed with patient. Henry Hardin   CRITICAL CARE Performed by: Mancel BaleWENTZ,Allex Lapoint Hardin Total critical care time: 35 minutes Critical care time was exclusive of separately billable procedures and treating other patients. Critical care was necessary to treat or prevent imminent or life-threatening deterioration. Critical care was time spent personally by me on the following activities: development of treatment plan with patient and/or surrogate as well as nursing, discussions with consultants, evaluation of patient's response to treatment, examination of patient, obtaining history from patient or surrogate, ordering and performing treatments and interventions, ordering and review of laboratory studies, ordering and review of radiographic studies, pulse oximetry and re-evaluation of patient's condition.  Labs Review Labs Reviewed - No data to display  Imaging Review No results found.   EKG Interpretation  Date/Time:  Thursday January 12 2014 12:46:38 EST Ventricular Rate:  89 PR Interval:  141 QRS Duration: 88 QT Interval:  335 QTC Calculation: 408 R Axis:   68 Text Interpretation:  Sinus rhythm Artifact Baseline wander in lead(s) II III aVF since last tracing no significant change Confirmed by Effie ShyWENTZ  MD, Mechele CollinELLIOTT ([id number]) on 01/12/2014 3:18:31 PM         MDM   Final diagnoses:  Bronchospasm  Tobacco abuse    Recurrent bronchospasm with  medication noncompliance and continued smoking.   Nursing Notes Reviewed/ Care Coordinated Applicable Imaging Reviewed Interpretation of Laboratory Data incorporated into ED treatment  The patient appears reasonably screened and/or stabilized for discharge and I doubt any other medical condition or other [facility] requiring further screening, evaluation, or treatment in the ED at this time prior to discharge.  Plan: Home Medications- Prednisone and Albuterol; Home Treatments- Stop smoking; return here if the recommended treatment, does not improve the symptoms; Recommended follow up- PCP prn    Flint MelterElliott Hardin Raliegh Scobie, MD 01/12/14 1515  Flint MelterElliott Hardin Briar Sword, MD 01/12/14 1519    Flint MelterElliott Hardin Kazuki Ingle, MD 01/18/14 1525   Flint MelterElliott Hardin Asencion Loveday, MD 01/24/14 [id number]  Flint MelterElliott Hardin Advaith Lamarque, MD 01/24/14 1946

## 2014-01-12 NOTE — ED Notes (Signed)
Pt refusing to wear nasal cannula at this time.

## 2014-01-12 NOTE — ED Notes (Signed)
Pt states that he can't take pills at this time.

## 2014-01-18 ENCOUNTER — Emergency Department: Payer: Self-pay | Admitting: Emergency Medicine

## 2014-01-23 ENCOUNTER — Encounter (HOSPITAL_COMMUNITY): Payer: Self-pay | Admitting: Emergency Medicine

## 2014-01-23 ENCOUNTER — Emergency Department (HOSPITAL_COMMUNITY)
Admission: EM | Admit: 2014-01-23 | Discharge: 2014-01-23 | Disposition: A | Discharge status: Home / Self Care | Payer: Self-pay | Attending: Emergency Medicine | Admitting: Emergency Medicine

## 2014-01-23 DIAGNOSIS — J4521 Mild intermittent asthma with (acute) exacerbation: Secondary | ICD-10-CM | POA: Insufficient documentation

## 2014-01-23 MED ORDER — ALBUTEROL SULFATE (2.5 MG/3ML) 0.083% IN NEBU
5.0000 mg | INHALATION_SOLUTION | Freq: Once | RESPIRATORY_TRACT | Status: AC
Start: 2014-01-23 — End: 2014-01-23
  Administered 2014-01-23: 5 mg via RESPIRATORY_TRACT
  Filled 2014-01-23: qty 6

## 2014-01-23 MED ORDER — PREDNISONE 20 MG PO TABS
60.0000 mg | ORAL_TABLET | Freq: Once | ORAL | Status: AC
  Administered 2014-01-23: 60 mg via ORAL
  Filled 2014-01-23: qty 3

## 2014-01-23 MED ORDER — ALBUTEROL SULFATE HFA 108 (90 BASE) MCG/ACT IN AERS
2.0000 | INHALATION_SPRAY | RESPIRATORY_TRACT | Status: DC | PRN
  Administered 2014-01-23: 2 via RESPIRATORY_TRACT
  Filled 2014-01-23: qty 6.7

## 2014-01-23 MED ORDER — PREDNISONE 20 MG PO TABS: 60.0000 mg | ORAL_TABLET | Freq: Every day | ORAL | Status: DC

## 2014-01-23 NOTE — ED Notes (Signed)
Patient complaining of trouble breathing.

## 2014-01-23 NOTE — ED Provider Notes (Signed)
CSN: [id number]     Arrival date & time 01/23/14  0419 History   First MD Initiated Contact with Patient 01/23/14 [phone]     Chief Complaint  Patient presents with  . Asthma    patient complaining of trouble breathing.      (Consider location/radiation/quality/duration/timing/severity/associated sxs/prior Treatment) Patient is a 28 y.o. male presenting with asthma. The history is provided by the patient. No language interpreter was used.  Asthma This is a new problem. The current episode started yesterday. Associated symptoms include coughing. Pertinent negatives include no chills or fever. Associated symptoms comments: Wheezing for the past 3 days without fever. He has been working in a dusty environment and feels this set off his asthma. He ran out of his inhaler several days ago. No nebulizer at home. Marland Kitchen.    History reviewed. No pertinent past medical history. No past surgical history on file. History reviewed. No pertinent family history. History  Substance Use Topics  . Smoking status: Not on file  . Smokeless tobacco: Not on file  . Alcohol Use: Not on file    Review of Systems  Constitutional: Negative for fever and chills.  HENT: Negative.   Respiratory: Positive for cough, shortness of breath and wheezing.   Cardiovascular: Negative.   Gastrointestinal: Negative.   Musculoskeletal: Negative.   Skin: Negative.   Neurological: Negative.       Allergies  Review of patient's allergies indicates no known allergies.  Home Medications   Prior to Admission medications   Medication Sig Start Date End Date Taking? Authorizing Provider  acetaminophen (TYLENOL) 500 MG tablet Take 1,000 mg by mouth every 6 (six) hours as needed for moderate pain.   Yes Historical Provider, MD  albuterol (PROVENTIL HFA;VENTOLIN HFA) 108 (90 BASE) MCG/ACT inhaler Inhale 2 puffs into the lungs every 6 (six) hours as needed for wheezing or shortness of breath.   Yes Historical Provider, MD   ibuprofen (ADVIL,MOTRIN) 200 MG tablet Take 400 mg by mouth every 6 (six) hours as needed for moderate pain.   Yes Historical Provider, MD   BP 142/75 mmHg  Pulse 80  Temp(Src) 97.9 F (36.6 C) (Oral)  Resp 20  SpO2 99% Physical Exam  Constitutional: He is oriented to person, place, and time. He appears well-developed and well-nourished.  HENT:  Head: Normocephalic.  Neck: Normal range of motion. Neck supple.  Cardiovascular: Normal rate and regular rhythm.   Pulmonary/Chest: Effort normal and breath sounds normal.  Examined after one nebulizer treatment given with Albuterol/Atrovent.   Wheezing, greater on right.   Abdominal: Soft. Bowel sounds are normal. There is no tenderness. There is no rebound and no guarding.  Musculoskeletal: Normal range of motion.  Neurological: He is alert and oriented to person, place, and time.  Skin: Skin is warm and dry. No rash noted.  Psychiatric: He has a normal mood and affect.    ED Course  Procedures (including critical care time) Labs Review Labs Reviewed - No data to display  Imaging Review No results found.   EKG Interpretation None      MDM   Final diagnoses:  None    1. Asthma exacerbation  Improved after multiple treatments with nebulized albuterol in the ED. Steroids started. He reports significantly improved breathing, no difficulty. Maintaining 96% O2 saturation while ambulating. Stable for discharge.     Arnoldo HookerShari A Fady Stamps, PA-C 01/25/14 2112  Olivia Mackielga M Otter, MD 01/27/14 [phone]

## 2014-01-23 NOTE — Discharge Instructions (Signed)
Asthma °Asthma is a condition of the lungs in which the airways tighten and narrow. Asthma can make it hard to breathe. Asthma cannot be cured, but medicine and lifestyle changes can help control it. Asthma may be started (triggered) by: °· Animal skin flakes (dander). °· Dust. °· Cockroaches. °· Pollen. °· Mold. °· Smoke. °· Cleaning products. °· Hair sprays or aerosol sprays. °· Paint fumes or strong smells. °· Cold air, weather changes, and winds. °· Crying or laughing hard. °· Stress. °· Certain medicines or drugs. °· Foods, such as dried fruit, potato chips, and sparkling grape juice. °· Infections or conditions (colds, flu). °· Exercise. °· Certain medical conditions or diseases. °· Exercise or tiring activities. °HOME CARE  °· Take medicine as told by your doctor. °· Use a peak flow meter as told by your doctor. A peak flow meter is a tool that measures how well the lungs are working. °· Record and keep track of the peak flow meter's readings. °· Understand and use the asthma action plan. An asthma action plan is a written plan for taking care of your asthma and treating your attacks. °· To help prevent asthma attacks: °· Do not smoke. Stay away from secondhand smoke. °· Change your heating and air conditioning filter often. °· Limit your use of fireplaces and wood stoves. °· Get rid of pests (such as roaches and mice) and their droppings. °· Throw away plants if you see mold on them. °· Clean your floors. Dust regularly. Use cleaning products that do not smell. °· Have someone vacuum when you are not home. Use a vacuum cleaner with a HEPA filter if possible. °· Replace carpet with wood, tile, or vinyl flooring. Carpet can trap animal skin flakes and dust. °· Use allergy-proof pillows, mattress covers, and box spring covers. °· Wash bed sheets and blankets every week in hot water and dry them in a dryer. °· Use blankets that are made of polyester or cotton. °· Clean bathrooms and kitchens with bleach. If  possible, have someone repaint the walls in these rooms with mold-resistant paint. Keep out of the rooms that are being cleaned and painted. °· Wash hands often. °GET HELP IF: °· You have make a whistling sound when breaking (wheeze), have shortness of breath, or have a cough even if taking medicine to prevent attacks. °· The colored mucus you cough up (sputum) is thicker than usual. °· The colored mucus you cough up changes from clear or white to yellow, green, gray, or bloody. °· You have problems from the medicine you are taking such as: °· A rash. °· Itching. °· Swelling. °· Trouble breathing. °· You need reliever medicines more than 2-3 times a week. °· Your peak flow measurement is still at 50-79% of your personal best after following the action plan for 1 hour. °· You have a fever. °GET HELP RIGHT AWAY IF:  °· You seem to be worse and are not responding to medicine during an asthma attack. °· You are short of breath even at rest. °· You get short of breath when doing very little activity. °· You have trouble eating, drinking, or talking. °· You have chest pain. °· You have a fast heartbeat. °· Your lips or fingernails start to turn blue. °· You are light-headed, dizzy, or faint. °· Your peak flow is less than 50% of your personal best. °MAKE SURE YOU:  °· Understand these instructions. °· Will watch your condition. °· Will get help right away if you   are not doing well or get worse. Document Released: 07/23/2007 Document Revised: 06/20/2013 Document Reviewed: 09/02/2012 [facility] Patient Information 2015 [employer] [place]. This information is not intended to replace advice given to you by your health care provider. Make sure you discuss any questions you have with your health care provider. Smoking Cessation Quitting smoking is important to your health and has many advantages. However, it is not always easy to quit since nicotine is a very addictive drug. Oftentimes, people try 3 times or more before being  able to quit. This document explains the best ways for you to prepare to quit smoking. Quitting takes hard work and a lot of effort, but you can do it. ADVANTAGES OF QUITTING SMOKING  You will live longer, feel better, and live better.  Your body will feel the impact of quitting smoking almost immediately.  Within 20 minutes, blood pressure decreases. Your pulse returns to its normal level.  After 8 hours, carbon monoxide levels in the blood return to normal. Your oxygen level increases.  After 24 hours, the chance of having a heart attack starts to decrease. Your breath, hair, and body stop smelling like smoke.  After 48 hours, damaged nerve endings begin to recover. Your sense of taste and smell improve.  After 72 hours, the body is virtually free of nicotine. Your bronchial tubes relax and breathing becomes easier.  After 2 to 12 weeks, lungs can hold more air. Exercise becomes easier and circulation improves.  The risk of having a heart attack, stroke, cancer, or lung disease is greatly reduced.  After 1 year, the risk of coronary heart disease is cut in half.  After 5 years, the risk of stroke falls to the same as a nonsmoker.  After 10 years, the risk of lung cancer is cut in half and the risk of other cancers decreases significantly.  After 15 years, the risk of coronary heart disease drops, usually to the level of a nonsmoker.  If you are pregnant, quitting smoking will improve your chances of having a healthy baby.  The people you live with, especially any children, will be healthier.  You will have extra money to spend on things other than cigarettes. QUESTIONS TO THINK ABOUT BEFORE ATTEMPTING TO QUIT You may want to talk about your answers with your health care provider.  Why do you want to quit?  If you tried to quit in the past, what helped and what did not?  What will be the most difficult situations for you after you quit? How will you plan to handle  them?  Who can help you through the tough times? Your family? Friends? A health care provider?  What pleasures do you get from smoking? What ways can you still get pleasure if you quit? Here are some questions to ask your health care provider:  How can you help me to be successful at quitting?  What medicine do you think would be best for me and how should I take it?  What should I do if I need more help?  What is smoking withdrawal like? How can I get information on withdrawal? GET READY  Set a quit date.  Change your environment by getting rid of all cigarettes, ashtrays, matches, and lighters in your home, car, or work. Do not let people smoke in your home.  Review your past attempts to quit. Think about what worked and what did not. GET SUPPORT AND ENCOURAGEMENT You have a better chance of being successful if you  have help. You can get support in many ways.  Tell your family, friends, and coworkers that you are going to quit and need their support. Ask them not to smoke around you.  Get individual, group, or telephone counseling and support. Programs are available at [facility]. Call your local health department for information about programs in your area.  Spiritual beliefs and practices may help some smokers quit.  Download a "quit meter" on your computer to keep track of quit statistics, such as how long you have gone without smoking, cigarettes not smoked, and money saved.  Get a self-help book about quitting smoking and staying off tobacco. LEARN NEW SKILLS AND BEHAVIORS  Distract yourself from urges to smoke. Talk to someone, go for a walk, or occupy your time with a task.  Change your normal routine. Take a different route to work. Drink tea instead of coffee. Eat breakfast in a different place.  Reduce your stress. Take a hot bath, exercise, or read a book.  Plan something enjoyable to do every day. Reward yourself for not  smoking.  Explore interactive web-based programs that specialize in helping you quit. GET MEDICINE AND USE IT CORRECTLY Medicines can help you stop smoking and decrease the urge to smoke. Combining medicine with the above behavioral methods and support can greatly increase your chances of successfully quitting smoking.  Nicotine replacement therapy helps deliver nicotine to your body without the negative effects and risks of smoking. Nicotine replacement therapy includes nicotine gum, lozenges, inhalers, nasal sprays, and skin patches. Some may be available over-the-counter and others require a prescription.  Antidepressant medicine helps people abstain from smoking, but how this works is unknown. This medicine is available by prescription.  Nicotinic receptor partial agonist medicine simulates the effect of nicotine in your brain. This medicine is available by prescription. Ask your health care provider for advice about which medicines to use and how to use them based on your health history. Your health care provider will tell you what side effects to look out for if you choose to be on a medicine or therapy. Carefully read the information on the package. Do not use any other product containing nicotine while using a nicotine replacement product.  RELAPSE OR DIFFICULT SITUATIONS Most relapses occur within the first 3 months after quitting. Do not be discouraged if you start smoking again. Remember, most people try several times before finally quitting. You may have symptoms of withdrawal because your body is used to nicotine. You may crave cigarettes, be irritable, feel very hungry, cough often, get headaches, or have difficulty concentrating. The withdrawal symptoms are only temporary. They are strongest when you first quit, but they will go away within 10-14 days. To reduce the chances of relapse, try to:  Avoid drinking alcohol. Drinking lowers your chances of successfully quitting.  Reduce the  amount of caffeine you consume. Once you quit smoking, the amount of caffeine in your body increases and can give you symptoms, such as a rapid heartbeat, sweating, and anxiety.  Avoid smokers because they can make you want to smoke.  Do not let weight gain distract you. Many smokers will gain weight when they quit, usually less than 10 pounds. Eat a healthy diet and stay active. You can always lose the weight gained after you quit.  Find ways to improve your mood other than smoking. FOR MORE INFORMATION  www.smokefree.gov  Document Released: 01/28/2001 Document Revised: 06/20/2013 Document Reviewed: 05/15/2011 [facility] Patient Information 2015 [place]. This information is not intended to replace advice given to you by your health care provider. Make sure you discuss any questions you have with your health care provider.

## 2014-01-24 ENCOUNTER — Encounter (HOSPITAL_COMMUNITY): Payer: Self-pay | Admitting: Emergency Medicine

## 2014-02-03 ENCOUNTER — Encounter (HOSPITAL_COMMUNITY): Payer: Self-pay | Admitting: Emergency Medicine

## 2014-02-03 ENCOUNTER — Emergency Department (HOSPITAL_COMMUNITY): Payer: Self-pay

## 2014-02-03 ENCOUNTER — Emergency Department (HOSPITAL_COMMUNITY)
Admission: EM | Admit: 2014-02-03 | Discharge: 2014-02-03 | Disposition: A | Discharge status: Home / Self Care | Payer: Self-pay | Attending: Emergency Medicine | Admitting: Emergency Medicine

## 2014-02-03 DIAGNOSIS — Z720 Tobacco use: Secondary | ICD-10-CM | POA: Insufficient documentation

## 2014-02-03 DIAGNOSIS — R062 Wheezing: Secondary | ICD-10-CM

## 2014-02-03 DIAGNOSIS — J45901 Unspecified asthma with (acute) exacerbation: Secondary | ICD-10-CM | POA: Insufficient documentation

## 2014-02-03 DIAGNOSIS — Z7952 Long term (current) use of systemic steroids: Secondary | ICD-10-CM | POA: Insufficient documentation

## 2014-02-03 DIAGNOSIS — Z79899 Other long term (current) drug therapy: Secondary | ICD-10-CM | POA: Insufficient documentation

## 2014-02-03 MED ORDER — PREDNISONE 20 MG PO TABS: ORAL_TABLET | ORAL | Status: DC

## 2014-02-03 MED ORDER — ALBUTEROL SULFATE HFA 108 (90 BASE) MCG/ACT IN AERS
2.0000 | INHALATION_SPRAY | Freq: Once | RESPIRATORY_TRACT | Status: AC
  Administered 2014-02-03: 2 via RESPIRATORY_TRACT
  Filled 2014-02-03: qty 6.7

## 2014-02-03 MED ORDER — ALBUTEROL (5 MG/ML) CONTINUOUS INHALATION SOLN
10.0000 mg/h | INHALATION_SOLUTION | RESPIRATORY_TRACT | Status: DC
  Administered 2014-02-03: 10 mg/h via RESPIRATORY_TRACT
  Filled 2014-02-03: qty 20

## 2014-02-03 MED ORDER — PSEUDOEPHEDRINE HCL 60 MG PO TABS
60.0000 mg | ORAL_TABLET | Freq: Once | ORAL | Status: AC
  Administered 2014-02-03: 60 mg via ORAL
  Filled 2014-02-03: qty 1

## 2014-02-03 MED ORDER — PSEUDOEPHEDRINE HCL 60 MG PO TABS: 60.0000 mg | ORAL_TABLET | Freq: Three times a day (TID) | ORAL | Status: DC | PRN

## 2014-02-03 NOTE — ED Notes (Signed)
[facility] presents with a 28 yo male from home with an asthma exacerbation while sleeping.  Pt attempted to use rescue inhaler but medication portion of inhaler was missing.  Pt initially had inspiratory and expiratory wheezes bilaterally with accessory muscle use; however, after [facility] administered Duo-neb and 125 mg of Solumedrol, pt wheezing reduced and no accessory muscle use.  Pt states he feels much better at this time.

## 2014-02-03 NOTE — ED Provider Notes (Signed)
CSN: [id number]     Arrival date & time 02/03/14  0158 History   First MD Initiated Contact with Patient 02/03/14 0207     Chief Complaint  Patient presents with  . Asthma     (Consider location/radiation/quality/duration/timing/severity/associated sxs/prior Treatment) HPI  Henry Hardin is a 28 y.o. male with past medical history of asthma coming in with an exacerbation. Patient states his trigger is postnasal drip or he is sleeping at night. This occurs all year round. His inhaler tonight, he was given a DuoNeb and steroids by EMS. He currently states that he is feeling better. Has had a productive cough, but no subjective fevers. He is currently denying chest pain or shortness of breath. Patient has no further complaints.  10 Systems reviewed and are negative for acute change except as noted in the HPI.     Past Medical History  Diagnosis Date  . Asthma    History reviewed. No pertinent past surgical history. History reviewed. No pertinent family history. History  Substance Use Topics  . Smoking status: Current Every Day Smoker    Types: Cigarettes  . Smokeless tobacco: Not on file  . Alcohol Use: Yes     Comment: occasional    Review of Systems    Allergies  No known allergies  Home Medications   Prior to Admission medications   Medication Sig Start Date End Date Taking? Authorizing Provider  acetaminophen (TYLENOL) 325 MG tablet Take 650 mg by mouth every 6 (six) hours as needed for mild pain.    Historical Provider, MD  acetaminophen (TYLENOL) 500 MG tablet Take 1,000 mg by mouth every 6 (six) hours as needed for moderate pain.    Historical Provider, MD  albuterol (PROVENTIL HFA;VENTOLIN HFA) 108 (90 BASE) MCG/ACT inhaler Inhale 2 puffs into the lungs every 4 (four) hours as needed for wheezing or shortness of breath. 11/22/13   Shon Batonourtney F Horton, MD  albuterol (PROVENTIL HFA;VENTOLIN HFA) 108 (90 BASE) MCG/ACT inhaler Inhale 2 puffs into the lungs every 6 (six)  hours as needed for wheezing or shortness of breath.    Historical Provider, MD  ibuprofen (ADVIL,MOTRIN) 200 MG tablet Take 400 mg by mouth every 6 (six) hours as needed for moderate pain.    Historical Provider, MD  predniSONE (DELTASONE) 20 MG tablet Take 1 tablet (20 mg total) by mouth 2 (two) times daily. 01/12/14   Flint MelterElliott L Wentz, MD  predniSONE (DELTASONE) 20 MG tablet Take 3 tablets (60 mg total) by mouth daily. 01/23/14   Shari A Upstill, PA-C   BP 128/75 mmHg  Pulse 71  Temp(Src) 98.7 F (37.1 C) (Oral)  Resp 18  SpO2 100% Physical Exam  Constitutional: He is oriented to person, place, and time. Vital signs are normal. He appears well-developed and well-nourished.  Non-toxic appearance. He does not appear ill. No distress.  HENT:  Head: Normocephalic and atraumatic.  Nose: Nose normal.  Mouth/Throat: Oropharynx is clear and moist. No oropharyngeal exudate.  Eyes: Conjunctivae and EOM are normal. Pupils are equal, round, and reactive to light. No scleral icterus.  Neck: Normal range of motion. Neck supple. No tracheal deviation, no edema, no erythema and normal range of motion present. No thyroid mass and no thyromegaly present.  Cardiovascular: Normal rate, regular rhythm, S1 normal, S2 normal, normal heart sounds, intact distal pulses and normal pulses.  Exam reveals no gallop and no friction rub.   No murmur heard. Pulses:      Radial pulses are 2+  on the right side, and 2+ on the left side.       Dorsalis pedis pulses are 2+ on the right side, and 2+ on the left side.  Pulmonary/Chest: Effort normal. No respiratory distress. He has wheezes. He has no rhonchi. He has no rales.  Right sided wheezing  Abdominal: Soft. Normal appearance and bowel sounds are normal. He exhibits no distension, no ascites and no mass. There is no hepatosplenomegaly. There is no tenderness. There is no rebound, no guarding and no CVA tenderness.  Musculoskeletal: Normal range of motion. He exhibits no  edema or tenderness.  Lymphadenopathy:    He has no cervical adenopathy.  Neurological: He is alert and oriented to person, place, and time. He has normal strength. No cranial nerve deficit or sensory deficit. He exhibits normal muscle tone. GCS eye subscore is 4. GCS verbal subscore is 5. GCS motor subscore is 6.  Skin: Skin is warm, dry and intact. No petechiae and no rash noted. He is not diaphoretic. No erythema. No pallor.  Psychiatric: He has a normal mood and affect. His behavior is normal. Judgment normal.  Nursing note and vitals reviewed.   ED Course  Procedures (including critical care time) Labs Review Labs Reviewed - No data to display  Imaging Review Dg Chest 2 View  02/03/2014   CLINICAL DATA:  Asthma with wheezing and shortness of breath  EXAM: CHEST  2 VIEW  COMPARISON:  01/02/2014  FINDINGS: Normal heart size and mediastinal contours. No acute infiltrate or edema. No effusion or pneumothorax. Chronic mild concavity of the sternum. No acute osseous findings.  IMPRESSION: No active cardiopulmonary disease.   Electronically Signed   By: Tiburcio PeaJonathan  Watts M.D.   On: 02/03/2014 03:12     EKG Interpretation None      MDM   Final diagnoses:  Wheezing    Patient presents emergency department for asthma exacerbation. He states he feels much better after EMS intervention, my exam reveals isolated wheezing on the right. Will obtain chest x-ray for evaluation. Patient will receive 1 more breathing treatment as well as Sudafed for decongestant.   Upon repeat assessment, patient states Sudafed medicine helped with his congestion. His pulmonary exam is now normal without any wheezing. He was given albuterol inhaler to go home with. Will write a prescription for prednisone and Sudafed to take for treatment at home. He is advised to get a primary care physician for follow-up and for a maintenance medication. Is also has memory within his normal limits he is safe for  discharge.    Tomasita CrumbleAdeleke Kaiah Hosea, MD 02/03/14 [phone]

## 2014-02-03 NOTE — Discharge Instructions (Signed)
[name], you were seen today for asthma. Take albuterol around-the-clock for the next 2 days, then as needed. Take steroids and Sudafed as prescribed. Follow-up with her regular physician within 3 days for continued management.  If your symptoms worsen come back to emergency department immediately. Thank you. Asthma is a condition of the lungs in which the airways tighten and narrow. Asthma can make it hard to breathe. Asthma cannot be cured, but medicine and lifestyle changes can help control it. Asthma may be started (triggered) by:  Animal skin flakes (dander).  Dust.  Cockroaches.  Pollen.  Mold.  Smoke.  Cleaning products.  Hair sprays or aerosol sprays.  Paint fumes or strong smells.  Cold air, weather changes, and winds.  Crying or laughing hard.  Stress.  Certain medicines or drugs.  Foods, such as dried fruit, potato chips, and sparkling grape juice.  Infections or conditions (colds, flu).  Exercise.  Certain medical conditions or diseases.  Exercise or tiring activities. HOME CARE   Take medicine as told by your doctor.  Use a peak flow meter as told by your doctor. A peak flow meter is a tool that measures how well the lungs are working.  Record and keep track of the peak flow meter's readings.  Understand and use the asthma action plan. An asthma action plan is a written plan for taking care of your asthma and treating your attacks.  To help prevent asthma attacks:  Do not smoke. Stay away from secondhand smoke.  Change your heating and air conditioning filter often.  Limit your use of fireplaces and wood stoves.  Get rid of pests (such as roaches and mice) and their droppings.  Throw away plants if you see mold on them.  Clean your floors. Dust regularly. Use cleaning products that do not smell.  Have someone vacuum when you are not home. Use a vacuum cleaner with a HEPA filter if possible.  Replace carpet with wood, tile, or  vinyl flooring. Carpet can trap animal skin flakes and dust.  Use allergy-proof pillows, mattress covers, and box spring covers.  Wash bed sheets and blankets every week in hot water and dry them in a dryer.  Use blankets that are made of polyester or cotton.  Clean bathrooms and kitchens with bleach. If possible, have someone repaint the walls in these rooms with mold-resistant paint. Keep out of the rooms that are being cleaned and painted.  Wash hands often. GET HELP IF:  You have make a whistling sound when breaking (wheeze), have shortness of breath, or have a cough even if taking medicine to prevent attacks.  The colored mucus you cough up (sputum) is thicker than usual.  The colored mucus you cough up changes from clear or white to yellow, green, gray, or bloody.  You have problems from the medicine you are taking such as:  A rash.  Itching.  Swelling.  Trouble breathing.  You need reliever medicines more than 2-3 times a week.  Your peak flow measurement is still at 50-79% of your personal best after following the action plan for 1 hour.  You have a fever. GET HELP RIGHT AWAY IF:   You seem to be worse and are not responding to medicine during an asthma attack.  You are short of breath even at rest.  You get short of breath when doing very little activity.  You have trouble eating, drinking, or talking.  You have chest pain.  You have a fast heartbeat.  Your lips or fingernails start to turn blue.  You are light-headed, dizzy, or faint.  Your peak flow is less than 50% of your personal best. MAKE SURE YOU:   Understand these instructions.  Will watch your condition.  Will get help right away if you are not doing well or get worse. Document Released: 07/23/2007 Document Revised: 06/20/2013 Document Reviewed: 09/02/2012 [facility] Patient Information 2015 [place], [place]. This information is not intended to replace advice given to you by your health  care provider. Make sure you discuss any questions you have with your health care provider.

## 2014-02-03 NOTE — ED Notes (Signed)
Bed: [id number] Expected date:  Expected time:  Means of arrival:  Comments: EMS asthma - wheezing, on HHN and getting Solumedrol

## 2014-02-03 NOTE — ED Notes (Signed)
Pt continues to have his continuous neb, in no acute distress.

## 2014-02-17 ENCOUNTER — Encounter (HOSPITAL_COMMUNITY): Payer: Self-pay | Admitting: *Deleted

## 2014-02-17 ENCOUNTER — Emergency Department (HOSPITAL_COMMUNITY)
Admission: EM | Admit: 2014-02-17 | Discharge: 2014-02-17 | Disposition: A | Discharge status: Home / Self Care | Payer: Self-pay | Attending: Emergency Medicine | Admitting: Emergency Medicine

## 2014-02-17 DIAGNOSIS — Z760 Encounter for issue of repeat prescription: Secondary | ICD-10-CM | POA: Insufficient documentation

## 2014-02-17 DIAGNOSIS — R062 Wheezing: Secondary | ICD-10-CM

## 2014-02-17 DIAGNOSIS — Z720 Tobacco use: Secondary | ICD-10-CM | POA: Insufficient documentation

## 2014-02-17 DIAGNOSIS — Z79899 Other long term (current) drug therapy: Secondary | ICD-10-CM | POA: Insufficient documentation

## 2014-02-17 DIAGNOSIS — Z7952 Long term (current) use of systemic steroids: Secondary | ICD-10-CM | POA: Insufficient documentation

## 2014-02-17 DIAGNOSIS — J4520 Mild intermittent asthma, uncomplicated: Secondary | ICD-10-CM | POA: Insufficient documentation

## 2014-02-17 MED ORDER — ALBUTEROL SULFATE HFA 108 (90 BASE) MCG/ACT IN AERS
2.0000 | INHALATION_SPRAY | RESPIRATORY_TRACT | Status: DC | PRN
  Administered 2014-02-17: 2 via RESPIRATORY_TRACT
  Filled 2014-02-17: qty 6.7

## 2014-02-17 MED ORDER — AEROCHAMBER PLUS W/MASK MISC
1.0000 | Freq: Once | Status: AC
  Administered 2014-02-17: 1
  Filled 2014-02-17: qty 1

## 2014-02-17 MED ORDER — ALBUTEROL SULFATE HFA 108 (90 BASE) MCG/ACT IN AERS: 2.0000 | INHALATION_SPRAY | RESPIRATORY_TRACT | Status: DC | PRN

## 2014-02-17 NOTE — ED Notes (Signed)
Patient needs inhaler refill before leaving to go out of town

## 2014-02-17 NOTE — ED Provider Notes (Signed)
CSN: [id number]     Arrival date & time 02/17/14  2124 History   None    Chief Complaint  Patient presents with  . Medication Refill   The history is provided by the patient and medical records. No language interpreter was used.   This chart was scribed for non-physician practitioner Dierdre Forth, PA-C,  working with Flint Melter, MD, by Andrew Au, ED Scribe. This patient was seen in room TR10C/TR10C and the patient's care was started at 9:47 PM.  Henry Hardin is a 29 y.o. male who presents to the Emergency Department complaining of a medication refill. Pt states he is on his way to [place] but is in need of a refill of his inhaler. Pt states he has had mild wheezing today but realized he was almost out of his albuterol MDI approx 1 hour ago. Pt denies fever, chills, cough, nasal congestion and postnasal drip. Pt denies daily medication.   Past Medical History  Diagnosis Date  . Asthma    History reviewed. No pertinent past surgical history. No family history on file. History  Substance Use Topics  . Smoking status: Current Every Day Smoker    Types: Cigarettes  . Smokeless tobacco: Not on file  . Alcohol Use: Yes     Comment: occasional    Review of Systems  Constitutional: Negative for fever, chills, diaphoresis, appetite change, fatigue and unexpected weight change.  HENT: Negative for congestion, mouth sores and postnasal drip.   Eyes: Negative for visual disturbance.  Respiratory: Positive for wheezing. Negative for cough, chest tightness and shortness of breath.   Cardiovascular: Negative for chest pain.  Gastrointestinal: Negative for nausea, vomiting, abdominal pain, diarrhea and constipation.  Endocrine: Negative for polydipsia, polyphagia and polyuria.  Genitourinary: Negative for dysuria, urgency, frequency and hematuria.  Musculoskeletal: Negative for back pain and neck stiffness.  Skin: Negative for rash.  Allergic/Immunologic: Negative for  immunocompromised state.  Neurological: Negative for syncope, light-headedness and headaches.  Hematological: Does not bruise/bleed easily.  Psychiatric/Behavioral: Negative for sleep disturbance. The patient is not nervous/anxious.     Allergies  No known allergies  Home Medications   Prior to Admission medications   Medication Sig Start Date End Date Taking? Authorizing Provider  acetaminophen (TYLENOL) 500 MG tablet Take 1,000 mg by mouth every 6 (six) hours as needed for moderate pain.    Historical Provider, MD  albuterol (PROVENTIL HFA;VENTOLIN HFA) 108 (90 BASE) MCG/ACT inhaler Inhale 2 puffs into the lungs every 4 (four) hours as needed for wheezing or shortness of breath. 02/17/14   Simmie Garin, PA-C  predniSONE (DELTASONE) 20 MG tablet 2 tabs po daily x 4 days 02/03/14   Tomasita Crumble, MD  pseudoephedrine (SUDAFED) 60 MG tablet Take 1 tablet (60 mg total) by mouth every 8 (eight) hours as needed for congestion. 02/03/14   Tomasita Crumble, MD   BP 128/66 mmHg  Pulse 80  Temp(Src) 98.3 F (36.8 C) (Oral)  Resp 18  Ht  (1.88 m)  Wt 190 lb (86.183 kg)  BMI 24.38 kg/m2  SpO2 98% Physical Exam  Constitutional: He is oriented to person, place, and time. He appears well-developed and well-nourished. No distress.  Awake, alert, nontoxic appearance  HENT:  Head: Normocephalic and atraumatic.  Right Ear: Tympanic membrane, external ear and ear canal normal.  Left Ear: Tympanic membrane, external ear and ear canal normal.  Nose: Mucosal edema and rhinorrhea present. No epistaxis. Right sinus exhibits no maxillary sinus tenderness and no  frontal sinus tenderness. Left sinus exhibits no maxillary sinus tenderness and no frontal sinus tenderness.  Mouth/Throat: Uvula is midline, oropharynx is clear and moist and mucous membranes are normal. Mucous membranes are not pale and not cyanotic. No oropharyngeal exudate, posterior oropharyngeal edema, posterior oropharyngeal erythema or  tonsillar abscesses.  Eyes: Conjunctivae are normal. Pupils are equal, round, and reactive to light. No scleral icterus.  Neck: Normal range of motion and full passive range of motion without pain. Neck supple.  Cardiovascular: Normal rate, regular rhythm, normal heart sounds and intact distal pulses.   Pulmonary/Chest: Effort normal. No stridor. No respiratory distress. He has wheezes. He exhibits no tenderness.  Equal chest expansion Very mild, scattered expiratory wheezes throughout  Abdominal: Soft. Bowel sounds are normal. He exhibits no mass. There is no tenderness. There is no rebound and no guarding.  Musculoskeletal: Normal range of motion. He exhibits no edema.  Lymphadenopathy:    He has no cervical adenopathy.  Neurological: He is alert and oriented to person, place, and time.  Speech is clear and goal oriented Moves extremities without ataxia  Skin: Skin is warm and dry. No rash noted. He is not diaphoretic.  Psychiatric: He has a normal mood and affect.  Nursing note and vitals reviewed.   ED Course  Procedures (including critical care time) DIAGNOSTIC STUDIES: Oxygen Saturation is 98% on RA, normal by my interpretation.    COORDINATION OF CARE: 10:33 PM- Pt advised of plan for treatment and pt agrees.  Labs Review Labs Reviewed - No data to display  Imaging Review No results found.   EKG Interpretation None      MDM   Final diagnoses:  Medication refill  Asthma, mild intermittent, uncomplicated  Wheezing   Henry Hardin presents with need for albuterol MDI refill.  Patient without current signs of respiratory distress, very mild, scattered expiratory wheezes. Patient reports that he is breathing almost at baseline.  No hypoxia. Lung exam improved after MDI treatment. Patient given albuterol MDI here in the emergency department and prescription written for refill. Do not believe the patient is having an asthma exacerbation, will not give prescribed  prednisone. Pt has been instructed to continue using prescribed medications and to speak with PCP about today's exacerbation.   I have personally reviewed patient's vitals, nursing note and any pertinent labs or imaging.  I performed an focused physical exam; undressed when appropriate .    It has been determined that no acute conditions requiring further emergency intervention are present at this time. The patient/guardian have been advised of the diagnosis and plan. I reviewed any labs and imaging including any potential incidental findings. We have discussed signs and symptoms that warrant return to the ED and they are listed in the discharge instructions.    Vital signs are stable at discharge.   BP 128/66 mmHg  Pulse 80  Temp(Src) 98.3 F (36.8 C) (Oral)  Resp 18  Ht  (1.88 m)  Wt 190 lb (86.183 kg)  BMI 24.38 kg/m2  SpO2 98%   I personally performed the services described in this documentation, which was scribed in my presence. The recorded information has been reviewed and is accurate.    Dahlia Client Dayana Dalporto, PA-C 02/17/14 2233  Flint Melter, MD 02/18/14 [phone]

## 2014-02-17 NOTE — Discharge Instructions (Signed)
1. Medications: albuterol MDI, usual home medications 2. Treatment: rest, drink plenty of fluids,  3. Follow Up: Please followup with your primary doctor in 3 days for discussion of your diagnoses and further evaluation after today's visit; if you do not have a primary care doctor use the resource guide provided to find one; Please return to the ER for difficulty breathing, high fevers or other concerning symptoms     Emergency Department Resource Guide 1) Find a Doctor and Pay Out of Pocket Although you won't have to find out who is covered by your insurance plan, it is a good idea to ask around and get recommendations. You will then need to call the office and see if the doctor you have chosen will accept you as a new patient and what types of options they offer for patients who are self-pay. Some doctors offer discounts or will set up payment plans for their patients who do not have insurance, but you will need to ask so you aren't surprised when you get to your appointment.  2) Contact Your Local Health Department Not all health departments have doctors that can see patients for sick visits, but many do, so it is worth a call to see if yours does. If you don't know where your local health department is, you can check in your phone book. The CDC also has a tool to help you locate your state's health department, and many state websites also have listings of all of their local health departments.  3) Find a Walk-in Clinic If your illness is not likely to be very severe or complicated, you may want to try a walk in clinic. These are popping up all over the country in pharmacies, drugstores, and shopping centers. They're usually staffed by nurse practitioners or physician assistants that have been trained to treat common illnesses and complaints. They're usually fairly quick and inexpensive. However, if you have serious medical issues or chronic medical problems, these are probably not your best  option.  No Primary Care Doctor: - Call Health Connect at  [phone] - they can help you locate a primary care doctor that  accepts your insurance, provides certain services, etc. - Physician Referral Service- [phone]  Chronic Pain Problems: Organization         Address  Phone   Notes  [facility]  [phone] Patients need to be referred by their primary care doctor.   Medication Assistance: Organization         Address  Phone   Notes  [facility] Medication [facility] [address] [place] [place], [place] [address] [phone] --Must be a resident of [facility] -- Must have NO insurance coverage whatsoever (no Medicaid/ Medicare, etc.) -- The pt. MUST have a primary care doctor that directs their care regularly and follows them in the community   [facility]  [phone]   [employer]  [phone]    Agencies that provide inexpensive medical care: Organization         Address  Phone   Notes  [facility]  [phone]   [facility]    [phone]   [facility] [address] [place], [place] [address] [phone]   [facility] [address] [place]. [address] [place] [phone]   Planned Parenthood    [phone]   [facility]    [phone]   [facility] ° [address], [place] Phone:  [phone], Fax:  [phone] Hours of Operation:  9 am - 6 pm, M-F.  Also accepts Medicaid/Medicare and self-pay.  °[facility] ° [address], [place] Phone: [phone], Fax: [phone]. Hours of Operation:  8:30 am - 5:30 pm, M-F.  Also accepts Medicaid and self-pay.  °[facility] [place] [address], [place] Phone: ([phone]   °[facility] [address], [place], [place] [phone], Ext. [phone] Mondays & Thursdays: 7-9 AM.  First 15  patients are seen on a first come, first serve basis. °  ° °Medicaid-accepting [place] Providers: ° °Organization         Address  Phone   Notes  °[facility] [address], [place] [phone] Also accepts self-pay patients.  °[facility] [address], [place] ° ([phone]) [phone]   °[facility] [address], [place] [phone]) [phone]   °[facility] [address] [place] [address], [place] [phone]) [phone]   °Veita Bland [address], [place]  ° [phone] Only accepts [place] Access Medicaid patients after they have their name applied to their card.  ° °Self-Pay (no insurance) in [place]: ° °Organization         Address  Phone   Notes  °Sickle Cell Patients, [facility] [address], [place] [phone]) [phone]   °[facility] [address], [place] [phone]) [phone]   °[facility] ° [address], [place] ([phone]) [phone]   °[facility]/Dr. Osei-Bonsu ° [address] [place] [address], [place] or [address], [place] [phone] Phone number for both [place] and [place] locations is the same.  °[facility] [address], [place] [phone]) [phone]   °[facility] [address] [place] [address], [place] or [address] [phone]) [phone] °([phone]) [phone]   °[facility] [address], [place] ([phone]) [phone], phone; ([phone], fax Sees patients 1st and 3rd Saturday of every month.  Must not qualify for public or private insurance (i.e. Medicaid, Medicare, [place] Health Choice, Veterans' Benefits) • Household income should be no more than 200% of the poverty level •The clinic cannot treat you if you are pregnant or think you are pregnant • Sexually transmitted diseases are not treated at the clinic.  ° ° °Dental  Care: °Organization         Address  Phone  Notes  °[place] [facility] [address], [place] ([phone]) [phone] Accepts children up to age 21 who are enrolled in Medicaid or [place] Health Choice; pregnant women with a Medicaid card; and children who have applied for Medicaid or [place] Health Choice, but were declined, whose parents can pay a reduced fee at time of service.  °[place] [facility] [place]  [address], [place] ([phone]) [phone] Accepts children up to age 21 who are enrolled in Medicaid or [place] Health Choice; pregnant women with a Medicaid card; and children who have applied for Medicaid or [place] Health Choice, but were declined, whose parents can pay a reduced fee at time of service.  °[facility] ° [address],   [place] [phone] Patients are seen by appointment only. Walk-ins are not accepted. [place] will see patients 51 years of age and older. Monday - Tuesday (8am-5pm) Most Wednesdays (8:30-5pm) $30 per visit, cash only  [facility]  [address], [facility] [phone] Patients are seen by appointment only. Walk-ins are not accepted. [place] will see patients 14 years of age and older. One Wednesday Evening (Monthly: Volunteer Based).  $30 per visit, cash only  [place]  [phone] for adults; Children under age 72, call Graduate Pediatric Dentistry at [phone]. Children aged 20-14, please call [phone] to request a pediatric application.  Dental services are provided in all areas of dental care including fillings, crowns and bridges, complete and partial dentures, implants, gum treatment, root canals, and extractions. Preventive care is also provided. Treatment is provided to both adults and children. Patients are selected via a lottery and there is often a waiting list.   [facility] [address], [place]  [phone] [email or web address]   [facility] [address] [place], [place] [phone], Ext. 123 Second and Fourth Thursday of each month, opens at 6:30 AM; Clinic ends at 9 AM.  Patients are seen on a first-come first-served basis, and a limited number are seen during each clinic.   [facility]  [address] Hillard Danker [place], [place] [phone]   Eligibility Requirements You must have lived in [place], [place], or [place] counties for at least the last three months.   You cannot be eligible for state or federal sponsored [employer], including [employer], [place], or [employer].   You generally cannot be eligible for healthcare insurance through your employer.    How to apply: Eligibility screenings are held every Tuesday and Wednesday afternoon from 1:00 pm until 4:00 pm. You do not need an appointment for the interview!  [facility] [address] [place], [place]   [place]  [place] Department  [place]  [phone]    Behavioral Health Resources in the Community: Intensive Outpatient Programs Organization         Address  Phone  Notes  [facility] [place]. [address] [place], [place] [phone]   [facility] [address] [place], [place]   [facility] [address] [place], [place]   [place] [address]  [place], [place] or [phone]   Substance Abuse Resources Organization         Address  Phone  Notes  [facility]  [phone]   [place]  [phone]   [facility] [place]   Chinita Pester  [phone]   Residential & Outpatient Substance Abuse Program  [phone])[phone]    Psychological Services Organization         Address  Phone  Notes  [facility] [place]  [place]  [phone]   [place] [address] [place] or ([phone]    Mobile Crisis Teams Organization         Address  Phone  Notes  [facility], Mobile Crisis Care Unit  [phone]   [facility]  [address] [place], [place]   [place] Levels [phone]  [address] [place] [phone]    Self-Help/Support Groups Organization         Address  Phone             Notes  [facility] [place] - variety of support groups  [place] Call for more information  Narcotics Anonymous (NA), [facility] [address], [employer] [place]  2 meetings at this location   Special educational needs teacher         Address  Phone  Notes  [facility] [place],    [place]  [phone]   [facility]  [address] [place] [id number], [place], [place]   [place] [address], [place] [phone] Admissions: 8am-3pm M-F  Incentives Substance [place] [address],    [place], [place] [phone]   [facility] [address] [place], [place], [place]   [facility] ([place]) [address],  [place], [place]   [facility] - Intensive Outpatient [address], [place], [place]   [facility] [place] ([place].,  [place], [place] [phone] or [phone]   [facility] ([facility]) [address] [place], [place] Accepts Medicaid  [facility] [place] [address],  [place] [phone])[phone] Substance Abuse/Addiction Treatment   [facility] Organization         Address  Phone  Notes  [facility]  [phone]   Domenic Schwab, PhD [address] [place], [place]   [phone] or [phone]   [facility] [place], [place] [phone]   [facility] [address] [place], [place] [phone] Insurance/Medicaid/sponsorship through [facility] and Families [address]                                    [place], [place] [phone] [place] [address], [place] [phone]    Dr. Adele Schilder  [phone]   [facility] Dept. 1) [address] [place] 2) [place] 3)  [place] [address], [place] [phone]  [phone]   [place] [phone] or [phone] (After Hours)

## 2014-03-06 ENCOUNTER — Emergency Department (HOSPITAL_COMMUNITY)
Admission: EM | Admit: 2014-03-06 | Discharge: 2014-03-06 | Disposition: A | Discharge status: Home / Self Care | Payer: Self-pay | Attending: Emergency Medicine | Admitting: Emergency Medicine

## 2014-03-06 ENCOUNTER — Encounter (HOSPITAL_COMMUNITY): Payer: Self-pay | Admitting: Emergency Medicine

## 2014-03-06 DIAGNOSIS — Z720 Tobacco use: Secondary | ICD-10-CM | POA: Insufficient documentation

## 2014-03-06 DIAGNOSIS — Z79899 Other long term (current) drug therapy: Secondary | ICD-10-CM | POA: Insufficient documentation

## 2014-03-06 DIAGNOSIS — Z7952 Long term (current) use of systemic steroids: Secondary | ICD-10-CM | POA: Insufficient documentation

## 2014-03-06 DIAGNOSIS — Z760 Encounter for issue of repeat prescription: Secondary | ICD-10-CM | POA: Insufficient documentation

## 2014-03-06 DIAGNOSIS — J45909 Unspecified asthma, uncomplicated: Secondary | ICD-10-CM | POA: Insufficient documentation

## 2014-03-06 MED ORDER — ALBUTEROL SULFATE HFA 108 (90 BASE) MCG/ACT IN AERS: 1.0000 | INHALATION_SPRAY | Freq: Four times a day (QID) | RESPIRATORY_TRACT | Status: DC | PRN

## 2014-03-06 NOTE — Discharge Instructions (Signed)
Call for a follow up appointment with a Family or Primary Care Provider.  °Return if Symptoms worsen.   °Take medication as prescribed.  ° ° °Emergency Department Resource Guide °1) Find a Doctor and Pay Out of Pocket °Although you won't have to find out who is covered by your insurance plan, it is a good idea to ask around and get recommendations. You will then need to call the office and see if the doctor you have chosen will accept you as a new patient and what types of options they offer for patients who are self-pay. Some doctors offer discounts or will set up payment plans for their patients who do not have insurance, but you will need to ask so you aren't surprised when you get to your appointment. ° °2) Contact Your Local Health Department °Not all health departments have doctors that can see patients for sick visits, but many do, so it is worth a call to see if yours does. If you don't know where your local health department is, you can check in your phone book. The CDC also has a tool to help you locate your state's health department, and many state websites also have listings of all of their local health departments. ° °3) Find a Walk-in Clinic °If your illness is not likely to be very severe or complicated, you may want to try a walk in clinic. These are popping up all over the country in pharmacies, drugstores, and shopping centers. They're usually staffed by nurse practitioners or physician assistants that have been trained to treat common illnesses and complaints. They're usually fairly quick and inexpensive. However, if you have serious medical issues or chronic medical problems, these are probably not your best option. ° °No Primary Care Doctor: °- Call [facility] at  832-8000 - they can help you locate a primary care doctor that  accepts your insurance, provides certain services, etc. °- Physician Referral Service- 1-800-533-3463 ° °Chronic Pain Problems: °Organization         Address  Phone    Notes  °[facility]  (336) 297-2271 Patients need to be referred by their primary care doctor.  ° °Medication Assistance: °Organization         Address  Phone   Notes  °[place] [facility] [address] °[place], [place] [address] (336) 641-8030 --Must be a resident of [place] °-- Must have NO insurance coverage whatsoever (no Medicaid/ Medicare, etc.) °-- The pt. MUST have a primary care doctor that directs their care regularly and follows them in the community °  °[facility]  (866) 331-1348   °[employer]  (888) 892-1162   ° °Agencies that provide inexpensive medical care: °Organization         Address  Phone   Notes  °[facility]  (336) 832-8035   °[facility]    (336) 832-7272   °[facility] [address] °[place], [place] [address] (336) 832-4777   °[facility] [address], °[place] (336) 271-4999   °[facility]    (336) 373-0678   °[facility]    (336) 272-1050   °[facility] ° [address], [place] Phone:  (336) 832-4444, Fax:  (336) 832-4440 Hours of Operation:  9 am - 6 pm, M-F.  Also accepts Medicaid/Medicare and self-pay.  °[facility] ° [address], [place]   Phone: (336) 832-3150, Fax: (336) 832-3151. Hours of Operation:  8:30 am - 5:30 pm, M-F.  Also accepts Medicaid and self-pay.  °[facility] [place] [address], [place] Phone: (336) 878-6027   °[facility] [address], [place], [place] (336)723-1848, Ext. 123 Mondays & Thursdays: 7-9 AM.  First 15 patients are seen on a first come, first serve basis. °  ° °Medicaid-accepting [place] Providers: ° °Organization         Address  Phone   Notes  °[facility] [address], [place] (336) 641-2100 Also accepts self-pay patients.  °[facility]  [address], [place] ° (336) 856-9996   °[facility] [address], [place] (336) 288-8857   °[facility] [address] [place] [address], [place] (336) 299-7000   °Veita Bland [address], [place]  ° (336) 373-1557 Only accepts [place] Access Medicaid patients after they have their name applied to their card.  ° °Self-Pay (no insurance) in [place]: ° °Organization         Address  Phone   Notes  °Sickle Cell Patients, [facility] [address], [place] (336) 832-1970   °[facility] [address], [place] (336) 832-4400   °[facility] [place] ° [address], [place] (336) 992-4800   °[facility]/Dr. Osei-Bonsu ° [address] [place] [address], [place] or [address], [place] (336) 841-8500 Phone number for both [place] and [place] locations is the same.  °[facility] [address], [place] (336) 299-0000   °[facility] [address] [place] [address], [place] or [address] (336) 852-7530 °(336) 878-2260   °[facility] [address], [place] (336) 350-1642, phone; (336) 294-5005, fax Sees patients 1st and 3rd Saturday of every month.  Must not qualify for public or private insurance (i.e. Medicaid, Medicare, [place] Health Choice, Veterans' Benefits) • Household income should be no more than 200% of the poverty level •The clinic cannot treat you if you are pregnant or think you are pregnant • Sexually transmitted diseases are not treated at the clinic.  ° ° °Dental Care: °Organization         Address  Phone  Notes  °[facility] [address], [place] (336) 641-6152 Accepts children up to age 21 who are enrolled in Medicaid or [place] Health Choice; pregnant women with a Medicaid card; and children who have  applied for Medicaid or [place] Health Choice, but were declined, whose parents can pay a reduced fee at time of service.  °[facility] [place]  [address], [place] (336) 641-7733 Accepts children up to age 21 who are enrolled in Medicaid or [place] Health Choice; pregnant women with a Medicaid card; and children who have applied for Medicaid or  Health Choice, but were declined, whose parents can pay a reduced fee at time of service.  °[facility] ° [address],  (336) 641-4533 Patients are seen by appointment only. Walk-ins are not accepted. [facility] will see patients 18 years of age and older. °Monday - Tuesday (8am-5pm) °Most Wednesdays (8:30-5pm) °$30 per visit, cash only  °[facility] ° [address], [place] (336) 641-4533 Patients are seen by appointment only. Walk-ins are not accepted. [facility] will see patients 18 years of age and older. °One Wednesday Evening (Monthly: Volunteer Based).  $30 per visit, cash only  °[facility]  (919) 537-3737 for adults; Children under age 4, call Graduate Pediatric Dentistry at (919) [phone]. Children aged 4-14, please call (919) [phone] to request a pediatric application. ° Dental services are provided in all areas of dental care including fillings, crowns and bridges, complete and partial dentures, implants, gum treatment, root canals, and extractions. Preventive care is also provided. Treatment is provided to both adults and children. °Patients are selected via a lottery and there is often a waiting list. °  °[facility] [address], °[place] ° (336) 763-8833 [email or web address] °  °[facility] [address], [place], [place] (336)723-1848, Ext. 123 Second and Fourth Thursday of each month, opens at 6:30 AM; Clinic ends at 9 AM.  Patients are seen on a first-come first-served basis, and a  limited number are seen during each clinic.  ° °[facility] ° [address], [place], [place] (336) 723-7904   Eligibility Requirements °You must have lived in [place], [place], or [place] counties for at least the last three months. °  You cannot be eligible for state or federal sponsored healthcare insurance, including Veterans Administration, Medicaid, or Medicare. °  You generally cannot be eligible for healthcare insurance through your employer.  °  How to apply: °Eligibility screenings are held every Tuesday and Wednesday afternoon from 1:00 pm until 4:00 pm. You do not need an appointment for the interview!  °[facility] [address], [place], [place] 336-631-2330   °[facility]  336-342-8273   °[facility]  336-703-3100   °[facility]  336-570-6415   ° °Behavioral Health Resources in the Community: °Intensive Outpatient Programs °Organization         Address  Phone  Notes  °[facility] [address], [place], [place] 336-878-6098   °[facility] [address], [place], [place] 336-832-9800   °[facility] [address], [place], [place] ° 336-882-2125   °[facility] [address],  °[place], [place] 1-800-853-5163 or 336-641-4981   °Substance Abuse Resources °Organization         Address  Phone  Notes  °[facility]  336-882-2125   °[facility]  336-784-9470   °[facility]  336-285-9073   °[facility]  336-845-3988   °Residential & Outpatient Substance Abuse Program  1-800-659-3381   °Psychological Services °Organization         Address  Phone  Notes  °[facility]  336- 832-9600   °[facility]  336- 378-7881   °[facility] [address], [place] 1-800-853-5163 or 336-641-4981   ° °Mobile Crisis Teams °Organization          Address  Phone  Notes  °[facility], Mobile Crisis Care Unit  1-877-626-1772   °Assertive °[facility] ° [address] [place], [place] 336-834-9664   °Sharon DeEsch [address] °[place] 336-554-5454   ° °Self-Help/Support Groups °Organization         Address  Phone             Notes  °[facility] [place] - variety of   support groups  336- 373-1402 Call for more information  °Narcotics Anonymous (NA), [facility] [address], °[place]  2 meetings at this location  ° °Residential Treatment Programs °Organization         Address  Phone  Notes  °[facility] [address],    °[place]  1-866-801-8205   °[facility] ° [address], [place], [place] 704-293-8524   °[facility] [address], [place] 336-845-3988 Admissions: 8am-3pm M-F  °[facility] [address]    °[place], [place] 336-841-1104   °[facility] [address], [place], [place] 336-379-7146   °[facility] [address]  °[place], [place] 336-285-9073   °[facility] Intensive Outpatient [address], [place], [place] 336-852-3033   °[facility] [address]  °[place], [place] 1-877-615-2722 or 336-784-9470   °[facility] [address] [place], [place] 336-227-7417 Accepts Medicaid  °[facility] [address]  ° [place] 1-800-659-3381 Substance Abuse/Addiction Treatment  ° °[place] Behavioral Health Resources °Organization         Address  Phone  Notes  °[facility]  (888) 581-9988   °Julie Brannon, PhD [address], [place]   (336) 349-5553 or (336) 951-0000   °[facility]   [address] °[place], [place] (336) 349-4454   °[facility] [address], [place], [place] (336) 342-8316 Insurance/Medicaid/sponsorship  through [facility]  °[facility] [address]                                    [place], [place] (336) 342-8316 Therapy/tele-psych/case  °[facility] [address]  ° [place], [place] (336) 349-2233    °Dr. Arfeen  (336) 349-4544   °[facility] [place]  [employer] [place] [facility] 1) [address], [place] °2) [address], [place] °3)  [address], [place] (336) 349-3220 °(336) 342-7768 ° °(336) 342-8140   °[place] [facility] (336) 342-1394 or (336) 342-3537 (After Hours)    ° °

## 2014-03-06 NOTE — ED Provider Notes (Signed)
CSN: [id number]     Arrival date & time 03/06/14  0905 History  This chart was scribed for non-physician practitioner, Clabe Seal, PA-C, working with Vida Roller, MD by Charline Bills, ED Scribe. This patient was seen in room TR06C/TR06C and the patient's care was started at 9:57 AM.   Chief Complaint  Patient presents with  . Medication Refill   HPI Comments: Henry Hardin is a 29 y.o. Male, with a h/o asthma, who presents to the Emergency Department for a medication refill of Albuterol. Pt reports mild wheezing while at work that has resolved. He denies cough, fever. Pt is a smoker. No PCP.  Unsure when his albuterol inhaler ran out.  The history is provided by the patient. No language interpreter was used.   Past Medical History  Diagnosis Date  . Asthma    History reviewed. No pertinent past surgical history. No family history on file. History  Substance Use Topics  . Smoking status: Current Every Day Smoker -- 1.00 packs/day    Types: Cigarettes  . Smokeless tobacco: Not on file  . Alcohol Use: Yes     Comment: occasional    Review of Systems  Constitutional: Negative for fever and chills.  Respiratory: Negative for cough, chest tightness, shortness of breath and wheezing.    Allergies  No known allergies  Home Medications   Prior to Admission medications   Medication Sig Start Date End Date Taking? Authorizing Provider  acetaminophen (TYLENOL) 500 MG tablet Take 1,000 mg by mouth every 6 (six) hours as needed for moderate pain.    Historical Provider, MD  albuterol (PROVENTIL HFA;VENTOLIN HFA) 108 (90 BASE) MCG/ACT inhaler Inhale 2 puffs into the lungs every 4 (four) hours as needed for wheezing or shortness of breath. 02/17/14   Hannah Muthersbaugh, PA-C  predniSONE (DELTASONE) 20 MG tablet 2 tabs po daily x 4 days 02/03/14   Tomasita Crumble, MD  pseudoephedrine (SUDAFED) 60 MG tablet Take 1 tablet (60 mg total) by mouth every 8 (eight) hours as needed for  congestion. 02/03/14   Tomasita Crumble, MD   Triage Vitals: BP 123/73 mmHg  Pulse 78  Temp(Src) 97.9 F (36.6 C) (Oral)  Resp 18  Ht  (1.88 m)  Wt 195 lb (88.451 kg)  BMI 25.03 kg/m2  SpO2 98% Physical Exam  Constitutional: He is oriented to person, place, and time. He appears well-developed and well-nourished. No distress.  HENT:  Head: Normocephalic and atraumatic.  Eyes: Conjunctivae and EOM are normal.  Neck: Neck supple.  Cardiovascular: Normal rate and regular rhythm.   Pulmonary/Chest: Effort normal and breath sounds normal. No accessory muscle usage. No tachypnea. No respiratory distress. He has no decreased breath sounds. He has no wheezes. He has no rhonchi. He has no rales. He exhibits no tenderness.  Patient is able to speak in complete sentences.   Musculoskeletal: Normal range of motion.  Neurological: He is alert and oriented to person, place, and time.  Skin: Skin is warm and dry.  Psychiatric: He has a normal mood and affect. His behavior is normal.  Nursing note and vitals reviewed.  ED Course  Procedures (including critical care time) DIAGNOSTIC STUDIES: Oxygen Saturation is 98% on RA, normal by my interpretation.    COORDINATION OF CARE: 10:00 AM-Discussed treatment plan which includes inhaler refill with pt at bedside and pt agreed to plan.   Labs Review Labs Reviewed - No data to display  Imaging Review No results found.  EKG Interpretation None      MDM   Final diagnoses:  Medication refill   Patient presents for medication refill. SPO2 98%, not tachycardic, in no acute distress. No wheezing or rhonchi on auscultation patient is speaking in complete sentences. Review of marsh shows multiple visits for similar complaints, last given inhaler 1/1. Patient was evaluated for times in November for similar complaints, receiving an albuterol inhaler at each visit. Multiple visits in December as well for similar complaints. Encouraged patient to  follow-up with PCP, stop smoking. Medication refill prescribed, no breathing treatment needed at this time.  I personally performed the services described in this documentation, which was scribed in my presence. The recorded information has been reviewed and is accurate.    Mellody DrownLauren Rochell Puett, PA-C 03/06/14 1021  Vida RollerBrian D Miller, MD 03/06/14 [phone]

## 2014-03-06 NOTE — ED Notes (Signed)
Patient states he just needs a medication refill.   Patient states he pulled out his inhaler today and it was empty.  Patient does have a primary doctor, but states can't afford prescriptions.

## 2014-06-09 ENCOUNTER — Emergency Department (HOSPITAL_COMMUNITY): Payer: Medicaid Other

## 2014-06-09 ENCOUNTER — Emergency Department (HOSPITAL_COMMUNITY)
Admission: EM | Admit: 2014-06-09 | Discharge: 2014-06-09 | Disposition: A | Discharge status: Home / Self Care | Payer: Medicaid Other | Attending: Emergency Medicine | Admitting: Emergency Medicine

## 2014-06-09 ENCOUNTER — Encounter (HOSPITAL_COMMUNITY): Payer: Self-pay | Admitting: Emergency Medicine

## 2014-06-09 DIAGNOSIS — R200 Anesthesia of skin: Secondary | ICD-10-CM | POA: Insufficient documentation

## 2014-06-09 DIAGNOSIS — R51 Headache: Secondary | ICD-10-CM | POA: Insufficient documentation

## 2014-06-09 DIAGNOSIS — J45909 Unspecified asthma, uncomplicated: Secondary | ICD-10-CM | POA: Diagnosis not present

## 2014-06-09 DIAGNOSIS — Z720 Tobacco use: Secondary | ICD-10-CM | POA: Diagnosis not present

## 2014-06-09 DIAGNOSIS — M791 Myalgia: Secondary | ICD-10-CM | POA: Diagnosis not present

## 2014-06-09 DIAGNOSIS — R519 Headache, unspecified: Secondary | ICD-10-CM

## 2014-06-09 DIAGNOSIS — R0789 Other chest pain: Secondary | ICD-10-CM | POA: Insufficient documentation

## 2014-06-09 DIAGNOSIS — R079 Chest pain, unspecified: Secondary | ICD-10-CM | POA: Diagnosis present

## 2014-06-09 LAB — I-STAT TROPONIN, ED: TROPONIN I, POC: 0 ng/mL (ref 0.00–0.08)

## 2014-06-09 LAB — CBC
HCT: 46.9 % (ref 39.0–52.0)
HEMOGLOBIN: 15.9 g/dL (ref 13.0–17.0)
MCH: 30.6 pg (ref 26.0–34.0)
MCHC: 33.9 g/dL (ref 30.0–36.0)
MCV: 90.4 fL (ref 78.0–100.0)
Platelets: 230 10*3/uL (ref 150–400)
RBC: 5.19 MIL/uL (ref 4.22–5.81)
RDW: 13 % (ref 11.5–15.5)
WBC: 4.5 10*3/uL (ref 4.0–10.5)

## 2014-06-09 LAB — BASIC METABOLIC PANEL
Anion gap: 4 — ABNORMAL LOW (ref 5–15)
BUN: 9 mg/dL (ref 6–23)
CALCIUM: 8.4 mg/dL (ref 8.4–10.5)
CO2: 28 mmol/L (ref 19–32)
CREATININE: 1.14 mg/dL (ref 0.50–1.35)
Chloride: 108 mmol/L (ref 96–112)
GFR calc Af Amer: >90 mL/min (ref >90)
GFR calc non Af Amer: 88 mL/min — ABNORMAL LOW (ref >90)
Glucose, Bld: 86 mg/dL (ref 70–99)
Potassium: 4 mmol/L (ref 3.5–5.1)
Sodium: 140 mmol/L (ref 135–145)

## 2014-06-09 MED ORDER — IPRATROPIUM-ALBUTEROL 0.5-2.5 (3) MG/3ML IN SOLN
3.0000 mL | Freq: Once | RESPIRATORY_TRACT | Status: DC
  Filled 2014-06-09: qty 3

## 2014-06-09 MED ORDER — DIPHENHYDRAMINE HCL 50 MG/ML IJ SOLN
25.0000 mg | Freq: Once | INTRAMUSCULAR | Status: AC
  Administered 2014-06-09: 25 mg via INTRAVENOUS
  Filled 2014-06-09: qty 1

## 2014-06-09 MED ORDER — SODIUM CHLORIDE 0.9 % IV BOLUS (SEPSIS)
1000.0000 mL | Freq: Once | INTRAVENOUS | Status: AC
  Administered 2014-06-09: 1000 mL via INTRAVENOUS

## 2014-06-09 MED ORDER — KETOROLAC TROMETHAMINE 30 MG/ML IJ SOLN
30.0000 mg | Freq: Once | INTRAMUSCULAR | Status: AC
  Administered 2014-06-09: 30 mg via INTRAVENOUS
  Filled 2014-06-09: qty 1

## 2014-06-09 MED ORDER — METOCLOPRAMIDE HCL 5 MG/ML IJ SOLN
10.0000 mg | Freq: Once | INTRAMUSCULAR | Status: AC
  Administered 2014-06-09: 10 mg via INTRAVENOUS
  Filled 2014-06-09: qty 2

## 2014-06-09 NOTE — ED Provider Notes (Signed)
CSN: [id number]     Arrival date & time 06/09/14  1304 History   First MD Initiated Contact with Patient 06/09/14 1457     Chief Complaint  Patient presents with  . Chest Pain  . Muscle Pain     (Consider location/radiation/quality/duration/timing/severity/associated sxs/prior Treatment) Patient is a 29 y.o. male presenting with chest pain and musculoskeletal pain. The history is provided by the patient.  Chest Pain Pain location:  L lateral chest and L chest Pain quality: tightness   Pain radiates to:  L arm Pain radiates to the back: no   Pain severity:  Moderate Onset quality:  Gradual Duration:  4 days Timing:  Constant Progression:  Unchanged Chronicity:  New Context: at rest   Context: no stress and no trauma   Relieved by:  Nothing Worsened by:  Nothing tried Associated symptoms: cough, headache (gradual onset over past 3-4 days) and numbness (L arm, chest pain is radiating into L arm)   Associated symptoms: no abdominal pain, no dizziness, no fatigue, no fever, no nausea and not vomiting   Muscle Pain Associated symptoms include chest pain and headaches (gradual onset over past 3-4 days). Pertinent negatives include no abdominal pain.    Past Medical History  Diagnosis Date  . Asthma    History reviewed. No pertinent past surgical history. History reviewed. No pertinent family history. History  Substance Use Topics  . Smoking status: Current Every Day Smoker -- 0.50 packs/day    Types: Cigarettes  . Smokeless tobacco: Not on file  . Alcohol Use: Yes     Comment: occasional    Review of Systems  Constitutional: Negative for fever and fatigue.  Respiratory: Positive for cough.   Cardiovascular: Positive for chest pain.  Gastrointestinal: Negative for nausea, vomiting and abdominal pain.  Neurological: Positive for numbness (L arm, chest pain is radiating into L arm) and headaches (gradual onset over past 3-4 days). Negative for dizziness.  All other systems  reviewed and are negative.     Allergies  No known allergies  Home Medications   Prior to Admission medications   Medication Sig Start Date End Date Taking? Authorizing Provider  albuterol (PROVENTIL HFA;VENTOLIN HFA) 108 (90 BASE) MCG/ACT inhaler Inhale 1-2 puffs into the lungs every 6 (six) hours as needed for wheezing or shortness of breath. 03/06/14  Yes Mellody DrownLauren Parker, PA-C  acetaminophen (TYLENOL) 500 MG tablet Take 1,000 mg by mouth every 6 (six) hours as needed for moderate pain.    Historical Provider, MD  predniSONE (DELTASONE) 20 MG tablet 2 tabs po daily x 4 days Patient not taking: Reported on 06/09/2014 02/03/14   Tomasita CrumbleAdeleke Oni, MD  pseudoephedrine (SUDAFED) 60 MG tablet Take 1 tablet (60 mg total) by mouth every 8 (eight) hours as needed for congestion. Patient not taking: Reported on 06/09/2014 02/03/14   Tomasita CrumbleAdeleke Oni, MD   BP 114/63 mmHg  Pulse 64  Temp(Src) 98.9 F (37.2 C) (Oral)  Resp 16  SpO2 98% Physical Exam  Constitutional: He is oriented to person, place, and time. He appears well-developed and well-nourished. No distress.  HENT:  Head: Normocephalic and atraumatic.  Mouth/Throat: Oropharynx is clear and moist. No oropharyngeal exudate.  Eyes: EOM are normal. Pupils are equal, round, and reactive to light.  Neck: Normal range of motion. Neck supple.  Cardiovascular: Normal rate and regular rhythm.  Exam reveals no friction rub.   No murmur heard. Pulmonary/Chest: Effort normal and breath sounds normal. No respiratory distress. He has no wheezes. He  has no rales. He exhibits no tenderness.  Abdominal: Soft. He exhibits no distension. There is no tenderness. There is no rebound.  Musculoskeletal: Normal range of motion. He exhibits no edema.  Neurological: He is alert and oriented to person, place, and time. No cranial nerve deficit or sensory deficit. Coordination normal. GCS eye subscore is 4. GCS verbal subscore is 5. GCS motor subscore is 6.  Skin: He is not  diaphoretic.  Nursing note and vitals reviewed.   ED Course  Procedures (including critical care time) Labs Review Labs Reviewed  CBC  BASIC METABOLIC PANEL    Imaging Review Dg Chest 2 View  06/09/2014   CLINICAL DATA:  Left-sided chest pain and tightness.  EXAM: CHEST - 2 VIEW  COMPARISON:  02/03/2014  FINDINGS: The heart size and mediastinal contours are within normal limits. There is no evidence of pulmonary edema, consolidation, pneumothorax, nodule or pleural fluid. The visualized skeletal structures are unremarkable.  IMPRESSION: No active disease.   Electronically Signed   By: Irish Lack M.D.   On: 06/09/2014 15:29     EKG Interpretation   Date/Time:  Friday June 09 2014 13:12:36 EDT Ventricular Rate:  69 PR Interval:  122 QRS Duration: 89 QT Interval:  374 QTC Calculation: 401 R Axis:   66 Text Interpretation:  Sinus rhythm RSR' in V1 or V2, probably normal  variant Minimal ST depression ST elev, probable normal early repol pattern  Baseline wander in lead(s) II aVR similar to prior EKGs Confirmed by BELFI   MD, MELANIE ([id number]) on 06/09/2014 1:15:31 PM      MDM   Final diagnoses:  Chest tightness  Acute nonintractable headache, unspecified headache type    29 year old male here with multiple complaints. He talks about chest tightness that has been going on for the past 3-4 days. He also has associated cough. Cough has been present for years. He's had vomiting for 2 months is worsened in the past weeks. He reports a headache that has gotten bad over the past few days. Gradual in onset. No blurry vision. No fevers, diarrhea, URI symptoms. He's a smoker. Vitals are stable here. No chest tenderness or muscle spasm appreciated. Lungs with some mildly decreased air movement but no wheezing. Belly is benign. Unclear etiology of patient's vague symptoms. Does not seem like URI or flu. We'll plan on basic labs, chest x-ray. Will give fluids, headache  cocktail.  Feeling much better after meds. Labs ok. Stable for discharge.  Elwin Mocha, MD 06/09/14 [phone]

## 2014-06-09 NOTE — Progress Notes (Signed)
pcp is  [facility] [address] [place] [id number] [phone]

## 2014-06-09 NOTE — Discharge Instructions (Signed)

## 2014-06-09 NOTE — ED Notes (Signed)
Pt c/o central, left chest pain/tightness starting "monday or Tuesday." When asked if pain radiated anywhere pt states, "my whole upper torso is tight and the pain goes to my throat (pointing to right neck) when my throat gets sore." Denies recent injury/trauma. Emesis occurences reported "a couple of days ago." Denies SOB/dizziness but says, "they say I have asthma." Has attempted Tylenol for pain-took last dose yesterday without alleviation of symptoms. RR even/unlabored. Moving all extremities. No other c/c.

## 2014-06-09 NOTE — ED Notes (Signed)
Bed: WA10 Expected date:  Expected time:  Means of arrival:  Comments: Triage 1   

## 2014-07-12 ENCOUNTER — Emergency Department (HOSPITAL_COMMUNITY)
Admission: EM | Admit: 2014-07-12 | Discharge: 2014-07-12 | Disposition: A | Discharge status: Home / Self Care | Payer: Medicaid Other | Attending: Emergency Medicine | Admitting: Emergency Medicine

## 2014-07-12 ENCOUNTER — Encounter (HOSPITAL_COMMUNITY): Payer: Self-pay | Admitting: Emergency Medicine

## 2014-07-12 DIAGNOSIS — Y92410 Unspecified street and highway as the place of occurrence of the external cause: Secondary | ICD-10-CM | POA: Insufficient documentation

## 2014-07-12 DIAGNOSIS — Z79899 Other long term (current) drug therapy: Secondary | ICD-10-CM | POA: Insufficient documentation

## 2014-07-12 DIAGNOSIS — Y9389 Activity, other specified: Secondary | ICD-10-CM | POA: Insufficient documentation

## 2014-07-12 DIAGNOSIS — Y998 Other external cause status: Secondary | ICD-10-CM | POA: Diagnosis not present

## 2014-07-12 DIAGNOSIS — S199XXA Unspecified injury of neck, initial encounter: Secondary | ICD-10-CM | POA: Diagnosis present

## 2014-07-12 DIAGNOSIS — J45901 Unspecified asthma with (acute) exacerbation: Secondary | ICD-10-CM | POA: Diagnosis not present

## 2014-07-12 DIAGNOSIS — M542 Cervicalgia: Secondary | ICD-10-CM

## 2014-07-12 DIAGNOSIS — S0990XA Unspecified injury of head, initial encounter: Secondary | ICD-10-CM | POA: Diagnosis not present

## 2014-07-12 DIAGNOSIS — Z720 Tobacco use: Secondary | ICD-10-CM | POA: Insufficient documentation

## 2014-07-12 DIAGNOSIS — J45909 Unspecified asthma, uncomplicated: Secondary | ICD-10-CM

## 2014-07-12 MED ORDER — ALBUTEROL SULFATE HFA 108 (90 BASE) MCG/ACT IN AERS: 1.0000 | INHALATION_SPRAY | Freq: Four times a day (QID) | RESPIRATORY_TRACT | Status: DC | PRN
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
## 2014-07-12 NOTE — ED Notes (Signed)
Pt ambulating in hall without issue.  Pt sts "should have just stayed home and taken ibuprofen".  Pt asking when he will be seen, sts his ride is waiting.  Again saying "should have just stayed home, it's not that bad anyway".  EDPA aware.

## 2014-07-24 ENCOUNTER — Emergency Department (HOSPITAL_COMMUNITY)
Admission: EM | Admit: 2014-07-24 | Discharge: 2014-07-24 | Disposition: A | Discharge status: Home / Self Care | Payer: Medicaid Other | Attending: Emergency Medicine | Admitting: Emergency Medicine

## 2014-07-24 ENCOUNTER — Encounter (HOSPITAL_COMMUNITY): Payer: Self-pay | Admitting: Emergency Medicine

## 2014-07-24 DIAGNOSIS — Z79899 Other long term (current) drug therapy: Secondary | ICD-10-CM | POA: Diagnosis not present

## 2014-07-24 DIAGNOSIS — J45909 Unspecified asthma, uncomplicated: Secondary | ICD-10-CM | POA: Insufficient documentation

## 2014-07-24 DIAGNOSIS — Z7952 Long term (current) use of systemic steroids: Secondary | ICD-10-CM | POA: Diagnosis not present

## 2014-07-24 DIAGNOSIS — Z720 Tobacco use: Secondary | ICD-10-CM | POA: Insufficient documentation

## 2014-07-24 DIAGNOSIS — M542 Cervicalgia: Secondary | ICD-10-CM | POA: Diagnosis not present

## 2014-07-24 DIAGNOSIS — G8911 Acute pain due to trauma: Secondary | ICD-10-CM | POA: Insufficient documentation

## 2014-07-24 MED ORDER — METHOCARBAMOL 500 MG PO TABS: 500.0000 mg | ORAL_TABLET | Freq: Two times a day (BID) | ORAL | Status: DC

## 2014-07-24 MED ORDER — OXYCODONE-ACETAMINOPHEN 5-325 MG PO TABS: 2.0000 | ORAL_TABLET | ORAL | Status: DC | PRN

## 2014-07-24 MED ORDER — NAPROXEN 500 MG PO TABS: 500.0000 mg | ORAL_TABLET | Freq: Two times a day (BID) | ORAL | Status: DC

## 2014-07-24 NOTE — Discharge Instructions (Signed)
Cervical Sprain Take medication as prescribed. A cervical sprain is an injury in the neck in which the strong, fibrous tissues (ligaments) that connect your neck bones stretch or tear. Cervical sprains can range from mild to severe. Severe cervical sprains can cause the neck vertebrae to be unstable. This can lead to damage of the spinal cord and can result in serious nervous system problems. The amount of time it takes for a cervical sprain to get better depends on the cause and extent of the injury. Most cervical sprains heal in 1 to 3 weeks. CAUSES  Severe cervical sprains may be caused by:   Contact sport injuries (such as from football, rugby, wrestling, hockey, auto racing, gymnastics, diving, martial arts, or boxing).   Motor vehicle collisions.   Whiplash injuries. This is an injury from a sudden forward and backward whipping movement of the head and neck.  Falls.  Mild cervical sprains may be caused by:   Being in an awkward position, such as while cradling a telephone between your ear and shoulder.   Sitting in a chair that does not offer proper support.   Working at a poorly Marketing executive station.   Looking up or down for long periods of time.  SYMPTOMS   Pain, soreness, stiffness, or a burning sensation in the front, back, or sides of the neck. This discomfort may develop immediately after the injury or slowly, 24 hours or more after the injury.   Pain or tenderness directly in the middle of the back of the neck.   Shoulder or upper back pain.   Limited ability to move the neck.   Headache.   Dizziness.   Weakness, numbness, or tingling in the hands or arms.   Muscle spasms.   Difficulty swallowing or chewing.   Tenderness and swelling of the neck.  DIAGNOSIS  Most of the time your health care provider can diagnose a cervical sprain by taking your history and doing a physical exam. Your health care provider will ask about previous neck  injuries and any known neck problems, such as arthritis in the neck. X-rays may be taken to find out if there are any other problems, such as with the bones of the neck. Other tests, such as a CT scan or MRI, may also be needed.  TREATMENT  Treatment depends on the severity of the cervical sprain. Mild sprains can be treated with rest, keeping the neck in place (immobilization), and pain medicines. Severe cervical sprains are immediately immobilized. Further treatment is done to help with pain, muscle spasms, and other symptoms and may include:  Medicines, such as pain relievers, numbing medicines, or muscle relaxants.   Physical therapy. This may involve stretching exercises, strengthening exercises, and posture training. Exercises and improved posture can help stabilize the neck, strengthen muscles, and help stop symptoms from returning.  HOME CARE INSTRUCTIONS   Put ice on the injured area.   Put ice in a plastic bag.   Place a towel between your skin and the bag.   Leave the ice on for 15-20 minutes, 3-4 times a day.   If your injury was severe, you may have been given a cervical collar to wear. A cervical collar is a two-piece collar designed to keep your neck from moving while it heals.  Do not remove the collar unless instructed by your health care provider.  If you have long hair, keep it outside of the collar.  Ask your health care provider before making any adjustments  to your collar. Minor adjustments may be required over time to improve comfort and reduce pressure on your chin or on the back of your head.  Ifyou are allowed to remove the collar for cleaning or bathing, follow your health care provider's instructions on how to do so safely.  Keep your collar clean by wiping it with mild soap and water and drying it completely. If the collar you have been given includes removable pads, remove them every 1-2 days and hand wash them with soap and water. Allow them to air  dry. They should be completely dry before you wear them in the collar.  If you are allowed to remove the collar for cleaning and bathing, wash and dry the skin of your neck. Check your skin for irritation or sores. If you see any, tell your health care provider.  Do not drive while wearing the collar.   Only take over-the-counter or prescription medicines for pain, discomfort, or fever as directed by your health care provider.   Keep all follow-up appointments as directed by your health care provider.   Keep all physical therapy appointments as directed by your health care provider.   Make any needed adjustments to your workstation to promote good posture.   Avoid positions and activities that make your symptoms worse.   Warm up and stretch before being active to help prevent problems.  SEEK MEDICAL CARE IF:   Your pain is not controlled with medicine.   You are unable to decrease your pain medicine over time as planned.   Your activity level is not improving as expected.  SEEK IMMEDIATE MEDICAL CARE IF:   You develop any bleeding.  You develop stomach upset.  You have signs of an allergic reaction to your medicine.   Your symptoms get worse.   You develop new, unexplained symptoms.   You have numbness, tingling, weakness, or paralysis in any part of your body.  MAKE SURE YOU:   Understand these instructions.  Will watch your condition.  Will get help right away if you are not doing well or get worse. Document Released: 12/01/2006 Document Revised: 02/08/2013 Document Reviewed: 08/11/2012 [facility] Patient Information 2015 [place], [place]. This information is not intended to replace advice given to you by your health care provider. Make sure you discuss any questions you have with your health care provider.  Emergency Department Resource Guide 1) Find a Doctor and Pay Out of Pocket Although you won't have to find out who is covered by your insurance plan,  it is a good idea to ask around and get recommendations. You will then need to call the office and see if the doctor you have chosen will accept you as a new patient and what types of options they offer for patients who are self-pay. Some doctors offer discounts or will set up payment plans for their patients who do not have insurance, but you will need to ask so you aren't surprised when you get to your appointment.  2) Contact Your Local Health Department Not all health departments have doctors that can see patients for sick visits, but many do, so it is worth a call to see if yours does. If you don't know where your local health department is, you can check in your phone book. The CDC also has a tool to help you locate your state's health department, and many state websites also have listings of all of their local health departments.  3) Find a [employer] If your  illness is not likely to be very severe or complicated, you may want to try a walk in clinic. These are popping up all over the country in pharmacies, drugstores, and shopping centers. They're usually staffed by nurse practitioners or physician assistants that have been trained to treat common illnesses and complaints. They're usually fairly quick and inexpensive. However, if you have serious medical issues or chronic medical problems, these are probably not your best option.  No Primary Care Doctor: - Call Health Connect at  [phone] - they can help you locate a primary care doctor that  accepts your insurance, provides certain services, etc. - Physician Referral Service- [phone]  Chronic Pain Problems: Organization         Address  Phone   Notes  [facility]  [phone] Patients need to be referred by their primary care doctor.   Medication Assistance: Organization         Address  Phone   Notes  [facility] Medication [facility] [address] [place], [place] [address] [phone] --Must be a resident of [facility] -- Must have NO insurance coverage whatsoever (no Medicaid/ Medicare, etc.) -- The pt. MUST have a primary care doctor that directs their care regularly and follows them in the community   MedAssist  [phone]   [employer]  [phone]    Agencies that provide inexpensive medical care: Organization         Address  Phone   Notes  [facility]  [phone]   [facility]    [phone]   [facility] [address] [place], [place] [address] [phone]   [facility] [address] [place] [place] [phone]   Planned Parenthood    [phone]   [facility]    [phone]   [facility]  [address], [place] Phone:  [phone], Fax:  [phone] Hours of Operation:  9 am - 6 pm, M-F.  Also accepts Medicaid/Medicare and self-pay.  [facility]  [address], [place] Phone: [phone], Fax: [phone]. Hours of Operation:  8:30 am - 5:30 pm, M-F.  Also accepts Medicaid and self-pay.  [facility] [place] [address] [place] Phone: [phone]   [facility] [address] Natasha Bence [place], [place] [phone], Ext. [phone] Mondays & Thursdays: 7-9 AM.  First 15 patients are seen on a first come, first serve basis.    Medicaid-accepting [facility] Providers:  Organization         Address  Phone   Notes  [facility] [address], [place] [phone] Also accepts self-pay patients.  [facility] [address] Laurell Josephs [place], [place]  [phone]   [facility] [address], [place] [phone]   [facility] [address] [place] [phone]   Renaye Rakers [address], [place]   [phone] Only accepts [place] Access [place] patients after they have their name applied to their card.   Self-Pay (no insurance) in [facility]:  Organization         Address  Phone   Notes  Sickle Cell Patients, [facility] [address] [place], [place] [phone]  [facility] [address], [place] [phone]) [phone]   [facility] [place]  [address], [place] [phone]   [facility]/Dr. Osei-Bonsu  [address] [place] [address], [place] or [phone], [place] [phone] Phone number for both [place] and [place] locations is the same.  [facility] [address], [place] [phone]   [facility] [place] [address] [place] [address], [place] or [address] [phone]   [facility] [address], [place] [phone], phone; [phone], fax Sees patients 1st and 3rd Saturday of every month.  Must not qualify for public or private insurance (i.e. Medicaid, Medicare, [place] Health Choice, Veterans' Benefits)  Household income should be no more than 200% of the poverty level The clinic cannot treat you if you are pregnant or think you are pregnant  Sexually transmitted diseases are not treated at the clinic.    Dental Care: Organization         Address  Phone  Notes  [facility] [address], [place] [phone] Accepts children up to age 27 who are enrolled in [place] or [place] Health Choice; pregnant women with a Medicaid card; and children who have applied for Medicaid or [place] Health Choice, but were declined, whose parents can pay a reduced fee at time of service.  [facility] [place] [facility] [place]  [address], [place] [phone] Accepts children up to age 27 who are enrolled in [place] or [place]  Choice; pregnant women with a Medicaid card; and children who have applied for Medicaid or [place] Health Choice, but were declined, whose parents can pay a reduced fee at time of service.  [facility]  [address], [place] [phone] Patients are seen by appointment only. Walk-ins are not accepted. [facility] will see patients 27 years of age and older. Monday - Tuesday (8am-5pm) Most Wednesdays (8:30-5pm) $30 per visit, cash only  [facility]  [address], [facility] [place] [phone] Patients are seen by appointment only. Walk-ins are not accepted. [facility] will see patients 27 years of age and older. One Wednesday Evening (Monthly: Volunteer Based).  $30 per visit, cash only  [employer] [facility]  [phone] for adults; Children under age 534, call Graduate Pediatric Dentistry at [phone]. Children aged 904-14, please call [phone]) [phone] to request a pediatric application.  Dental services are provided in all areas of dental care including fillings, crowns and bridges, complete and partial dentures, implants, gum treatment, root canals, and extractions. Preventive care is also provided. Treatment is provided to both adults and children. Patients are selected via a lottery and there is often a waiting list.   [facility] [address], [place]  [phone] [email or web address]   [facility] [address], [address] [place] [place], [place] [phone], Ext. [phone] Second and Fourth Thursday of each month, opens at 6:30 AM; Clinic ends at 9 AM.  Patients are seen on a first-come first-served basis, and a limited number are seen during each clinic.   [facility]  [address], [place], [place] [phone]   Eligibility Requirements You must have lived in [place], [place], or [place] for at least the last  three months.   You cannot be eligible for state or  federal sponsored [place], including [employer], [place], or [employer].   You generally cannot be eligible for healthcare insurance through your employer.    How to apply: Eligibility screenings are held every Tuesday and Wednesday afternoon from 1:00 pm until 4:00 pm. You do not need an appointment for the interview!  [facility] [address], [place], [place] [phone]   [facility]  [phone]   [facility] Health Department  [phone]   [facility] Health Department  [phone]    Behavioral Health Resources in the Community: Intensive Outpatient Programs Organization         Address  Phone  Notes  [facility] [address], [place], [place] [phone]   [facility] Outpatient [address] [place], [place] [phone]   [facility] [address], [place], [place]  [phone]   [facility] Mental Health [address]  [place], [place] [phone] or [phone]   Substance Abuse Resources Organization         Address  Phone  Notes  [facility] Drug Services  [phone]   [facility]  [phone]   [facility]  [phone]   Floydene Flock  [phone]   Residential & Outpatient Substance Abuse Program  [phone]   Psychological Services Organization         Address  Phone  Notes  [facility] Behavioral Health  [phone]   [facility]  [phone]   [facility] Mental Health [address] [place] [phone] or [phone]    Mobile Crisis Teams Organization         Address  Phone  Notes  [facility], Mobile Crisis Care Unit  [phone]   [facility]  [address] [place], [place] [phone]   Doristine Locks [address] [place] [phone]    Self-Help/Support Groups Organization         Address  Phone              Notes  [facility] [place] - variety of support groups  [phone]- I7437963 Call for more information  Narcotics Anonymous (NA), [facility] [address], [employer] [address] [place]  2 meetings at this location   Statistician         Address  Phone  Notes  [facility] [address],    [place]  [phone]   [facility]  [address], [place] [address], [place], [place] [phone]   [facility] [address] [place], [place] [phone] Admissions: 8am-3pm M-F  [facility] [address],    [place], [place] [phone]   [facility] [address] [place], [place], [place] [phone]   [facility] [address]  [place], [place] [phone]   [facility] - Intensive Outpatient [address] Laurell Josephs [address], [place], [place] [phone]   [facility] ([facility]iction [facility] [address] [place]  [place], [place] [phone] or [phone]   [facility] ([facility]) [address], [place], [place] [phone] Accepts Medicaid  [facility] [address],  [place] [phone] Substance Abuse/Addiction Treatment   [facility] Organization         Address  Phone  Notes  [facility]  [phone]   Raynelle Fanning  Alvan Dame, PhD [address] Ervin Knack [place], [place]   [phone] or [phone]   [facility]   [address] [place], [place] [phone]   [facility] [address], [place], [place] [phone] Insurance/Medicaid/sponsorship through [facility] [address]                                    [place], [place] [phone] Therapy/tele-psych/case  [facility] [address], [place] [phone]    Dr. Lolly Mustache  [phone]   [facility]  [employer] [facility]  Dept. 1) [address] [place] 2) [address] [place] 3)  [address], [place] [phone]  [phone]   [facility] Child Abuse Hotline [phone] or 9075519377 (After Hours)
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
## 2014-08-01 ENCOUNTER — Emergency Department (HOSPITAL_COMMUNITY)
Admission: EM | Admit: 2014-08-01 | Discharge: 2014-08-01 | Disposition: A | Discharge status: Home / Self Care | Payer: Medicaid Other | Attending: Emergency Medicine | Admitting: Emergency Medicine

## 2014-08-01 ENCOUNTER — Encounter (HOSPITAL_COMMUNITY): Payer: Self-pay | Admitting: Emergency Medicine

## 2014-08-01 ENCOUNTER — Emergency Department (HOSPITAL_COMMUNITY): Payer: Medicaid Other

## 2014-08-01 DIAGNOSIS — G8911 Acute pain due to trauma: Secondary | ICD-10-CM | POA: Diagnosis not present

## 2014-08-01 DIAGNOSIS — Z791 Long term (current) use of non-steroidal anti-inflammatories (NSAID): Secondary | ICD-10-CM | POA: Insufficient documentation

## 2014-08-01 DIAGNOSIS — M546 Pain in thoracic spine: Secondary | ICD-10-CM | POA: Insufficient documentation

## 2014-08-01 DIAGNOSIS — J45909 Unspecified asthma, uncomplicated: Secondary | ICD-10-CM | POA: Insufficient documentation

## 2014-08-01 DIAGNOSIS — Z79899 Other long term (current) drug therapy: Secondary | ICD-10-CM | POA: Insufficient documentation

## 2014-08-01 DIAGNOSIS — Z7952 Long term (current) use of systemic steroids: Secondary | ICD-10-CM | POA: Insufficient documentation

## 2014-08-01 DIAGNOSIS — Z720 Tobacco use: Secondary | ICD-10-CM | POA: Diagnosis not present

## 2014-08-01 DIAGNOSIS — M549 Dorsalgia, unspecified: Secondary | ICD-10-CM

## 2014-08-01 MED ORDER — KETOROLAC TROMETHAMINE 30 MG/ML IJ SOLN
30.0000 mg | Freq: Once | INTRAMUSCULAR | Status: AC
  Administered 2014-08-01: 30 mg via INTRAMUSCULAR
  Filled 2014-08-01: qty 1

## 2014-08-01 MED ORDER — OXYCODONE-ACETAMINOPHEN 5-325 MG PO TABS: 2.0000 | ORAL_TABLET | ORAL | Status: DC | PRN
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
## 2014-09-23 ENCOUNTER — Encounter (HOSPITAL_COMMUNITY): Payer: Self-pay | Admitting: *Deleted

## 2014-09-23 ENCOUNTER — Emergency Department (HOSPITAL_COMMUNITY)
Admission: EM | Admit: 2014-09-23 | Discharge: 2014-09-23 | Disposition: A | Discharge status: Home / Self Care | Payer: Medicaid Other | Attending: Emergency Medicine | Admitting: Emergency Medicine

## 2014-09-23 DIAGNOSIS — J45909 Unspecified asthma, uncomplicated: Secondary | ICD-10-CM | POA: Insufficient documentation

## 2014-09-23 DIAGNOSIS — Z720 Tobacco use: Secondary | ICD-10-CM | POA: Insufficient documentation

## 2014-09-23 DIAGNOSIS — M25562 Pain in left knee: Secondary | ICD-10-CM | POA: Diagnosis not present

## 2014-09-23 DIAGNOSIS — Z760 Encounter for issue of repeat prescription: Secondary | ICD-10-CM | POA: Insufficient documentation

## 2014-09-23 DIAGNOSIS — Z79899 Other long term (current) drug therapy: Secondary | ICD-10-CM | POA: Insufficient documentation

## 2014-09-23 MED ORDER — ALBUTEROL SULFATE HFA 108 (90 BASE) MCG/ACT IN AERS
2.0000 | INHALATION_SPRAY | Freq: Four times a day (QID) | RESPIRATORY_TRACT | Status: DC
  Administered 2014-09-23: 2 via RESPIRATORY_TRACT
  Filled 2014-09-23: qty 6.7

## 2014-09-23 NOTE — ED Notes (Signed)
Patient presents stating his left knee is swollen (old ACL injury) and has run out of his inhaler (Albuterol)

## 2014-09-23 NOTE — ED Notes (Signed)
Pt states that he was playing basketball yesterday and he injured his right knee, and pt states that he is out of his albuterol inhaler

## 2014-09-23 NOTE — Discharge Instructions (Signed)

## 2014-09-23 NOTE — ED Provider Notes (Signed)
CSN: [id number]     Arrival date & time 09/23/14  2106 History  This chart was scribed for Catha Gosselin, PA-C, working with Elwin Mocha, MD by Elon Spanner, ED Scribe. This patient was seen in room TR07C/TR07C and the patient's care was started at 9:43 PM.   Chief Complaint  Patient presents with  . Knee Pain  . Medication Refill   The history is provided by the patient. No language interpreter was used.   HPI Comments: Henry Hardin is a 29 y.o. male who presents to the Emergency Department complaining of moderate, unchanged left knee pain onset yesterday after planting firmly yesterday while playing basketball.  He reports associated swelling and describes the pain as sharp.  He has taken ibuprofen without relief.  Patient is ambulatory.  He has a history of hyperextension injury to the knee 3 years ago.  Patient also reports a history of asthma, worse at night, and requests a refill of his albuterol inhaler. He denies any fever, shortness of breath, trouble breathing, drooling.    Past Medical History  Diagnosis Date  . Asthma    History reviewed. No pertinent past surgical history. No family history on file. History  Substance Use Topics  . Smoking status: Current Every Day Smoker -- 0.50 packs/day    Types: Cigarettes  . Smokeless tobacco: Never Used  . Alcohol Use: Yes     Comment: occasional    Review of Systems  Constitutional: Negative for fever.  Musculoskeletal: Positive for joint swelling and arthralgias.      Allergies  Review of patient's allergies indicates no known allergies.  Home Medications   Prior to Admission medications   Medication Sig Start Date End Date Taking? Authorizing Provider  albuterol (PROVENTIL HFA;VENTOLIN HFA) 108 (90 BASE) MCG/ACT inhaler Inhale 1-2 puffs into the lungs every 6 (six) hours as needed for wheezing or shortness of breath. 07/12/14   Everlene Farrier, PA-C  methocarbamol (ROBAXIN) 500 MG tablet Take 1 tablet (500 mg  total) by mouth 2 (two) times daily. 07/24/14   Cambelle Suchecki Patel-Mills, PA-C  naproxen (NAPROSYN) 500 MG tablet Take 1 tablet (500 mg total) by mouth 2 (two) times daily. Patient not taking: Reported on 08/01/2014 07/24/14   Catha Gosselin, PA-C  oxyCODONE-acetaminophen (PERCOCET/ROXICET) 5-325 MG per tablet Take 2 tablets by mouth every 4 (four) hours as needed for severe pain. 08/01/14   Kissie Ziolkowski Patel-Mills, PA-C  predniSONE (DELTASONE) 20 MG tablet 2 tabs po daily x 4 days Patient not taking: Reported on 06/09/2014 02/03/14   Tomasita Crumble, MD  pseudoephedrine (SUDAFED) 60 MG tablet Take 1 tablet (60 mg total) by mouth every 8 (eight) hours as needed for congestion. Patient not taking: Reported on 06/09/2014 02/03/14   Tomasita Crumble, MD   BP 127/83 mmHg  Pulse 56  Temp(Src) 98.3 F (36.8 C) (Oral)  Resp 16  Ht 6\' 2"  (1.88 m)  Wt 175 lb (79.379 kg)  BMI 22.46 kg/m2  SpO2 98% Physical Exam  Constitutional: He is oriented to person, place, and time. He appears well-developed and well-nourished. No distress.  HENT:  Head: Normocephalic and atraumatic.  Eyes: Conjunctivae and EOM are normal.  Neck: Neck supple. No tracheal deviation present.  Cardiovascular: Normal rate.   Pulmonary/Chest: Effort normal. No respiratory distress.  Musculoskeletal: Normal range of motion.  Left knee: Tenderness to palpation of the medial knee. Ambulatory with steady gait. No patellar tenderness or fibular head tenderness. No knee effusion, rash, or erythema. No calf tenderness. No popliteal  fossa tenderness.  Neurological: He is alert and oriented to person, place, and time.  Skin: Skin is warm and dry.  Psychiatric: He has a normal mood and affect. His behavior is normal.  Nursing note and vitals reviewed.   ED Course  Procedures (including critical care time)  DIAGNOSTIC STUDIES: Oxygen Saturation is 98% on RA, normal by my interpretation.    COORDINATION OF CARE:  9:48 PM Discussed treatment plan with  patient at bedside.  Patient acknowledges and agrees with plan.    Labs Review Labs Reviewed - No data to display  Imaging Review No results found.   EKG Interpretation None      MDM   Final diagnoses:  Left knee pain  Medication refill   Patient presents for left knee pain and requesting albuterol inhaler for asthma. I reviewed the [place] knee rules. His knee exam is not concerning. I discussed RICE and taking naproxen for pain. I also gave him an albuterol inhaler. Patient verbally agrees with the plan. I personally performed the services described in this documentation, which was scribed in my presence. The recorded information has been reviewed and is accurate.    Catha Gosselin, PA-C 09/23/14 2156  Elwin Mocha, MD 09/23/14 [phone]

## 2014-10-05 ENCOUNTER — Emergency Department (HOSPITAL_COMMUNITY)
Admission: EM | Admit: 2014-10-05 | Discharge: 2014-10-05 | Disposition: A | Discharge status: Home / Self Care | Payer: Medicaid Other | Attending: Emergency Medicine | Admitting: Emergency Medicine

## 2014-10-05 ENCOUNTER — Encounter (HOSPITAL_COMMUNITY): Payer: Self-pay | Admitting: Emergency Medicine

## 2014-10-05 DIAGNOSIS — Z79899 Other long term (current) drug therapy: Secondary | ICD-10-CM | POA: Insufficient documentation

## 2014-10-05 DIAGNOSIS — J4520 Mild intermittent asthma, uncomplicated: Secondary | ICD-10-CM

## 2014-10-05 DIAGNOSIS — Z720 Tobacco use: Secondary | ICD-10-CM | POA: Diagnosis not present

## 2014-10-05 DIAGNOSIS — J45909 Unspecified asthma, uncomplicated: Secondary | ICD-10-CM | POA: Diagnosis present

## 2014-10-05 MED ORDER — ALBUTEROL SULFATE HFA 108 (90 BASE) MCG/ACT IN AERS
2.0000 | INHALATION_SPRAY | RESPIRATORY_TRACT | Status: DC | PRN
  Administered 2014-10-05: 2 via RESPIRATORY_TRACT
  Filled 2014-10-05: qty 6.7

## 2014-10-05 NOTE — ED Provider Notes (Signed)
CSN: [id number]     Arrival date & time 10/05/14  2218 History  This chart was scribed for non-physician practitioner, Arman Filter, NP, working with Tomasita Crumble, MD, by Budd Palmer ED Scribe. This patient was seen in room WTR7/WTR7 and the patient's care was started at 11:16 PM     Chief Complaint  Patient presents with  . Asthma   The history is provided by the patient. No language interpreter was used.   HPI Comments: Henry Hardin is a 29 y.o. male who presents to the Emergency Department complaining of an asthma attack that occurred 10 hours ago. He has come to the ED to obtain a new inhaler. He states he is out of refills for his inhaler, and that his medicaid ran out.  Past Medical History  Diagnosis Date  . Asthma    History reviewed. No pertinent past surgical history. History reviewed. No pertinent family history. Social History  Substance Use Topics  . Smoking status: Current Every Day Smoker -- 0.50 packs/day    Types: Cigarettes  . Smokeless tobacco: Never Used  . Alcohol Use: Yes     Comment: occasional    Review of Systems  HENT: Negative for rhinorrhea.   Respiratory: Negative for cough, shortness of breath and wheezing.     Allergies  Review of patient's allergies indicates no known allergies.  Home Medications   Prior to Admission medications   Medication Sig Start Date End Date Taking? Authorizing Provider  albuterol (PROVENTIL HFA;VENTOLIN HFA) 108 (90 BASE) MCG/ACT inhaler Inhale 1-2 puffs into the lungs every 6 (six) hours as needed for wheezing or shortness of breath. 07/12/14   Everlene Farrier, PA-C  methocarbamol (ROBAXIN) 500 MG tablet Take 1 tablet (500 mg total) by mouth 2 (two) times daily. 07/24/14   Hanna Patel-Mills, PA-C  naproxen (NAPROSYN) 500 MG tablet Take 1 tablet (500 mg total) by mouth 2 (two) times daily. Patient not taking: Reported on 08/01/2014 07/24/14   Catha Gosselin, PA-C  oxyCODONE-acetaminophen (PERCOCET/ROXICET) 5-325 MG  per tablet Take 2 tablets by mouth every 4 (four) hours as needed for severe pain. 08/01/14   Hanna Patel-Mills, PA-C  predniSONE (DELTASONE) 20 MG tablet 2 tabs po daily x 4 days Patient not taking: Reported on 06/09/2014 02/03/14   Tomasita Crumble, MD  pseudoephedrine (SUDAFED) 60 MG tablet Take 1 tablet (60 mg total) by mouth every 8 (eight) hours as needed for congestion. Patient not taking: Reported on 06/09/2014 02/03/14   Tomasita Crumble, MD   BP 112/75 mmHg  Pulse 72  Temp(Src) 98.4 F (36.9 C) (Oral)  Resp 18  SpO2 97% Physical Exam  Constitutional: He is oriented to person, place, and time. He appears well-developed and well-nourished.  HENT:  Head: Normocephalic.  Right Ear: External ear normal.  Left Ear: External ear normal.  Eyes: Pupils are equal, round, and reactive to light.  Neck: Normal range of motion.  Cardiovascular: Normal rate.   Pulmonary/Chest: Breath sounds normal. He is in respiratory distress. He has no wheezes.  Neurological: He is alert and oriented to person, place, and time.  Skin: Skin is warm.  Nursing note and vitals reviewed.   ED Course  Procedures  DIAGNOSTIC STUDIES: Oxygen Saturation is 97% on RA, adequate by my interpretation.    COORDINATION OF CARE: 11:17 PM - Discussed plans to discharge. Pt advised of plan for treatment and pt agrees.  Labs Review Labs Reviewed - No data to display  Imaging Review No results found.  I have personally reviewed and evaluated these images and lab results as part of my medical decision-making.   EKG Interpretation None    patient states he used to go to [facility], but hasn't been in a while any lead is Medicaid drop so has no insurance and is requesting a "free inhaler"  MDM   Final diagnoses:  Asthma in adult, mild intermittent, uncomplicated     Earley Favor, NP 10/05/14 1610  Tomasita Crumble, MD 10/06/14 [phone]

## 2014-10-05 NOTE — ED Notes (Signed)
Pt arrived to the ED with a complaint of an asthma attack around 1300 hrs today.  Pt didn't have a rescue inhaler so he is here to get one prior to going to sleep.

## 2014-10-05 NOTE — Discharge Instructions (Signed)
Asthma °Asthma is a recurring condition in which the airways tighten and narrow. Asthma can make it difficult to breathe. It can cause coughing, wheezing, and shortness of breath. Asthma episodes, also called asthma attacks, range from minor to life-threatening. Asthma cannot be cured, but medicines and lifestyle changes can help control it. °CAUSES °Asthma is believed to be caused by inherited (genetic) and environmental factors, but its exact cause is unknown. Asthma may be triggered by allergens, lung infections, or irritants in the air. Asthma triggers are different for each person. Common triggers include:  °· Animal dander. °· Dust mites. °· Cockroaches. °· Pollen from trees or grass. °· Mold. °· Smoke. °· Air pollutants such as dust, household cleaners, hair sprays, aerosol sprays, paint fumes, strong chemicals, or strong odors. °· Cold air, weather changes, and winds (which increase molds and pollens in the air). °· Strong emotional expressions such as crying or laughing hard. °· Stress. °· Certain medicines (such as aspirin) or types of drugs (such as beta-blockers). °· Sulfites in foods and drinks. Foods and drinks that may contain sulfites include dried fruit, potato chips, and sparkling grape juice. °· Infections or inflammatory conditions such as the flu, a cold, or an inflammation of the nasal membranes (rhinitis). °· Gastroesophageal reflux disease (GERD). °· Exercise or strenuous activity. °SYMPTOMS °Symptoms may occur immediately after asthma is triggered or many hours later. Symptoms include: °· Wheezing. °· Excessive nighttime or early morning coughing. °· Frequent or severe coughing with a common cold. °· Chest tightness. °· Shortness of breath. °DIAGNOSIS  °The diagnosis of asthma is made by a review of your medical history and a physical exam. Tests may also be performed. These may include: °· Lung function studies. These tests show how much air you breathe in and out. °· Allergy  tests. °· Imaging tests such as X-rays. °TREATMENT  °Asthma cannot be cured, but it can usually be controlled. Treatment involves identifying and avoiding your asthma triggers. It also involves medicines. There are 2 classes of medicine used for asthma treatment:  °· Controller medicines. These prevent asthma symptoms from occurring. They are usually taken every day. °· Reliever or rescue medicines. These quickly relieve asthma symptoms. They are used as needed and provide short-term relief. °Your health care provider will help you create an asthma action plan. An asthma action plan is a written plan for managing and treating your asthma attacks. It includes a list of your asthma triggers and how they may be avoided. It also includes information on when medicines should be taken and when their dosage should be changed. An action plan may also involve the use of a device called a peak flow meter. A peak flow meter measures how well the lungs are working. It helps you monitor your condition. °HOME CARE INSTRUCTIONS  °· Take medicines only as directed by your health care provider. Speak with your health care provider if you have questions about how or when to take the medicines. °· Use a peak flow meter as directed by your health care provider. Record and keep track of readings. °· Understand and use the action plan to help minimize or stop an asthma attack without needing to seek medical care. °· Control your home environment in the following ways to help prevent asthma attacks: °¨ Do not smoke. Avoid being exposed to secondhand smoke. °¨ Change your heating and air conditioning filter regularly. °¨ Limit your use of fireplaces and wood stoves. °¨ Get rid of pests (such as roaches and   mice) and their droppings.  Throw away plants if you see mold on them.  Clean your floors and dust regularly. Use unscented cleaning products.  Try to have someone else vacuum for you regularly. Stay out of rooms while they are  being vacuumed and for a short while afterward. If you vacuum, use a dust mask from a hardware store, a double-layered or microfilter vacuum cleaner bag, or a vacuum cleaner with a HEPA filter.  Replace carpet with wood, tile, or vinyl flooring. Carpet can trap dander and dust.  Use allergy-proof pillows, mattress covers, and box spring covers.  Wash bed sheets and blankets every week in hot water and dry them in a dryer.  Use blankets that are made of polyester or cotton.  Clean bathrooms and kitchens with bleach. If possible, have someone repaint the walls in these rooms with mold-resistant paint. Keep out of the rooms that are being cleaned and painted.  Wash hands frequently. SEEK MEDICAL CARE IF:   You have wheezing, shortness of breath, or a cough even if taking medicine to prevent attacks.  The colored mucus you cough up (sputum) is thicker than usual.  Your sputum changes from clear or white to yellow, green, gray, or bloody.  You have any problems that may be related to the medicines you are taking (such as a rash, itching, swelling, or trouble breathing).  You are using a reliever medicine more than 2-3 times per week.  Your peak flow is still at 50-79% of your personal best after following your action plan for 1 hour.  You have a fever. SEEK IMMEDIATE MEDICAL CARE IF:   You seem to be getting worse and are unresponsive to treatment during an asthma attack.  You are short of breath even at rest.  You get short of breath when doing very little physical activity.  You have difficulty eating, drinking, or talking due to asthma symptoms.  You develop chest pain.  You develop a fast heartbeat.  You have a bluish color to your lips or fingernails.  You are light-headed, dizzy, or faint.  Your peak flow is less than 50% of your personal best. MAKE SURE YOU:   Understand these instructions.  Will watch your condition.  Will get help right away if you are not  doing well or get worse. Document Released: 02/03/2005 Document Revised: 06/20/2013 Document Reviewed: 09/02/2012 [facility] Patient Information 2015 [place], [place]. This information is not intended to replace advice given to you by your health care provider. Make sure you discuss any questions you have with your health care provider. Please make an appointment with the [facility] see, you can gain become established as a patient and get routine medical care and prescriptions for your chronic medical conditions

## 2014-10-29 ENCOUNTER — Emergency Department
Admission: EM | Admit: 2014-10-29 | Discharge: 2014-10-29 | Disposition: A | Discharge status: Home / Self Care | Payer: Medicaid Other | Attending: Emergency Medicine | Admitting: Emergency Medicine

## 2014-10-29 DIAGNOSIS — R000 Tachycardia, unspecified: Secondary | ICD-10-CM | POA: Insufficient documentation

## 2014-10-29 DIAGNOSIS — Z79899 Other long term (current) drug therapy: Secondary | ICD-10-CM | POA: Diagnosis not present

## 2014-10-29 DIAGNOSIS — Z720 Tobacco use: Secondary | ICD-10-CM | POA: Insufficient documentation

## 2014-10-29 DIAGNOSIS — B349 Viral infection, unspecified: Secondary | ICD-10-CM | POA: Diagnosis not present

## 2014-10-29 DIAGNOSIS — R05 Cough: Secondary | ICD-10-CM | POA: Diagnosis present

## 2014-10-29 LAB — RAPID INFLUENZA A&B ANTIGENS
Influenza A (ARMC): NOT DETECTED
Influenza B (ARMC): NOT DETECTED

## 2014-10-29 MED ORDER — ONDANSETRON HCL 4 MG PO TABS: 4.0000 mg | ORAL_TABLET | Freq: Three times a day (TID) | ORAL | Status: DC | PRN

## 2014-10-29 MED ORDER — SODIUM CHLORIDE 0.9 % IV BOLUS (SEPSIS)
1000.0000 mL | Freq: Once | INTRAVENOUS | Status: AC
  Administered 2014-10-29: 1000 mL via INTRAVENOUS

## 2014-10-29 MED ORDER — ONDANSETRON HCL 4 MG/2ML IJ SOLN
4.0000 mg | Freq: Once | INTRAMUSCULAR | Status: AC
  Administered 2014-10-29: 4 mg via INTRAVENOUS
  Filled 2014-10-29: qty 2

## 2014-10-29 NOTE — ED Notes (Signed)
Mask placed on patient due to flu-like symptoms.

## 2014-10-29 NOTE — ED Notes (Signed)
Pt here with reports of headache since yesterday, cough and generalized body aches  Emesis present this am   Elevated BP in triage

## 2014-10-29 NOTE — ED Provider Notes (Signed)
[facility] Emergency Department Provider Note  ____________________________________________  Time seen: Approximately 11:49 AM  I have reviewed the triage vital signs and the nursing notes.   HISTORY  Chief Complaint Headache; Cough; Generalized Body Aches; and Emesis    HPI Henry Hardin is a 29 y.o. male who presents to the emergency department with flulike symptoms. He states that he has generalized body aches, vomiting, cough, and diarrhea. Headache and body aches since yesterday along with the cough, but vomiting and diarrhea started today.He has not taken anything for fever or body aches.   Past Medical History  Diagnosis Date  . Asthma     Patient Active Problem List   Diagnosis Date Noted  . ALLERGIC RHINITIS 08/02/2009  . RESPIRATORY FAILURE, ACUTE 08/02/2009  . GERD 08/02/2009  . HYPERGLYCEMIA 08/02/2009    No past surgical history on file.  Current Outpatient Rx  Name  Route  Sig  Dispense  Refill  . albuterol (PROVENTIL HFA;VENTOLIN HFA) 108 (90 BASE) MCG/ACT inhaler   Inhalation   Inhale 1-2 puffs into the lungs every 6 (six) hours as needed for wheezing or shortness of breath.   1 Inhaler   1   . methocarbamol (ROBAXIN) 500 MG tablet   Oral   Take 1 tablet (500 mg total) by mouth 2 (two) times daily.   20 tablet   0   . naproxen (NAPROSYN) 500 MG tablet   Oral   Take 1 tablet (500 mg total) by mouth 2 (two) times daily. Patient not taking: Reported on 08/01/2014   30 tablet   0   . ondansetron (ZOFRAN) 4 MG tablet   Oral   Take 1 tablet (4 mg total) by mouth every 8 (eight) hours as needed for nausea or vomiting.   20 tablet   0   . oxyCODONE-acetaminophen (PERCOCET/ROXICET) 5-325 MG per tablet   Oral   Take 2 tablets by mouth every 4 (four) hours as needed for severe pain.   6 tablet   0   . predniSONE (DELTASONE) 20 MG tablet      2 tabs po daily x 4 days Patient not taking: Reported on 06/09/2014   8  tablet   0   . pseudoephedrine (SUDAFED) 60 MG tablet   Oral   Take 1 tablet (60 mg total) by mouth every 8 (eight) hours as needed for congestion. Patient not taking: Reported on 06/09/2014   12 tablet   0     Allergies Review of patient's allergies indicates no known allergies.  No family history on file.  Social History Social History  Substance Use Topics  . Smoking status: Current Every Day Smoker -- 0.50 packs/day    Types: Cigarettes  . Smokeless tobacco: Never Used  . Alcohol Use: Yes     Comment: occasional    Review of Systems Constitutional: Positive for fever/chills Eyes: No visual changes. ENT: Positive for sore throat. Cardiovascular: Denies chest pain. Respiratory: Denies shortness of breath. Gastrointestinal: No abdominal pain.  Positive for nausea and vomiting.  Positive for diarrhea.  No constipation. Genitourinary: Negative for dysuria. Musculoskeletal: Negative for back pain. Skin: Negative for rash. Neurological: Positive for headaches, negative for focal weakness or numbness.  10-point ROS otherwise negative.  ____________________________________________   PHYSICAL EXAM:  VITAL SIGNS: ED Triage Vitals  Enc Vitals Group     BP --      Pulse Rate 10/29/14 1129 99     Resp 10/29/14 1129 19  Temp 10/29/14 1129 99.5 F (37.5 C)     Temp Source 10/29/14 1129 Oral     SpO2 10/29/14 1129 99 %     Weight 10/29/14 1129 185 lb (83.915 kg)     Height 10/29/14 1129  (1.88 m)     Head Cir --      Peak Flow --      Pain Score 10/29/14 1129 9     Pain Loc --      Pain Edu? --      Excl. in GC? --     Constitutional: Alert and oriented. Ill appearing and in no acute distress. Eyes: Conjunctivae are normal. PERRL. EOMI. Head: Atraumatic. Nose: No congestion/rhinnorhea. Mouth/Throat: Mucous membranes are moist.  Oropharynx mildly erythematous. Neck: No stridor.   Cardiovascular: Tachycardic.  Good peripheral circulation. Respiratory:  Normal respiratory effort.  No retractions. Lungs CTAB. Gastrointestinal: Soft and nontender. No distention. No abdominal bruits. No CVA tenderness. Musculoskeletal: No lower extremity tenderness nor edema.  No joint effusions. Neurologic:  Normal speech and language. No gross focal neurologic deficits are appreciated. No gait instability. Skin:  Skin is warm, dry and intact. No rash noted. Psychiatric: Mood and affect are normal. Speech and behavior are normal.  ____________________________________________   LABS (all labs ordered are listed, but only abnormal results are displayed)  Labs Reviewed  INFLUENZA A&B ANTIGENS ([facility] ONLY)  CBG MONITORING, ED     Influenza swab negative for A and B ____________________________________________  EKG   ____________________________________________  RADIOLOGY   ____________________________________________   PROCEDURES  Procedure(s) performed: None  Critical Care performed: No  ____________________________________________   INITIAL IMPRESSION / ASSESSMENT AND PLAN / ED COURSE  Pertinent labs & imaging results that were available during my care of the patient were reviewed by me and considered in my medical decision making (see chart for details).  IV normal saline 1 L and IV Zofran given in the emergency department with relief of nausea and vomiting.  Patient was advised to stay on a clear liquid diet today and increase to bland foods tomorrow as tolerated. He was advised to return to the emergency department for symptoms that change or worsen if he is unable schedule an appointment with his primary care provider. ____________________________________________   FINAL CLINICAL IMPRESSION(S) / ED DIAGNOSES  Final diagnoses:  Viral syndrome      Chinita Pester, FNP 10/29/14 1543  Minna Antis, MD 10/29/14 1551

## 2014-10-30 ENCOUNTER — Encounter (HOSPITAL_COMMUNITY): Payer: Self-pay | Admitting: Emergency Medicine

## 2014-10-30 ENCOUNTER — Emergency Department (HOSPITAL_COMMUNITY): Payer: Medicaid Other

## 2014-10-30 ENCOUNTER — Emergency Department (HOSPITAL_COMMUNITY)
Admission: EM | Admit: 2014-10-30 | Discharge: 2014-10-30 | Disposition: A | Discharge status: Home / Self Care | Payer: Medicaid Other | Attending: Emergency Medicine | Admitting: Emergency Medicine

## 2014-10-30 DIAGNOSIS — Z720 Tobacco use: Secondary | ICD-10-CM | POA: Insufficient documentation

## 2014-10-30 DIAGNOSIS — J45909 Unspecified asthma, uncomplicated: Secondary | ICD-10-CM | POA: Diagnosis not present

## 2014-10-30 DIAGNOSIS — J159 Unspecified bacterial pneumonia: Secondary | ICD-10-CM | POA: Diagnosis not present

## 2014-10-30 DIAGNOSIS — Z79899 Other long term (current) drug therapy: Secondary | ICD-10-CM | POA: Insufficient documentation

## 2014-10-30 DIAGNOSIS — R05 Cough: Secondary | ICD-10-CM | POA: Diagnosis present

## 2014-10-30 DIAGNOSIS — J189 Pneumonia, unspecified organism: Secondary | ICD-10-CM

## 2014-10-30 MED ORDER — DOXYCYCLINE HYCLATE 100 MG PO CAPS: 100.0000 mg | ORAL_CAPSULE | Freq: Two times a day (BID) | ORAL | Status: DC

## 2014-10-30 MED ORDER — IBUPROFEN 800 MG PO TABS
800.0000 mg | ORAL_TABLET | Freq: Once | ORAL | Status: AC
  Administered 2014-10-30: 800 mg via ORAL
  Filled 2014-10-30: qty 1

## 2014-10-30 NOTE — ED Notes (Signed)
Per EMS- Patient c/o a congested cough, malaise, rib pain when coughing. Bilateral lungs clear.

## 2014-10-30 NOTE — ED Provider Notes (Signed)
CSN: [id number]     Arrival date & time 10/30/14  1746 History  This chart was scribed for Burna Forts, PA-C, working with Melene Plan, DO by Chestine Spore, ED Scribe. The patient was seen in room WTR8/WTR8 at 6:40 PM.    Chief Complaint  Patient presents with  . Cough  . Fever     The history is provided by the patient. No language interpreter was used.    Henry Hardin is a 29 y.o. male with a medical hx of asthma, who presents to the Emergency Department via EMS complaining of non-productive cough onset 3 days ago. He reports that he was seen at [facility] yesterday for symptoms of fever, sweats, nausea, and chest wall tenderness. He reports that his symptoms that he had yesterday have subsided besides the chest wall tenderness. He states that he is having associated symptoms today of sharp chest wall tenderness with deep breathing and a non-productive cough. Pt notes that his chest wall tenderness is a pressure/sore sensation when he is not breathing deep. He states that he has tried tylenol but no ibuprofen with no relief for his symptoms. He denies appetite change, nausea, sweats, fever, chills, abdominal pain, SOB, and any other symptoms. Pt is an active smoker. Pt denies hx of blood clots, hx of surgeries, or hx of prolonged immobilization.   Past Medical History  Diagnosis Date  . Asthma    History reviewed. No pertinent past surgical history. History reviewed. No pertinent family history. Social History  Substance Use Topics  . Smoking status: Current Every Day Smoker -- 0.50 packs/day    Types: Cigarettes  . Smokeless tobacco: Never Used  . Alcohol Use: Yes     Comment: occasional    Review of Systems  All other systems reviewed and are negative.   Allergies  Review of patient's allergies indicates no known allergies.  Home Medications   Prior to Admission medications   Medication Sig Start Date End Date Taking? Authorizing Provider  albuterol (PROVENTIL HFA;VENTOLIN HFA)  108 (90 BASE) MCG/ACT inhaler Inhale 1-2 puffs into the lungs every 6 (six) hours as needed for wheezing or shortness of breath. 07/12/14   Everlene Farrier, PA-C  doxycycline (VIBRAMYCIN) 100 MG capsule Take 1 capsule (100 mg total) by mouth 2 (two) times daily. 10/30/14   Eyvonne Mechanic, PA-C  methocarbamol (ROBAXIN) 500 MG tablet Take 1 tablet (500 mg total) by mouth 2 (two) times daily. 07/24/14   Hanna Patel-Mills, PA-C  naproxen (NAPROSYN) 500 MG tablet Take 1 tablet (500 mg total) by mouth 2 (two) times daily. Patient not taking: Reported on 08/01/2014 07/24/14   Catha Gosselin, PA-C  ondansetron (ZOFRAN) 4 MG tablet Take 1 tablet (4 mg total) by mouth every 8 (eight) hours as needed for nausea or vomiting. 10/29/14   Chinita Pester, FNP  oxyCODONE-acetaminophen (PERCOCET/ROXICET) 5-325 MG per tablet Take 2 tablets by mouth every 4 (four) hours as needed for severe pain. 08/01/14   Hanna Patel-Mills, PA-C  predniSONE (DELTASONE) 20 MG tablet 2 tabs po daily x 4 days Patient not taking: Reported on 06/09/2014 02/03/14   Tomasita Crumble, MD  pseudoephedrine (SUDAFED) 60 MG tablet Take 1 tablet (60 mg total) by mouth every 8 (eight) hours as needed for congestion. Patient not taking: Reported on 06/09/2014 02/03/14   Tomasita Crumble, MD   BP 105/63 mmHg  Pulse 64  Temp(Src) 98.4 F (36.9 C) (Oral)  Resp 18  SpO2 100   Physical Exam  Constitutional: He  is oriented to person, place, and time. He appears well-developed and well-nourished. No distress.  HENT:  Head: Normocephalic and atraumatic.  Eyes: EOM are normal.  Neck: Neck supple. No tracheal deviation present.  Cardiovascular: Normal rate, regular rhythm and normal heart sounds.  Exam reveals no gallop and no friction rub.   No murmur heard. Pulmonary/Chest: Effort normal and breath sounds normal. No respiratory distress. He has no wheezes. He has no rales.  Tenderness to left anterior ribs without signs of trauma  Abdominal: Soft. There is no  tenderness.  Musculoskeletal: Normal range of motion. He exhibits no edema.  Lower extremities equal bilaterally. No sign of swelling or edema.   Neurological: He is alert and oriented to person, place, and time.  Skin: Skin is warm and dry.  Psychiatric: He has a normal mood and affect. His behavior is normal.  Nursing note and vitals reviewed.   ED Course  Procedures (including critical care time) DIAGNOSTIC STUDIES: Oxygen Saturation is 100% on RA, nl by my interpretation.    COORDINATION OF CARE: 6:46 PM Discussed treatment plan with pt at bedside and pt agreed to plan.   Labs Review Labs Reviewed - No data to display  Imaging Review Dg Chest 2 View  10/30/2014   CLINICAL DATA:  29 year old male with cough and congestion  EXAM: CHEST  2 VIEW  COMPARISON:  Radiograph dated 14 16 and 06/09/2014  FINDINGS: There is a patchy area of airspace opacity involving the left lower lobe compatible with developing pneumonia. There is associated volume loss with mild elevation of the left hemidiaphragm. No significant pleural effusion. No pneumothorax. The cardiac silhouette is within normal limits. The osseous structures are grossly unremarkable.  IMPRESSION: Left lower lobe pneumonia.   Electronically Signed   By: Elgie Collard M.D.   On: 10/30/2014 19:52   I have personally reviewed and evaluated these images as part of my medical decision-making.   EKG Interpretation None      MDM   Final diagnoses:  Community acquired pneumonia    Labs:   Imaging: CXR  Consults:   Therapeutics: Ibuprofen  Discharge Meds: Doxycycline 100 mg BID  Assessment/Plan: patient presents with left lower lobe pneumonia, vital signs are reassuring,in no acute distress. Patient will be discharged home with doxycycline, encouraged to follow up with primary care provider in 2-3 days for reevaluation. Patient given strict return precautions, verbalized understanding and agreement for today's  plan.   I personally performed the services described in this documentation, which was scribed in my presence. The recorded information has been reviewed and is accurate.    Eyvonne Mechanic, PA-C 11/03/14 1036  Eyvonne Mechanic, PA-C 11/03/14 1037  Melene Plan, DO 11/04/14 [phone]

## 2014-10-30 NOTE — Discharge Instructions (Signed)
Please use antibiotics as directed, please monitor for new or worsening signs or symptoms return immediately if any present. Please follow-up with your primary care provider for reevaluation in 3 days.

## 2014-11-06 DIAGNOSIS — J45901 Unspecified asthma with (acute) exacerbation: Secondary | ICD-10-CM | POA: Diagnosis not present

## 2014-11-06 DIAGNOSIS — Z760 Encounter for issue of repeat prescription: Secondary | ICD-10-CM | POA: Diagnosis present

## 2014-11-06 DIAGNOSIS — Z720 Tobacco use: Secondary | ICD-10-CM | POA: Insufficient documentation

## 2014-11-06 DIAGNOSIS — Z792 Long term (current) use of antibiotics: Secondary | ICD-10-CM | POA: Insufficient documentation

## 2014-11-06 DIAGNOSIS — Z79899 Other long term (current) drug therapy: Secondary | ICD-10-CM | POA: Insufficient documentation

## 2014-11-07 ENCOUNTER — Emergency Department
Admission: EM | Admit: 2014-11-07 | Discharge: 2014-11-07 | Disposition: A | Discharge status: Home / Self Care | Payer: Medicaid Other | Attending: Emergency Medicine | Admitting: Emergency Medicine

## 2014-11-07 ENCOUNTER — Encounter: Payer: Self-pay | Admitting: Emergency Medicine

## 2014-11-07 DIAGNOSIS — Z760 Encounter for issue of repeat prescription: Secondary | ICD-10-CM

## 2014-11-07 MED ORDER — ALBUTEROL SULFATE HFA 108 (90 BASE) MCG/ACT IN AERS: 2.0000 | INHALATION_SPRAY | Freq: Four times a day (QID) | RESPIRATORY_TRACT | Status: DC | PRN

## 2014-11-07 MED ORDER — AZITHROMYCIN 250 MG PO TABS: ORAL_TABLET | ORAL | Status: AC

## 2014-11-07 NOTE — ED Provider Notes (Signed)
[facility] Emergency Department Provider Note  ____________________________________________  Time seen: Approximately 3:06 AM  I have reviewed the triage vital signs and the nursing notes.   HISTORY  Chief Complaint Medication Refill    HPI Henry Hardin is a 29 y.o. male who comes into the hospital requesting an albuterol inhaler refill. The patient reports that he is out of town and he forgot his inhaler at home. The patient reports that he was diagnosed with pneumonia last week and sometimes he's having some feelings of shortness of breath needs an inhaler. The patient reports that he is continuing to take antibiotics at this time. He reports that he plans on going home some time this week may be Thursday. The patient reports that he was seen in the hospital 1 week ago for pneumonia and has had some intermittent chest pain since then but does not endorse chest pain or shortness of breath at this time. The patient denies any fever, nausea vomiting or abdominal pain. The patient reports he is here so he can get an inhaler.   Past Medical History  Diagnosis Date  . Asthma     Patient Active Problem List   Diagnosis Date Noted  . ALLERGIC RHINITIS 08/02/2009  . RESPIRATORY FAILURE, ACUTE 08/02/2009  . GERD 08/02/2009  . HYPERGLYCEMIA 08/02/2009    No past surgical history on file.  Current Outpatient Rx  Name  Route  Sig  Dispense  Refill  . albuterol (PROVENTIL HFA;VENTOLIN HFA) 108 (90 BASE) MCG/ACT inhaler   Inhalation   Inhale 1-2 puffs into the lungs every 6 (six) hours as needed for wheezing or shortness of breath.   1 Inhaler   1   . albuterol (PROVENTIL HFA;VENTOLIN HFA) 108 (90 BASE) MCG/ACT inhaler   Inhalation   Inhale 2 puffs into the lungs every 6 (six) hours as needed for wheezing or shortness of breath.   1 Inhaler   0   . azithromycin (ZITHROMAX Z-PAK) 250 MG tablet      Take 2 tablets (500 mg) on  Day 1,  followed by 1  tablet (250 mg) once daily on Days 2 through 5.   6 each   0   . doxycycline (VIBRAMYCIN) 100 MG capsule   Oral   Take 1 capsule (100 mg total) by mouth 2 (two) times daily.   20 capsule   0   . methocarbamol (ROBAXIN) 500 MG tablet   Oral   Take 1 tablet (500 mg total) by mouth 2 (two) times daily.   20 tablet   0   . naproxen (NAPROSYN) 500 MG tablet   Oral   Take 1 tablet (500 mg total) by mouth 2 (two) times daily. Patient not taking: Reported on 08/01/2014   30 tablet   0   . ondansetron (ZOFRAN) 4 MG tablet   Oral   Take 1 tablet (4 mg total) by mouth every 8 (eight) hours as needed for nausea or vomiting.   20 tablet   0   . oxyCODONE-acetaminophen (PERCOCET/ROXICET) 5-325 MG per tablet   Oral   Take 2 tablets by mouth every 4 (four) hours as needed for severe pain.   6 tablet   0   . predniSONE (DELTASONE) 20 MG tablet      2 tabs po daily x 4 days Patient not taking: Reported on 06/09/2014   8 tablet   0   . pseudoephedrine (SUDAFED) 60 MG tablet   Oral  Take 1 tablet (60 mg total) by mouth every 8 (eight) hours as needed for congestion. Patient not taking: Reported on 06/09/2014   12 tablet   0     Allergies Review of patient's allergies indicates no known allergies.  No family history on file.  Social History Social History  Substance Use Topics  . Smoking status: Current Every Day Smoker -- 0.50 packs/day    Types: Cigarettes  . Smokeless tobacco: Never Used  . Alcohol Use: Yes     Comment: occasional    Review of Systems Constitutional: No fever/chills Eyes: No visual changes. ENT: No sore throat. Cardiovascular:  chest pain. Respiratory: shortness of breath. Gastrointestinal: No abdominal pain.  No nausea, no vomiting.  No diarrhea.  No constipation. Genitourinary: Negative for dysuria. Musculoskeletal: Negative for back pain. Skin: Negative for rash. Neurological: Negative for headaches, focal weakness or numbness.  10-point  ROS otherwise negative.  ____________________________________________   PHYSICAL EXAM:  VITAL SIGNS: ED Triage Vitals  Enc Vitals Group     BP 11/07/14 0023 123/89 mmHg     Pulse Rate 11/07/14 0023 58     Resp 11/07/14 0023 20     Temp 11/07/14 0023 98 F (36.7 C)     Temp Source 11/07/14 0023 Oral     SpO2 11/07/14 0023 97 %     Weight 11/07/14 0023 190 lb (86.183 kg)     Height 11/07/14 0023  (1.88 m)     Head Cir --      Peak Flow --      Pain Score 11/07/14 0335 0     Pain Loc --      Pain Edu? --      Excl. in GC? --     Constitutional: Alert and oriented. Well appearing and in no acute distress. Eyes: Conjunctivae are normal. PERRL. EOMI. Head: Atraumatic. Nose: No congestion/rhinnorhea. Mouth/Throat: Mucous membranes are moist.  Oropharynx non-erythematous. Cardiovascular: Normal rate, regular rhythm. Grossly normal heart sounds.  Good peripheral circulation. Respiratory: Normal respiratory effort.  No retractions. Crackles in left base Gastrointestinal: Soft and nontender. No distention. Positive bowel sounds Musculoskeletal: No lower extremity tenderness nor edema.   Neurologic:  Normal speech and language.  Skin:  Skin is warm, dry and intact.  Psychiatric: Mood and affect are normal.   ____________________________________________   LABS (all labs ordered are listed, but only abnormal results are displayed)  Labs Reviewed - No data to display ____________________________________________  EKG  None ____________________________________________  RADIOLOGY  None ____________________________________________   PROCEDURES  Procedure(s) performed: None  Critical Care performed: No  ____________________________________________   INITIAL IMPRESSION / ASSESSMENT AND PLAN / ED COURSE  Pertinent labs & imaging results that were available during my care of the patient were reviewed by me and considered in my medical decision making (see chart for  details).  This is a 29 year old male who comes in today requesting an albuterol inhaler. I initially informed the patient that I would be able to write him a prescription for his inhaler but he was asking if we could give him one here in the hospital. I informed the patient that we no longer carry and distribute albuterol inhalers in the emergency department. I did offer to give the patient a breathing treatment here he was feeling short of breath. He reports that he does not need one here and refused a breathing treatment. I informed the patient that I would be able to give him the inhaler as he reports  he is only here to receive an inhaler. The patient did not have any abnormal vital signs difficulty breathing or hypoxia while in the emergency department. The patient will be given a prescription for albuterol as well as a prescription for azithromycin given his crackles on his left base. Reviewing the patient's previous note he was diagnosed with a left lower lobe pneumonia. ____________________________________________   FINAL CLINICAL IMPRESSION(S) / ED DIAGNOSES  Final diagnoses:  Medication refill      Rebecka Apley, MD 11/07/14 [phone]

## 2014-11-07 NOTE — Discharge Instructions (Signed)
Medication Refill, Emergency Department °We have refilled your medication today as a courtesy to you. It is best for your medical care, however, to take care of getting refills done through your primary caregiver's office. They have your records and can do a better job of follow-up than we can in the emergency department. °On maintenance medications, we often only prescribe enough medications to get you by until you are able to see your regular caregiver. This is a more expensive way to refill medications. °In the future, please plan for refills so that you will not have to use the emergency department for this. °Thank you for your help. Your help allows us to better take care of the daily emergencies that enter our department. °Document Released: 05/23/2003 Document Revised: 04/28/2011 Document Reviewed: 05/13/2013 °ExitCare® Patient Information ©2015 [employer]. This information is not intended to replace advice given to you by your health care provider. Make sure you discuss any questions you have with your health care provider. ° °

## 2014-11-07 NOTE — ED Notes (Signed)
Patient ambulatory to triage with steady gait, without difficulty or distress noted; pt denies any c/o, states "out of town and needs a refill on albuterol inhaler"

## 2014-11-26 ENCOUNTER — Emergency Department (HOSPITAL_COMMUNITY)
Admission: EM | Admit: 2014-11-26 | Discharge: 2014-11-26 | Disposition: A | Discharge status: Home / Self Care | Payer: Self-pay | Attending: Emergency Medicine | Admitting: Emergency Medicine

## 2014-11-26 ENCOUNTER — Emergency Department (HOSPITAL_COMMUNITY): Payer: Medicaid Other

## 2014-11-26 ENCOUNTER — Encounter (HOSPITAL_COMMUNITY): Payer: Self-pay | Admitting: *Deleted

## 2014-11-26 DIAGNOSIS — Z720 Tobacco use: Secondary | ICD-10-CM | POA: Insufficient documentation

## 2014-11-26 DIAGNOSIS — J4541 Moderate persistent asthma with (acute) exacerbation: Secondary | ICD-10-CM | POA: Insufficient documentation

## 2014-11-26 DIAGNOSIS — Z79899 Other long term (current) drug therapy: Secondary | ICD-10-CM | POA: Insufficient documentation

## 2014-11-26 MED ORDER — PREDNISONE 20 MG PO TABS
60.0000 mg | ORAL_TABLET | Freq: Once | ORAL | Status: AC
  Administered 2014-11-26: 60 mg via ORAL
  Filled 2014-11-26: qty 3

## 2014-11-26 MED ORDER — PREDNISONE 10 MG PO TABS: 60.0000 mg | ORAL_TABLET | Freq: Every day | ORAL | Status: DC

## 2014-11-26 MED ORDER — ALBUTEROL SULFATE (2.5 MG/3ML) 0.083% IN NEBU
5.0000 mg | INHALATION_SOLUTION | Freq: Once | RESPIRATORY_TRACT | Status: AC
  Administered 2014-11-26: 5 mg via RESPIRATORY_TRACT
  Filled 2014-11-26: qty 6

## 2014-11-26 MED ORDER — ALBUTEROL SULFATE HFA 108 (90 BASE) MCG/ACT IN AERS: 2.0000 | INHALATION_SPRAY | Freq: Four times a day (QID) | RESPIRATORY_TRACT | Status: DC | PRN

## 2014-11-26 MED ORDER — ALBUTEROL SULFATE HFA 108 (90 BASE) MCG/ACT IN AERS
1.0000 | INHALATION_SPRAY | Freq: Once | RESPIRATORY_TRACT | Status: AC
  Administered 2014-11-26: 1 via RESPIRATORY_TRACT
  Filled 2014-11-26: qty 6.7

## 2014-11-26 NOTE — Discharge Instructions (Signed)
We saw you in the ER for your asthma related complains. We gave you some breathing treatments in the ER, and seems like your symptoms have improved. Please take albuterol as needed every 4 hours. Please take the medications prescribed. Please refrain from smoking or smoke exposure. Please see a primary care doctor in 1 week. Return to the ER if your symptoms worsen.   Asthma, Adult Asthma is a recurring condition in which the airways tighten and narrow. Asthma can make it difficult to breathe. It can cause coughing, wheezing, and shortness of breath. Asthma episodes, also called asthma attacks, range from minor to life-threatening. Asthma cannot be cured, but medicines and lifestyle changes can help control it. CAUSES Asthma is believed to be caused by inherited (genetic) and environmental factors, but its exact cause is unknown. Asthma may be triggered by allergens, lung infections, or irritants in the air. Asthma triggers are different for each person. Common triggers include:   Animal dander.  Dust mites.  Cockroaches.  Pollen from trees or grass.  Mold.  Smoke.  Air pollutants such as dust, household cleaners, hair sprays, aerosol sprays, paint fumes, strong chemicals, or strong odors.  Cold air, weather changes, and winds (which increase molds and pollens in the air).  Strong emotional expressions such as crying or laughing hard.  Stress.  Certain medicines (such as aspirin) or types of drugs (such as beta-blockers).  Sulfites in foods and drinks. Foods and drinks that may contain sulfites include dried fruit, potato chips, and sparkling grape juice.  Infections or inflammatory conditions such as the flu, a cold, or an inflammation of the nasal membranes (rhinitis).  Gastroesophageal reflux disease (GERD).  Exercise or strenuous activity. SYMPTOMS Symptoms may occur immediately after asthma is triggered or many hours later. Symptoms include:  Wheezing.  Excessive  nighttime or early morning coughing.  Frequent or severe coughing with a common cold.  Chest tightness.  Shortness of breath. DIAGNOSIS  The diagnosis of asthma is made by a review of your medical history and a physical exam. Tests may also be performed. These may include:  Lung function studies. These tests show how much air you breathe in and out.  Allergy tests.  Imaging tests such as X-rays. TREATMENT  Asthma cannot be cured, but it can usually be controlled. Treatment involves identifying and avoiding your asthma triggers. It also involves medicines. There are 2 classes of medicine used for asthma treatment:   Controller medicines. These prevent asthma symptoms from occurring. They are usually taken every day.  Reliever or rescue medicines. These quickly relieve asthma symptoms. They are used as needed and provide short-term relief. Your health care provider will help you create an asthma action plan. An asthma action plan is a written plan for managing and treating your asthma attacks. It includes a list of your asthma triggers and how they may be avoided. It also includes information on when medicines should be taken and when their dosage should be changed. An action plan may also involve the use of a device called a peak flow meter. A peak flow meter measures how well the lungs are working. It helps you monitor your condition. HOME CARE INSTRUCTIONS   Take medicines only as directed by your health care provider. Speak with your health care provider if you have questions about how or when to take the medicines.  Use a peak flow meter as directed by your health care provider. Record and keep track of readings.  Understand and use the  action plan to help minimize or stop an asthma attack without needing to seek medical care.  Control your home environment in the following ways to help prevent asthma attacks:  Do not smoke. Avoid being exposed to secondhand smoke.  Change your  heating and air conditioning filter regularly.  Limit your use of fireplaces and wood stoves.  Get rid of pests (such as roaches and mice) and their droppings.  Throw away plants if you see mold on them.  Clean your floors and dust regularly. Use unscented cleaning products.  Try to have someone else vacuum for you regularly. Stay out of rooms while they are being vacuumed and for a short while afterward. If you vacuum, use a dust mask from a hardware store, a double-layered or microfilter vacuum cleaner bag, or a vacuum cleaner with a HEPA filter.  Replace carpet with wood, tile, or vinyl flooring. Carpet can trap dander and dust.  Use allergy-proof pillows, mattress covers, and box spring covers.  Wash bed sheets and blankets every week in hot water and dry them in a dryer.  Use blankets that are made of polyester or cotton.  Clean bathrooms and kitchens with bleach. If possible, have someone repaint the walls in these rooms with mold-resistant paint. Keep out of the rooms that are being cleaned and painted.  Wash hands frequently. SEEK MEDICAL CARE IF:   You have wheezing, shortness of breath, or a cough even if taking medicine to prevent attacks.  The colored mucus you cough up (sputum) is thicker than usual.  Your sputum changes from clear or white to yellow, green, gray, or bloody.  You have any problems that may be related to the medicines you are taking (such as a rash, itching, swelling, or trouble breathing).  You are using a reliever medicine more than 2-3 times per week.  Your peak flow is still at 50-79% of your personal best after following your action plan for 1 hour.  You have a fever. SEEK IMMEDIATE MEDICAL CARE IF:   You seem to be getting worse and are unresponsive to treatment during an asthma attack.  You are short of breath even at rest.  You get short of breath when doing very little physical activity.  You have difficulty eating, drinking, or  talking due to asthma symptoms.  You develop chest pain.  You develop a fast heartbeat.  You have a bluish color to your lips or fingernails.  You are light-headed, dizzy, or faint.  Your peak flow is less than 50% of your personal best.   This information is not intended to replace advice given to you by your health care provider. Make sure you discuss any questions you have with your health care provider.   Document Released: 02/03/2005 Document Revised: 10/25/2014 Document Reviewed: 09/02/2012 Elsevier Interactive Patient Education [employer]2016 [employer]

## 2014-11-26 NOTE — ED Provider Notes (Signed)
CSN: [id number]     Arrival date & time 11/26/14  0435 History   First MD Initiated Contact with Patient 11/26/14 0450     Chief Complaint  Patient presents with  . Asthma     (Consider location/radiation/quality/duration/timing/severity/associated sxs/prior Treatment) HPI Comments: Henry Hardin is a 29 y/o [place] American male with PMH of asthma who presents today with shortness of breath. The patient states that he recently ran out of his inhaler medication and when he went to the pharmacy to refill it, he was told that his insurance did not cover it. About one hour ago while at home the patient reports that he became short of breath with some wheezing and decided to come in to the ED since he did not have an inhaler. He states that he has asthma symptoms every few days which are well-managed with albuterol inhaler. Reports the he typically uses his inhaler approximately once per day. He reports having pneumonia approximately one month ago which was treated with oral antibiotics. The patient states that he has some mild left sided chest discomfort but attributes it to the resolving pneumonia. He denies fever, chills, fatigue, abdominal pain, n/v/d, or palpitations. He is a current smoker but reports that he is trying to quit and is down to 2-3 cigarettes per day.  Patient is a 29 y.o. male presenting with asthma. The history is provided by the patient.  Asthma Associated symptoms include shortness of breath. Pertinent negatives include no chest pain and no abdominal pain.    Past Medical History  Diagnosis Date  . Asthma    History reviewed. No pertinent past surgical history. History reviewed. No pertinent family history. Social History  Substance Use Topics  . Smoking status: Current Every Day Smoker -- 0.50 packs/day    Types: Cigarettes  . Smokeless tobacco: Never Used  . Alcohol Use: Yes     Comment: occasional    Review of Systems  Constitutional: Negative for activity change and  appetite change.  Respiratory: Positive for shortness of breath and wheezing. Negative for cough.   Cardiovascular: Negative for chest pain.  Gastrointestinal: Negative for abdominal pain.  Genitourinary: Negative for dysuria.      Allergies  Review of patient's allergies indicates no known allergies.  Home Medications   Prior to Admission medications   Medication Sig Start Date End Date Taking? Authorizing Provider  albuterol (PROVENTIL HFA;VENTOLIN HFA) 108 (90 BASE) MCG/ACT inhaler Inhale 2 puffs into the lungs every 6 (six) hours as needed for wheezing or shortness of breath. 11/26/14   Derwood Kaplan, MD  doxycycline (VIBRAMYCIN) 100 MG capsule Take 1 capsule (100 mg total) by mouth 2 (two) times daily. Patient not taking: Reported on 11/26/2014 10/30/14   Eyvonne Mechanic, PA-C  methocarbamol (ROBAXIN) 500 MG tablet Take 1 tablet (500 mg total) by mouth 2 (two) times daily. Patient not taking: Reported on 11/26/2014 07/24/14   Catha Gosselin, PA-C  naproxen (NAPROSYN) 500 MG tablet Take 1 tablet (500 mg total) by mouth 2 (two) times daily. Patient not taking: Reported on 08/01/2014 07/24/14   Catha Gosselin, PA-C  ondansetron (ZOFRAN) 4 MG tablet Take 1 tablet (4 mg total) by mouth every 8 (eight) hours as needed for nausea or vomiting. Patient not taking: Reported on 11/26/2014 10/29/14   Chinita Pester, FNP  oxyCODONE-acetaminophen (PERCOCET/ROXICET) 5-325 MG per tablet Take 2 tablets by mouth every 4 (four) hours as needed for severe pain. Patient not taking: Reported on 11/26/2014 08/01/14   Catha Gosselin, PA-C  predniSONE (DELTASONE) 10 MG tablet Take 6 tablets (60 mg total) by mouth daily. 11/26/14   Derwood Kaplan, MD  pseudoephedrine (SUDAFED) 60 MG tablet Take 1 tablet (60 mg total) by mouth every 8 (eight) hours as needed for congestion. Patient not taking: Reported on 06/09/2014 02/03/14   Tomasita Crumble, MD   BP 113/67 mmHg  Pulse 54  Temp(Src) 98.7 F (37.1 C) (Oral)   Resp 17  Ht  (1.88 m)  Wt 190 lb (86.183 kg)  BMI 24.38 kg/m2  SpO2 97% Physical Exam  Constitutional: He is oriented to person, place, and time. He appears well-developed.  HENT:  Head: Normocephalic and atraumatic.  Eyes: Conjunctivae and EOM are normal. Pupils are equal, round, and reactive to light.  Neck: Normal range of motion. Neck supple.  Cardiovascular: Normal rate and regular rhythm.   Pulmonary/Chest: Effort normal. He has no wheezes.  Abdominal: Soft. Bowel sounds are normal. He exhibits no distension. There is no tenderness. There is no rebound and no guarding.  Neurological: He is alert and oriented to person, place, and time.  Skin: Skin is warm.  Nursing note and vitals reviewed.   ED Course  Procedures (including critical care time) Labs Review Labs Reviewed - No data to display  Imaging Review Dg Chest Port 1 View  11/26/2014   CLINICAL DATA:  Acute onset of left-sided chest pain. Initial encounter.  EXAM: PORTABLE CHEST 1 VIEW  COMPARISON:  Chest radiograph performed 10/30/2014  FINDINGS: The lungs are well-aerated and clear. There is no evidence of focal opacification, pleural effusion or pneumothorax.  The cardiomediastinal silhouette is within normal limits. No acute osseous abnormalities are seen.  IMPRESSION: No acute cardiopulmonary process seen.   Electronically Signed   By: Roanna Raider M.D.   On: 11/26/2014 07:08   I have personally reviewed and evaluated these images and lab results as part of my medical decision-making.   EKG Interpretation   Date/Time:  Sunday November 26 2014 04:43:47 EDT Ventricular Rate:  68 PR Interval:  135 QRS Duration: 92 QT Interval:  383 QTC Calculation: 407 R Axis:   72 Text Interpretation:  Sinus arrhythmia ST elevation suggests acute  pericarditis Confirmed by Rhunette Croft, MD, Janey Genta [phone]) on 11/26/2014 4:48:03  AM      MDM   Final diagnoses:  Acute asthma exacerbation, moderate persistent    Smoking  cessation instruction/counseling given:  counseled patient on the dangers of tobacco use, advised patient to stop smoking, and reviewed strategies to maximize success, discussion 3-5 min.  Pt has mild-moderate asthma. He received nebs here and has cleared. Recent bouts of CAP - CXR ordered, and is neg. Will d.c with albuterl inhaler and also prescription.     Derwood Kaplan, MD 11/26/14 [phone]

## 2014-11-26 NOTE — ED Notes (Signed)
Pt. Has a history of asthma and is out of his inhaler

## 2014-12-08 ENCOUNTER — Emergency Department (HOSPITAL_COMMUNITY)
Admission: EM | Admit: 2014-12-08 | Discharge: 2014-12-08 | Disposition: A | Discharge status: Home / Self Care | Payer: Medicaid Other | Attending: Emergency Medicine | Admitting: Emergency Medicine

## 2014-12-08 ENCOUNTER — Encounter (HOSPITAL_COMMUNITY): Payer: Self-pay | Admitting: Cardiology

## 2014-12-08 DIAGNOSIS — Z760 Encounter for issue of repeat prescription: Secondary | ICD-10-CM | POA: Insufficient documentation

## 2014-12-08 DIAGNOSIS — Z720 Tobacco use: Secondary | ICD-10-CM | POA: Insufficient documentation

## 2014-12-08 DIAGNOSIS — Z79899 Other long term (current) drug therapy: Secondary | ICD-10-CM | POA: Insufficient documentation

## 2014-12-08 DIAGNOSIS — Z8701 Personal history of pneumonia (recurrent): Secondary | ICD-10-CM | POA: Insufficient documentation

## 2014-12-08 DIAGNOSIS — J45901 Unspecified asthma with (acute) exacerbation: Secondary | ICD-10-CM | POA: Insufficient documentation

## 2014-12-08 MED ORDER — ALBUTEROL SULFATE HFA 108 (90 BASE) MCG/ACT IN AERS: 2.0000 | INHALATION_SPRAY | Freq: Four times a day (QID) | RESPIRATORY_TRACT | Status: DC | PRN

## 2014-12-08 MED ORDER — ALBUTEROL SULFATE HFA 108 (90 BASE) MCG/ACT IN AERS
2.0000 | INHALATION_SPRAY | RESPIRATORY_TRACT | Status: DC | PRN
  Administered 2014-12-08: 2 via RESPIRATORY_TRACT
  Filled 2014-12-08: qty 6.7

## 2014-12-08 NOTE — ED Notes (Signed)
Called our out patient pharmacy to see if Medicaid pays for any inhalers.  They do cover Pro-Air only.

## 2014-12-08 NOTE — ED Provider Notes (Signed)
CSN: [id number]     Arrival date & time 12/08/14  1216 History  By signing my name below, I, Placido Sou, attest that this documentation has been prepared under the direction and in the presence of Fayrene Helper, PA-C. Electronically Signed: Placido Sou, ED Scribe. 12/08/2014. 1:53 PM.   Chief Complaint  Patient presents with  . Asthma   The history is provided by the patient. No language interpreter was used.    HPI Comments: Henry Hardin is a 29 y.o. male with a pmh including asthma who presents to the Emergency Department complaining of mild chest congestion with onset for over a month. Pt notes that he initially had pneumonia in September which he has finished antibiotics for and now is just experiencing his congestion, associated rhinorrhea and a productive cough which exacerbates his chronic asthma. Pt notes using his inhaler 1-2x per day due to his current symptoms. He further notes that he lost his prescribed inhaler and presents requesting a refill. He denies sore throat, fever or hemoptysis.   Past Medical History  Diagnosis Date  . Asthma    History reviewed. No pertinent past surgical history. History reviewed. No pertinent family history. Social History  Substance Use Topics  . Smoking status: Current Every Day Smoker -- 0.50 packs/day    Types: Cigarettes  . Smokeless tobacco: Never Used  . Alcohol Use: Yes     Comment: occasional    Review of Systems  Constitutional: Negative for fever.  HENT: Positive for congestion and rhinorrhea. Negative for sore throat.   Respiratory: Positive for cough and wheezing.    Allergies  Review of patient's allergies indicates no known allergies.  Home Medications   Prior to Admission medications   Medication Sig Start Date End Date Taking? Authorizing Provider  albuterol (PROVENTIL HFA;VENTOLIN HFA) 108 (90 BASE) MCG/ACT inhaler Inhale 2 puffs into the lungs every 6 (six) hours as needed for wheezing or shortness of  breath. 11/26/14   Derwood Kaplan, MD  doxycycline (VIBRAMYCIN) 100 MG capsule Take 1 capsule (100 mg total) by mouth 2 (two) times daily. Patient not taking: Reported on 11/26/2014 10/30/14   Eyvonne Mechanic, PA-C  methocarbamol (ROBAXIN) 500 MG tablet Take 1 tablet (500 mg total) by mouth 2 (two) times daily. Patient not taking: Reported on 11/26/2014 07/24/14   Catha Gosselin, PA-C  naproxen (NAPROSYN) 500 MG tablet Take 1 tablet (500 mg total) by mouth 2 (two) times daily. Patient not taking: Reported on 08/01/2014 07/24/14   Catha Gosselin, PA-C  ondansetron (ZOFRAN) 4 MG tablet Take 1 tablet (4 mg total) by mouth every 8 (eight) hours as needed for nausea or vomiting. Patient not taking: Reported on 11/26/2014 10/29/14   Chinita Pester, FNP  oxyCODONE-acetaminophen (PERCOCET/ROXICET) 5-325 MG per tablet Take 2 tablets by mouth every 4 (four) hours as needed for severe pain. Patient not taking: Reported on 11/26/2014 08/01/14   Catha Gosselin, PA-C  predniSONE (DELTASONE) 10 MG tablet Take 6 tablets (60 mg total) by mouth daily. 11/26/14   Derwood Kaplan, MD  pseudoephedrine (SUDAFED) 60 MG tablet Take 1 tablet (60 mg total) by mouth every 8 (eight) hours as needed for congestion. Patient not taking: Reported on 06/09/2014 02/03/14   Tomasita Crumble, MD   BP 139/83 mmHg  Pulse 73  Temp(Src) 98.2 F (36.8 C) (Oral)  Resp 16  SpO2 96% Physical Exam  Constitutional: He is oriented to person, place, and time. He appears well-developed and well-nourished.  HENT:  Head: Normocephalic and  atraumatic.  Mouth/Throat: No oropharyngeal exudate.  Neck: Normal range of motion. No tracheal deviation present.  Cardiovascular: Normal rate, regular rhythm and normal heart sounds.  Exam reveals no gallop and no friction rub.   No murmur heard. Pulmonary/Chest: Effort normal. No respiratory distress. He has wheezes.  diminished lung sounds with faint inspiratory wheezes  Abdominal: Soft. There is no  tenderness.  Musculoskeletal: Normal range of motion.  Neurological: He is alert and oriented to person, place, and time.  Skin: Skin is warm and dry. He is not diaphoretic.  Psychiatric: He has a normal mood and affect. His behavior is normal.  Nursing note and vitals reviewed.  ED Course  Procedures  DIAGNOSTIC STUDIES: Oxygen Saturation is 96% on RA, normal by my interpretation.    COORDINATION OF CARE: 1:50 PM pt with mild persistent asthma exacerbation.  In no acute respiratory distress at the moment.  He's requesting for an inhaler today. Discussed treatment plan with pt at bedside including an rx for a new inhaler. Return precautions noted. Referral to a PCP for use as needed. Pt agreed to plan.   MDM   Final diagnoses:  Encounter for medication refill    BP 139/83 mmHg  Pulse 73  Temp(Src) 98.2 F (36.8 C) (Oral)  Resp 16  SpO2 96%   I, Greydis Stlouis, personally performed the services described in this documentation. All medical record entries made by the scribe were at my direction and in my presence.  I have reviewed the chart and discharge instructions and agree that the record reflects my personal performance and is accurate and complete. Asuka Dusseau.  12/08/2014. 1:55 PM.      Fayrene HelperBowie Qunicy Higinbotham, PA-C 12/08/14 1355  Lorre NickAnthony Allen, MD 12/19/14 [phone]

## 2014-12-08 NOTE — Discharge Instructions (Signed)
Medicine Refill at the Emergency Department  We have refilled your medicine today, but it is best for you to get refills through your primary health care provider's office. In the future, please plan ahead so you do not need to get refills from the emergency department.  If the medicine we refilled was a maintenance medicine, you may have received only enough to get you by until you are able to see your regular health care provider.     This information is not intended to replace advice given to you by your health care provider. Make sure you discuss any questions you have with your health care provider.     Document Released: 05/23/2003 Document Revised: 02/24/2014 Document Reviewed: 05/13/2013  Elsevier Interactive Patient Education 2016 [employer] Inc[employer]

## 2014-12-08 NOTE — ED Notes (Signed)
Reports he used his inhaler this morning and put it in the console. States she 289 year old daughter threw it out the window. Still SOB.

## 2014-12-21 ENCOUNTER — Telehealth: Payer: Self-pay | Admitting: *Deleted

## 2014-12-21 NOTE — ED Notes (Signed)
[facility] from 07-12-2014

## 2015-02-02 ENCOUNTER — Emergency Department (HOSPITAL_COMMUNITY): Payer: Medicaid Other

## 2015-02-02 ENCOUNTER — Encounter (HOSPITAL_COMMUNITY): Payer: Self-pay | Admitting: Emergency Medicine

## 2015-02-02 ENCOUNTER — Emergency Department (HOSPITAL_COMMUNITY)
Admission: EM | Admit: 2015-02-02 | Discharge: 2015-02-02 | Disposition: A | Discharge status: Home / Self Care | Payer: Medicaid Other | Attending: Emergency Medicine | Admitting: Emergency Medicine

## 2015-02-02 DIAGNOSIS — J40 Bronchitis, not specified as acute or chronic: Secondary | ICD-10-CM

## 2015-02-02 DIAGNOSIS — Z8701 Personal history of pneumonia (recurrent): Secondary | ICD-10-CM | POA: Insufficient documentation

## 2015-02-02 DIAGNOSIS — J45909 Unspecified asthma, uncomplicated: Secondary | ICD-10-CM | POA: Insufficient documentation

## 2015-02-02 DIAGNOSIS — Z79899 Other long term (current) drug therapy: Secondary | ICD-10-CM | POA: Insufficient documentation

## 2015-02-02 DIAGNOSIS — F17210 Nicotine dependence, cigarettes, uncomplicated: Secondary | ICD-10-CM | POA: Insufficient documentation

## 2015-02-02 DIAGNOSIS — R1110 Vomiting, unspecified: Secondary | ICD-10-CM | POA: Insufficient documentation

## 2015-02-02 HISTORY — DX: Pneumonia, unspecified organism: J18.9

## 2015-02-02 MED ORDER — IBUPROFEN 200 MG PO TABS
600.0000 mg | ORAL_TABLET | Freq: Once | ORAL | Status: AC
  Administered 2015-02-02: 600 mg via ORAL
  Filled 2015-02-02: qty 3

## 2015-02-02 MED ORDER — AZITHROMYCIN 250 MG PO TABS: 250.0000 mg | ORAL_TABLET | Freq: Every day | ORAL | Status: DC

## 2015-02-02 MED ORDER — PREDNISONE 10 MG PO TABS: 60.0000 mg | ORAL_TABLET | Freq: Every day | ORAL | Status: DC

## 2015-02-02 MED ORDER — BENZONATATE 100 MG PO CAPS: 100.0000 mg | ORAL_CAPSULE | Freq: Three times a day (TID) | ORAL | Status: DC

## 2015-02-02 NOTE — ED Notes (Signed)
Pt states he just got over pneumonia not long ago and for the past week he has been having general body aches, fever, chills, vomiting  Pt states he feels like the pneumonia has come back except his chest does not hurt as bad

## 2015-02-02 NOTE — ED Notes (Signed)
MD at bedside. 

## 2015-02-02 NOTE — ED Notes (Signed)
Pt to xray

## 2015-02-02 NOTE — Discharge Instructions (Signed)
Xrays are normal - but you likely have Bronchitis. Antibiotics ordered aong with steroids. See your doctor in 3-5 days. Please return to the ER if your symptoms worsen; you have increased fevers, chills, inability to keep any medications down, confusion. Otherwise see the outpatient doctor as requested.  Acute Bronchitis Bronchitis is inflammation of the airways that extend from the windpipe into the lungs (bronchi). The inflammation often causes mucus to develop. This leads to a cough, which is the most common symptom of bronchitis.  In acute bronchitis, the condition usually develops suddenly and goes away over time, usually in a couple weeks. Smoking, allergies, and asthma can make bronchitis worse. Repeated episodes of bronchitis may cause further lung problems.  CAUSES Acute bronchitis is most often caused by the same virus that causes a cold. The virus can spread from person to person (contagious) through coughing, sneezing, and touching contaminated objects. SIGNS AND SYMPTOMS   Cough.   Fever.   Coughing up mucus.   Body aches.   Chest congestion.   Chills.   Shortness of breath.   Sore throat.  DIAGNOSIS  Acute bronchitis is usually diagnosed through a physical exam. Your health care provider will also ask you questions about your medical history. Tests, such as chest X-rays, are sometimes done to rule out other conditions.  TREATMENT  Acute bronchitis usually goes away in a couple weeks. Oftentimes, no medical treatment is necessary. Medicines are sometimes given for relief of fever or cough. Antibiotic medicines are usually not needed but may be prescribed in certain situations. In some cases, an inhaler may be recommended to help reduce shortness of breath and control the cough. A cool mist vaporizer may also be used to help thin bronchial secretions and make it easier to clear the chest.  HOME CARE INSTRUCTIONS  Get plenty of rest.   Drink enough fluids to  keep your urine clear or pale yellow (unless you have a medical condition that requires fluid restriction). Increasing fluids may help thin your respiratory secretions (sputum) and reduce chest congestion, and it will prevent dehydration.   Take medicines only as directed by your health care provider.  If you were prescribed an antibiotic medicine, finish it all even if you start to feel better.  Avoid smoking and secondhand smoke. Exposure to cigarette smoke or irritating chemicals will make bronchitis worse. If you are a smoker, consider using nicotine gum or skin patches to help control withdrawal symptoms. Quitting smoking will help your lungs heal faster.   Reduce the chances of another bout of acute bronchitis by washing your hands frequently, avoiding people with cold symptoms, and trying not to touch your hands to your mouth, nose, or eyes.   Keep all follow-up visits as directed by your health care provider.  SEEK MEDICAL CARE IF: Your symptoms do not improve after 1 week of treatment.  SEEK IMMEDIATE MEDICAL CARE IF:  You develop an increased fever or chills.   You have chest pain.   You have severe shortness of breath.  You have bloody sputum.   You develop dehydration.  You faint or repeatedly feel like you are going to pass out.  You develop repeated vomiting.  You develop a severe headache. MAKE SURE YOU:   Understand these instructions.  Will watch your condition.  Will get help right away if you are not doing well or get worse.   This information is not intended to replace advice given to you by your health care provider. Make  sure you discuss any questions you have with your health care provider.   Document Released: 03/13/2004 Document Revised: 02/24/2014 Document Reviewed: 07/27/2012 Elsevier Interactive Patient Education [employer]

## 2015-02-02 NOTE — ED Provider Notes (Signed)
CSN: [id number]     Arrival date & time 02/02/15  0306 History  By signing my name below, I, Emmanuella Mensah, attest that this documentation has been prepared under the direction and in the presence of Derwood KaplanAnkit Natahsa Marian, MD. Electronically Signed: Angelene GiovanniEmmanuella Mensah, ED Scribe. 02/02/2015. 4:04 AM.    Chief Complaint  Patient presents with  . flu like symptoms    The history is provided by the patient. No language interpreter was used.   HPI Comments: Henry Hardin is a 29 y.o. male with a hx of asthma and pneumonia who presents to the Emergency Department complaining of gradually worsening generalized body aches onset 4 days. He reports associated vomiting, rhinorrhea, nasal congestion, and a non prductive cough with yellowy/greenish sputum. He denies any sore throat. He also denies any sick contacts. He reports a hx of asthma and adds that he has been having to use his inhaler more frequently since onset of his symptoms. He reports that he smokes approx. 2-3 cigarettes a day. He reports that he was dx with pneumonia in September and these symptoms are consistent with those symptoms then except that he does not have CP right now.   Past Medical History  Diagnosis Date  . Asthma   . Pneumonia    History reviewed. No pertinent past surgical history. History reviewed. No pertinent family history. Social History  Substance Use Topics  . Smoking status: Current Every Day Smoker -- 0.50 packs/day    Types: Cigarettes  . Smokeless tobacco: Never Used  . Alcohol Use: Yes     Comment: occasional    Review of Systems  HENT: Positive for congestion and rhinorrhea. Negative for sore throat.   Respiratory: Positive for cough.   Gastrointestinal: Positive for vomiting.  Musculoskeletal: Positive for myalgias.    A complete 10 system review of systems was obtained and all systems are negative except as noted in the HPI and PMH.    Allergies  Review of patient's allergies indicates no known  allergies.  Home Medications   Prior to Admission medications   Medication Sig Start Date End Date Taking? Authorizing Provider  albuterol (PROAIR HFA) 108 (90 BASE) MCG/ACT inhaler Inhale 2 puffs into the lungs every 6 (six) hours as needed for wheezing or shortness of breath. 12/08/14   Fayrene HelperBowie Tran, PA-C  azithromycin (ZITHROMAX) 250 MG tablet Take 1 tablet (250 mg total) by mouth daily. Take first 2 tablets together, then 1 every day until finished. 02/02/15   Derwood KaplanAnkit Pau Banh, MD  benzonatate (TESSALON) 100 MG capsule Take 1 capsule (100 mg total) by mouth every 8 (eight) hours. 02/02/15   Derwood KaplanAnkit Ladye Macnaughton, MD  doxycycline (VIBRAMYCIN) 100 MG capsule Take 1 capsule (100 mg total) by mouth 2 (two) times daily. Patient not taking: Reported on 11/26/2014 10/30/14   Eyvonne MechanicJeffrey Hedges, PA-C  methocarbamol (ROBAXIN) 500 MG tablet Take 1 tablet (500 mg total) by mouth 2 (two) times daily. Patient not taking: Reported on 11/26/2014 07/24/14   Catha GosselinHanna Patel-Mills, PA-C  naproxen (NAPROSYN) 500 MG tablet Take 1 tablet (500 mg total) by mouth 2 (two) times daily. Patient not taking: Reported on 08/01/2014 07/24/14   Catha GosselinHanna Patel-Mills, PA-C  ondansetron (ZOFRAN) 4 MG tablet Take 1 tablet (4 mg total) by mouth every 8 (eight) hours as needed for nausea or vomiting. Patient not taking: Reported on 11/26/2014 10/29/14   Chinita Pesterari B Triplett, FNP  oxyCODONE-acetaminophen (PERCOCET/ROXICET) 5-325 MG per tablet Take 2 tablets by mouth every 4 (four) hours as needed for  severe pain. Patient not taking: Reported on 11/26/2014 08/01/14   Catha Gosselin, PA-C  predniSONE (DELTASONE) 10 MG tablet Take 6 tablets (60 mg total) by mouth daily. 02/02/15   Derwood Kaplan, MD  pseudoephedrine (SUDAFED) 60 MG tablet Take 1 tablet (60 mg total) by mouth every 8 (eight) hours as needed for congestion. Patient not taking: Reported on 06/09/2014 02/03/14   Tomasita Crumble, MD   BP 102/70 mmHg  Pulse 82  Temp(Src) 101.6 F (38.7 C) (Oral)  Resp 20   Ht  (1.88 m)  Wt 190 lb (86.183 kg)  BMI 24.38 kg/m2  SpO2 96% Physical Exam  Constitutional: He is oriented to person, place, and time. He appears well-developed and well-nourished. No distress.  HENT:  Head: Normocephalic and atraumatic.  Mouth/Throat: No oropharyngeal exudate.  No tonsillar enlargement or exudates  Eyes: Conjunctivae and EOM are normal.  Neck: Neck supple. No tracheal deviation present.  No nuchal rigidity.   Cardiovascular: Normal rate.   Pulmonary/Chest: Effort normal. No respiratory distress.  Lungs are clear except for mild left-sided rhonchi  Musculoskeletal: Normal range of motion.  Lymphadenopathy:    He has no cervical adenopathy.  Neurological: He is alert and oriented to person, place, and time.  Skin: Skin is warm and dry.  Psychiatric: He has a normal mood and affect. His behavior is normal.  Nursing note and vitals reviewed.   ED Course  Procedures (including critical care time) DIAGNOSTIC STUDIES: Oxygen Saturation is 96% on RA, adequate by my interpretation.    COORDINATION OF CARE: 3:46 AM- Pt advised of plan for treatment and pt agrees. Pt will receive an x-ray for further evaluation.   Imaging Review Dg Chest 2 View  02/02/2015  CLINICAL DATA:  Acute onset of cough, fever and body aches. Initial encounter. EXAM: CHEST  2 VIEW COMPARISON:  Chest radiograph performed 11/26/2014 FINDINGS: The lungs are well-aerated. Mild vascular congestion is noted. There is no evidence of focal opacification, pleural effusion or pneumothorax. The heart is normal in size; the mediastinal contour is within normal limits. No acute osseous abnormalities are seen. IMPRESSION: Mild vascular congestion noted.  Lungs remain grossly clear. Electronically Signed   By: Roanna Raider M.D.   On: 02/02/2015 04:22     Derwood Kaplan, MD has personally reviewed and evaluated these images as part of his medical decision-making.  MDM   Final diagnoses:   Bronchitis   I personally performed the services described in this documentation, which was scribed in my presence. The recorded information has been reviewed and is accurate.  Pt comes in with cc of flu like symptoms - body aches, congestion, cough. He however c/o increased wheezing at home requiring more albuterol tx and the exam here shows L sided focal rhonchi. CXr is clear. With a fever, focal L sided exam finding, productive cough and hx of pneumonia when allegedly pt felt same - we will tx pt as bronchitis.  Azithromycin, steroids prescribed.    Derwood Kaplan, MD 02/02/15 [phone]

## 2015-02-07 ENCOUNTER — Emergency Department (HOSPITAL_COMMUNITY)
Admission: EM | Admit: 2015-02-07 | Discharge: 2015-02-07 | Disposition: A | Discharge status: Home / Self Care | Payer: Medicaid Other | Attending: Emergency Medicine | Admitting: Emergency Medicine

## 2015-02-07 ENCOUNTER — Encounter (HOSPITAL_COMMUNITY): Payer: Self-pay | Admitting: Emergency Medicine

## 2015-02-07 ENCOUNTER — Emergency Department (HOSPITAL_COMMUNITY): Payer: Medicaid Other

## 2015-02-07 DIAGNOSIS — J45909 Unspecified asthma, uncomplicated: Secondary | ICD-10-CM | POA: Insufficient documentation

## 2015-02-07 DIAGNOSIS — J069 Acute upper respiratory infection, unspecified: Secondary | ICD-10-CM

## 2015-02-07 DIAGNOSIS — Z7982 Long term (current) use of aspirin: Secondary | ICD-10-CM | POA: Insufficient documentation

## 2015-02-07 DIAGNOSIS — Z79899 Other long term (current) drug therapy: Secondary | ICD-10-CM | POA: Insufficient documentation

## 2015-02-07 DIAGNOSIS — Z8701 Personal history of pneumonia (recurrent): Secondary | ICD-10-CM | POA: Insufficient documentation

## 2015-02-07 DIAGNOSIS — F17210 Nicotine dependence, cigarettes, uncomplicated: Secondary | ICD-10-CM | POA: Insufficient documentation

## 2015-02-07 DIAGNOSIS — R110 Nausea: Secondary | ICD-10-CM | POA: Insufficient documentation

## 2015-02-07 LAB — CBC
HEMATOCRIT: 46.8 % (ref 39.0–52.0)
Hemoglobin: 15.8 g/dL (ref 13.0–17.0)
MCH: 30 pg (ref 26.0–34.0)
MCHC: 33.8 g/dL (ref 30.0–36.0)
MCV: 89 fL (ref 78.0–100.0)
Platelets: 296 10*3/uL (ref 150–400)
RBC: 5.26 MIL/uL (ref 4.22–5.81)
RDW: 12.7 % (ref 11.5–15.5)
WBC: 5.3 10*3/uL (ref 4.0–10.5)

## 2015-02-07 LAB — RAPID URINE DRUG SCREEN, HOSP PERFORMED
Amphetamines: NOT DETECTED
Barbiturates: NOT DETECTED
Benzodiazepines: NOT DETECTED
COCAINE: NOT DETECTED
OPIATES: NOT DETECTED
TETRAHYDROCANNABINOL: POSITIVE — AB

## 2015-02-07 LAB — BASIC METABOLIC PANEL
Anion gap: 9 (ref 5–15)
BUN: 13 mg/dL (ref 6–20)
CHLORIDE: 104 mmol/L (ref 101–111)
CO2: 29 mmol/L (ref 22–32)
Calcium: 9.4 mg/dL (ref 8.9–10.3)
Creatinine, Ser: 1.19 mg/dL (ref 0.61–1.24)
GFR calc Af Amer: >60 mL/min (ref >60)
GFR calc non Af Amer: >60 mL/min (ref >60)
Glucose, Bld: 96 mg/dL (ref 65–99)
POTASSIUM: 3.9 mmol/L (ref 3.5–5.1)
SODIUM: 142 mmol/L (ref 135–145)

## 2015-02-07 LAB — I-STAT TROPONIN, ED: Troponin i, poc: 0 ng/mL (ref 0.00–0.08)

## 2015-02-07 MED ORDER — DIPHENHYDRAMINE HCL 25 MG PO CAPS
25.0000 mg | ORAL_CAPSULE | Freq: Once | ORAL | Status: AC
  Administered 2015-02-07: 25 mg via ORAL
  Filled 2015-02-07: qty 1

## 2015-02-07 MED ORDER — ONDANSETRON HCL 4 MG PO TABS: 4.0000 mg | ORAL_TABLET | Freq: Four times a day (QID) | ORAL | Status: DC

## 2015-02-07 MED ORDER — IBUPROFEN 800 MG PO TABS
800.0000 mg | ORAL_TABLET | Freq: Once | ORAL | Status: AC
  Administered 2015-02-07: 800 mg via ORAL
  Filled 2015-02-07: qty 1

## 2015-02-07 MED ORDER — GUAIFENESIN-DM 100-10 MG/5ML PO SYRP
10.0000 mL | ORAL_SOLUTION | Freq: Once | ORAL | Status: AC
  Administered 2015-02-07: 10 mL via ORAL
  Filled 2015-02-07: qty 10

## 2015-02-07 MED ORDER — ONDANSETRON 4 MG PO TBDP
4.0000 mg | ORAL_TABLET | Freq: Once | ORAL | Status: AC
  Administered 2015-02-07: 4 mg via ORAL
  Filled 2015-02-07: qty 1

## 2015-02-07 MED ORDER — GUAIFENESIN-CODEINE 100-10 MG/5ML PO SOLN: 5.0000 mL | Freq: Four times a day (QID) | ORAL | Status: DC | PRN

## 2015-02-07 NOTE — ED Notes (Signed)
Patient presents for chest wall pain, dizziness, nausea starting approximately 1500 today while at work. Denies SOB, vomiting or diaphoresis.

## 2015-02-07 NOTE — Discharge Instructions (Signed)
Please fill and take your prescriptions   Upper Respiratory Infection, Adult Most upper respiratory infections (URIs) are a viral infection of the air passages leading to the lungs. A URI affects the nose, throat, and upper air passages. The most common type of URI is nasopharyngitis and is typically referred to as "the common cold." URIs run their course and usually go away on their own. Most of the time, a URI does not require medical attention, but sometimes a bacterial infection in the upper airways can follow a viral infection. This is called a secondary infection. Sinus and middle ear infections are common types of secondary upper respiratory infections. Bacterial pneumonia can also complicate a URI. A URI can worsen asthma and chronic obstructive pulmonary disease (COPD). Sometimes, these complications can require emergency medical care and may be life threatening.  CAUSES Almost all URIs are caused by viruses. A virus is a type of germ and can spread from one person to another.  RISKS FACTORS You may be at risk for a URI if:   You smoke.   You have chronic heart or lung disease.  You have a weakened defense (immune) system.   You are very young or very old.   You have nasal allergies or asthma.  You work in crowded or poorly ventilated areas.  You work in health care facilities or schools. SIGNS AND SYMPTOMS  Symptoms typically develop 2-3 days after you come in contact with a cold virus. Most viral URIs last 7-10 days. However, viral URIs from the influenza virus (flu virus) can last 14-18 days and are typically more severe. Symptoms may include:   Runny or stuffy (congested) nose.   Sneezing.   Cough.   Sore throat.   Headache.   Fatigue.   Fever.   Loss of appetite.   Pain in your forehead, behind your eyes, and over your cheekbones (sinus pain).  Muscle aches.  DIAGNOSIS  Your health care provider may diagnose a URI by:  Physical  exam.  Tests to check that your symptoms are not due to another condition such as:  Strep throat.  Sinusitis.  Pneumonia.  Asthma. TREATMENT  A URI goes away on its own with time. It cannot be cured with medicines, but medicines may be prescribed or recommended to relieve symptoms. Medicines may help:  Reduce your fever.  Reduce your cough.  Relieve nasal congestion. HOME CARE INSTRUCTIONS   Take medicines only as directed by your health care provider.   Gargle warm saltwater or take cough drops to comfort your throat as directed by your health care provider.  Use a warm mist humidifier or inhale steam from a shower to increase air moisture. This may make it easier to breathe.  Drink enough fluid to keep your urine clear or pale yellow.   Eat soups and other clear broths and maintain good nutrition.   Rest as needed.   Return to work when your temperature has returned to normal or as your health care provider advises. You may need to stay home longer to avoid infecting others. You can also use a face mask and careful hand washing to prevent spread of the virus.  Increase the usage of your inhaler if you have asthma.   Do not use any tobacco products, including cigarettes, chewing tobacco, or electronic cigarettes. If you need help quitting, ask your health care provider. PREVENTION  The best way to protect yourself from getting a cold is to practice good hygiene.   Avoid  oral or hand contact with people with cold symptoms.   Wash your hands often if contact occurs.  There is no clear evidence that vitamin C, vitamin E, echinacea, or exercise reduces the chance of developing a cold. However, it is always recommended to get plenty of rest, exercise, and practice good nutrition.  SEEK MEDICAL CARE IF:   You are getting worse rather than better.   Your symptoms are not controlled by medicine.   You have chills.  You have worsening shortness of breath.  You  have brown or red mucus.  You have yellow or brown nasal discharge.  You have pain in your face, especially when you bend forward.  You have a fever.  You have swollen neck glands.  You have pain while swallowing.  You have white areas in the back of your throat. SEEK IMMEDIATE MEDICAL CARE IF:   You have severe or persistent:  Headache.  Ear pain.  Sinus pain.  Chest pain.  You have chronic lung disease and any of the following:  Wheezing.  Prolonged cough.  Coughing up blood.  A change in your usual mucus.  You have a stiff neck.  You have changes in your:  Vision.  Hearing.  Thinking.  Mood. MAKE SURE YOU:   Understand these instructions.  Will watch your condition.  Will get help right away if you are not doing well or get worse.   This information is not intended to replace advice given to you by your health care provider. Make sure you discuss any questions you have with your health care provider.   Document Released: 07/30/2000 Document Revised: 06/20/2014 Document Reviewed: 05/11/2013 Elsevier Interactive Patient Education [employer]

## 2015-02-07 NOTE — ED Provider Notes (Signed)
CSN: [id number]     Arrival date & time 02/07/15  1859 History   First MD Initiated Contact with Patient 02/07/15 2059     Chief Complaint  Patient presents with  . Chest Pain  . Dizziness  . Nausea     (Consider location/radiation/quality/duration/timing/severity/associated sxs/prior Treatment) HPI  Henry Hardin is a(n) 29 y.o. male who presents with c/o myalgias and productive cough. He was seen for the same on 02/02/2015, diagnosed with bronchitis, and discharged with prednisone and azithromycin. The patient states that he did not get them filled because he does not have any money until he gets paid again. He has associated nausea without vomiting. He has had some runny stools.  Denies fevers. Denies DOE, SOB, chest tightness or pressure, radiation to left arm, jaw or back, or diaphoresis. Denies dysuria, flank pain, suprapubic pain, frequency, urgency, or hematuria. Denies headaches, light headedness, weakness, visual disturbances. Denies abdominal pain, nausea, vomiting, diarrhea or constipation.   Past Medical History  Diagnosis Date  . Asthma   . Pneumonia    History reviewed. No pertinent past surgical history. No family history on file. Social History  Substance Use Topics  . Smoking status: Current Every Day Smoker -- 0.50 packs/day    Types: Cigarettes  . Smokeless tobacco: Never Used  . Alcohol Use: Yes     Comment: occasional    Review of Systems  Ten systems reviewed and are negative for acute change, except as noted in the HPI.    Allergies  Review of patient's allergies indicates no known allergies.  Home Medications   Prior to Admission medications   Medication Sig Start Date End Date Taking? Authorizing Provider  albuterol (PROAIR HFA) 108 (90 BASE) MCG/ACT inhaler Inhale 2 puffs into the lungs every 6 (six) hours as needed for wheezing or shortness of breath. 12/08/14  Yes Fayrene HelperBowie Tran, PA-C  azithromycin (ZITHROMAX) 250 MG tablet Take 1 tablet (250  mg total) by mouth daily. Take first 2 tablets together, then 1 every day until finished. 02/02/15   Derwood KaplanAnkit Nanavati, MD  benzonatate (TESSALON) 100 MG capsule Take 1 capsule (100 mg total) by mouth every 8 (eight) hours. 02/02/15   Derwood KaplanAnkit Nanavati, MD  doxycycline (VIBRAMYCIN) 100 MG capsule Take 1 capsule (100 mg total) by mouth 2 (two) times daily. Patient not taking: Reported on 11/26/2014 10/30/14   Eyvonne MechanicJeffrey Hedges, PA-C  methocarbamol (ROBAXIN) 500 MG tablet Take 1 tablet (500 mg total) by mouth 2 (two) times daily. Patient not taking: Reported on 11/26/2014 07/24/14   Catha GosselinHanna Patel-Mills, PA-C  naproxen (NAPROSYN) 500 MG tablet Take 1 tablet (500 mg total) by mouth 2 (two) times daily. Patient not taking: Reported on 08/01/2014 07/24/14   Catha GosselinHanna Patel-Mills, PA-C  ondansetron (ZOFRAN) 4 MG tablet Take 1 tablet (4 mg total) by mouth every 8 (eight) hours as needed for nausea or vomiting. Patient not taking: Reported on 11/26/2014 10/29/14   Chinita Pesterari B Triplett, FNP  oxyCODONE-acetaminophen (PERCOCET/ROXICET) 5-325 MG per tablet Take 2 tablets by mouth every 4 (four) hours as needed for severe pain. Patient not taking: Reported on 11/26/2014 08/01/14   Catha GosselinHanna Patel-Mills, PA-C  predniSONE (DELTASONE) 10 MG tablet Take 6 tablets (60 mg total) by mouth daily. 02/02/15   Derwood KaplanAnkit Nanavati, MD  pseudoephedrine (SUDAFED) 60 MG tablet Take 1 tablet (60 mg total) by mouth every 8 (eight) hours as needed for congestion. Patient not taking: Reported on 06/09/2014 02/03/14   Tomasita CrumbleAdeleke Oni, MD   BP 131/82 mmHg  Pulse 54  Temp(Src) 97.8 F (36.6 C) (Oral)  Resp 21  SpO2 91% Physical Exam  Constitutional: He appears well-developed and well-nourished. No distress.  HENT:  Head: Normocephalic and atraumatic.  Eyes: Conjunctivae are normal. No scleral icterus.  Neck: Normal range of motion. Neck supple.  Cardiovascular: Normal rate, regular rhythm and normal heart sounds.   Pulmonary/Chest: Effort normal and breath sounds  normal. No respiratory distress. He has no wheezes.  Loud productive cough.  Abdominal: Soft. There is no tenderness.  Musculoskeletal: He exhibits no edema.  Neurological: He is alert.  Skin: Skin is warm and dry. He is not diaphoretic.  Psychiatric: His behavior is normal.  Nursing note and vitals reviewed.     ED Course  Procedures (including critical care time) Labs Review Labs Reviewed  BASIC METABOLIC PANEL  CBC  URINE RAPID DRUG SCREEN, HOSP PERFORMED  I-STAT TROPOININ, ED    Imaging Review Dg Chest 2 View  02/07/2015  CLINICAL DATA:  Dizziness, nausea, chest pain EXAM: CHEST  2 VIEW COMPARISON:  02/02/2015 FINDINGS: Cardiomediastinal silhouette is stable. No acute infiltrate or pleural effusion. No pulmonary edema. Bony thorax is unremarkable. IMPRESSION: No active cardiopulmonary disease. Electronically Signed   By: Natasha Mead M.D.   On: 02/07/2015 19:22   I have personally reviewed and evaluated these images and lab results as part of my medical decision-making.   EKG Interpretation None      MDM   Final diagnoses:  URI (upper respiratory infection)    Pt CXR negative for acute infiltrate. Patients symptoms are consistent with URI, likely viral etiology. Discussed that antibiotics are not indicated for viral infections. Pt will be discharged with symptomatic treatment.  Verbalizes understanding and is agreeable with plan. Pt is hemodynamically stable & in NAD prior to dc.     Arthor Captain, PA-C 02/07/15 2301  Laurence Spates, MD 02/08/15 [phone]

## 2015-02-18 HISTORY — PX: TRACHEOSTOMY: SUR1362

## 2015-04-05 ENCOUNTER — Encounter: Payer: Self-pay | Admitting: Urgent Care

## 2015-04-05 ENCOUNTER — Emergency Department
Admission: EM | Admit: 2015-04-05 | Discharge: 2015-04-05 | Disposition: A | Discharge status: Home / Self Care | Payer: No Typology Code available for payment source | Attending: Emergency Medicine | Admitting: Emergency Medicine

## 2015-04-05 DIAGNOSIS — F17210 Nicotine dependence, cigarettes, uncomplicated: Secondary | ICD-10-CM | POA: Insufficient documentation

## 2015-04-05 DIAGNOSIS — Z79899 Other long term (current) drug therapy: Secondary | ICD-10-CM | POA: Insufficient documentation

## 2015-04-05 DIAGNOSIS — J45901 Unspecified asthma with (acute) exacerbation: Secondary | ICD-10-CM | POA: Insufficient documentation

## 2015-04-05 DIAGNOSIS — Z7952 Long term (current) use of systemic steroids: Secondary | ICD-10-CM | POA: Insufficient documentation

## 2015-04-05 MED ORDER — IPRATROPIUM-ALBUTEROL 0.5-2.5 (3) MG/3ML IN SOLN
3.0000 mL | Freq: Once | RESPIRATORY_TRACT | Status: AC
Start: 2015-04-05 — End: 2015-04-05
  Administered 2015-04-05: 3 mL via RESPIRATORY_TRACT

## 2015-04-05 MED ORDER — ALBUTEROL SULFATE HFA 108 (90 BASE) MCG/ACT IN AERS: 2.0000 | INHALATION_SPRAY | Freq: Four times a day (QID) | RESPIRATORY_TRACT | Status: DC | PRN

## 2015-04-05 MED ORDER — PREDNISONE 20 MG PO TABS: 60.0000 mg | ORAL_TABLET | Freq: Every day | ORAL | Status: AC

## 2015-04-05 MED ORDER — IPRATROPIUM-ALBUTEROL 0.5-2.5 (3) MG/3ML IN SOLN
3.0000 mL | Freq: Once | RESPIRATORY_TRACT | Status: AC
  Administered 2015-04-05: 3 mL via RESPIRATORY_TRACT

## 2015-04-05 NOTE — ED Notes (Addendum)
Patient presents with c/o difficulty breathing. (+) wheezing. PMH significant for asthma - has not used SVN or MDI PTA.

## 2015-04-05 NOTE — Discharge Instructions (Signed)
Asthma, Adult Asthma is a recurring condition in which the airways tighten and narrow. Asthma can make it difficult to breathe. It can cause coughing, wheezing, and shortness of breath. Asthma episodes, also called asthma attacks, range from minor to life-threatening. Asthma cannot be cured, but medicines and lifestyle changes can help control it. CAUSES Asthma is believed to be caused by inherited (genetic) and environmental factors, but its exact cause is unknown. Asthma may be triggered by allergens, lung infections, or irritants in the air. Asthma triggers are different for each person. Common triggers include:   Animal dander.  Dust mites.  Cockroaches.  Pollen from trees or grass.  Mold.  Smoke.  Air pollutants such as dust, household cleaners, hair sprays, aerosol sprays, paint fumes, strong chemicals, or strong odors.  Cold air, weather changes, and winds (which increase molds and pollens in the air).  Strong emotional expressions such as crying or laughing hard.  Stress.  Certain medicines (such as aspirin) or types of drugs (such as beta-blockers).  Sulfites in foods and drinks. Foods and drinks that may contain sulfites include dried fruit, potato chips, and sparkling grape juice.  Infections or inflammatory conditions such as the flu, a cold, or an inflammation of the nasal membranes (rhinitis).  Gastroesophageal reflux disease (GERD).  Exercise or strenuous activity. SYMPTOMS Symptoms may occur immediately after asthma is triggered or many hours later. Symptoms include:  Wheezing.  Excessive nighttime or early morning coughing.  Frequent or severe coughing with a common cold.  Chest tightness.  Shortness of breath. DIAGNOSIS  The diagnosis of asthma is made by a review of your medical history and a physical exam. Tests may also be performed. These may include:  Lung function studies. These tests show how much air you breathe in and out.  Allergy  tests.  Imaging tests such as X-rays. TREATMENT  Asthma cannot be cured, but it can usually be controlled. Treatment involves identifying and avoiding your asthma triggers. It also involves medicines. There are 2 classes of medicine used for asthma treatment:   Controller medicines. These prevent asthma symptoms from occurring. They are usually taken every day.  Reliever or rescue medicines. These quickly relieve asthma symptoms. They are used as needed and provide short-term relief. Your health care provider will help you create an asthma action plan. An asthma action plan is a written plan for managing and treating your asthma attacks. It includes a list of your asthma triggers and how they may be avoided. It also includes information on when medicines should be taken and when their dosage should be changed. An action plan may also involve the use of a device called a peak flow meter. A peak flow meter measures how well the lungs are working. It helps you monitor your condition. HOME CARE INSTRUCTIONS   Take medicines only as directed by your health care provider. Speak with your health care provider if you have questions about how or when to take the medicines.  Use a peak flow meter as directed by your health care provider. Record and keep track of readings.  Understand and use the action plan to help minimize or stop an asthma attack without needing to seek medical care.  Control your home environment in the following ways to help prevent asthma attacks:  Do not smoke. Avoid being exposed to secondhand smoke.  Change your heating and air conditioning filter regularly.  Limit your use of fireplaces and wood stoves.  Get rid of pests (such as roaches   and mice) and their droppings.  Throw away plants if you see mold on them.  Clean your floors and dust regularly. Use unscented cleaning products.  Try to have someone else vacuum for you regularly. Stay out of rooms while they are  being vacuumed and for a short while afterward. If you vacuum, use a dust mask from a hardware store, a double-layered or microfilter vacuum cleaner bag, or a vacuum cleaner with a HEPA filter.  Replace carpet with wood, tile, or vinyl flooring. Carpet can trap dander and dust.  Use allergy-proof pillows, mattress covers, and box spring covers.  Wash bed sheets and blankets every week in hot water and dry them in a dryer.  Use blankets that are made of polyester or cotton.  Clean bathrooms and kitchens with bleach. If possible, have someone repaint the walls in these rooms with mold-resistant paint. Keep out of the rooms that are being cleaned and painted.  Wash hands frequently. SEEK MEDICAL CARE IF:   You have wheezing, shortness of breath, or a cough even if taking medicine to prevent attacks.  The colored mucus you cough up (sputum) is thicker than usual.  Your sputum changes from clear or white to yellow, green, gray, or bloody.  You have any problems that may be related to the medicines you are taking (such as a rash, itching, swelling, or trouble breathing).  You are using a reliever medicine more than 2-3 times per week.  Your peak flow is still at 50-79% of your personal best after following your action plan for 1 hour.  You have a fever. SEEK IMMEDIATE MEDICAL CARE IF:   You seem to be getting worse and are unresponsive to treatment during an asthma attack.  You are short of breath even at rest.  You get short of breath when doing very little physical activity.  You have difficulty eating, drinking, or talking due to asthma symptoms.  You develop chest pain.  You develop a fast heartbeat.  You have a bluish color to your lips or fingernails.  You are light-headed, dizzy, or faint.  Your peak flow is less than 50% of your personal best.   This information is not intended to replace advice given to you by your health care provider. Make sure you discuss any  questions you have with your health care provider.   Document Released: 02/03/2005 Document Revised: 10/25/2014 Document Reviewed: 09/02/2012 Elsevier Interactive Patient Education 2016 [employer]  

## 2015-04-05 NOTE — ED Notes (Signed)
Duonebs x 3 completed. Dr. Manson Passey to triage 1 to see patient at this time.

## 2015-04-05 NOTE — ED Notes (Signed)
Patient reports feeling "much better" - sats 100% on RA - BBS CTA at this point. Patient pending discharge. MD asked that this RN print off coupons from [employer] for Albuterol MDI and Prednisone  PO x 5 days; coupons printed and provided to patient. Patient also made aware of medication management clinic for prescription assistance.

## 2015-04-05 NOTE — ED Notes (Signed)

## 2015-04-05 NOTE — ED Provider Notes (Signed)
[facility] Emergency Department Provider Note  ____________________________________________  Time seen: 1:55 AM  I have reviewed the triage vital signs and the nursing notes.   HISTORY  Chief Complaint Asthma     HPI Henry Hardin is a 30 y.o. male with history of asthma presents with wheezing with onset tonight patient states that he ran out of his inhaler. Patient denies any fever no nausea or vomiting or chest pain. Patient does admit to tobacco use     Past Medical History  Diagnosis Date  . Asthma   . Pneumonia     Patient Active Problem List   Diagnosis Date Noted  . ALLERGIC RHINITIS 08/02/2009  . RESPIRATORY FAILURE, ACUTE 08/02/2009  . GERD 08/02/2009  . HYPERGLYCEMIA 08/02/2009    History reviewed. No pertinent past surgical history.  Current Outpatient Rx  Name  Route  Sig  Dispense  Refill  . albuterol (PROAIR HFA) 108 (90 BASE) MCG/ACT inhaler   Inhalation   Inhale 2 puffs into the lungs every 6 (six) hours as needed for wheezing or shortness of breath.   6.7 g   1   . albuterol (PROVENTIL HFA;VENTOLIN HFA) 108 (90 Base) MCG/ACT inhaler   Inhalation   Inhale 2 puffs into the lungs every 6 (six) hours as needed for wheezing or shortness of breath.   1 Inhaler   2   . azithromycin (ZITHROMAX) 250 MG tablet   Oral   Take 1 tablet (250 mg total) by mouth daily. Take first 2 tablets together, then 1 every day until finished.   6 tablet   0   . benzonatate (TESSALON) 100 MG capsule   Oral   Take 1 capsule (100 mg total) by mouth every 8 (eight) hours.   21 capsule   0   . doxycycline (VIBRAMYCIN) 100 MG capsule   Oral   Take 1 capsule (100 mg total) by mouth 2 (two) times daily. Patient not taking: Reported on 11/26/2014   20 capsule   0   . guaiFENesin-codeine 100-10 MG/5ML syrup   Oral   Take 5-10 mLs by mouth every 6 (six) hours as needed for cough.   120 mL   0   . methocarbamol (ROBAXIN) 500 MG tablet  Oral   Take 1 tablet (500 mg total) by mouth 2 (two) times daily. Patient not taking: Reported on 11/26/2014   20 tablet   0   . naproxen (NAPROSYN) 500 MG tablet   Oral   Take 1 tablet (500 mg total) by mouth 2 (two) times daily. Patient not taking: Reported on 08/01/2014   30 tablet   0   . ondansetron (ZOFRAN) 4 MG tablet   Oral   Take 1 tablet (4 mg total) by mouth every 6 (six) hours.   12 tablet   0   . predniSONE (DELTASONE) 10 MG tablet   Oral   Take 6 tablets (60 mg total) by mouth daily.   30 tablet   0   . predniSONE (DELTASONE) 20 MG tablet   Oral   Take 3 tablets (60 mg total) by mouth daily.   15 tablet   0   . pseudoephedrine (SUDAFED) 60 MG tablet   Oral   Take 1 tablet (60 mg total) by mouth every 8 (eight) hours as needed for congestion. Patient not taking: Reported on 06/09/2014   12 tablet   0     Allergies Review of patient's allergies indicates no known allergies.  No family history on file.  Social History Social History  Substance Use Topics  . Smoking status: Current Every Day Smoker -- 0.50 packs/day    Types: Cigarettes  . Smokeless tobacco: Never Used  . Alcohol Use: Yes     Comment: occasional    Review of Systems  Constitutional: Negative for fever. Eyes: Negative for visual changes. ENT: Negative for sore throat. Cardiovascular: Negative for chest pain. Respiratory:. Positive for wheezing and shortness of breath Gastrointestinal: Negative for abdominal pain, vomiting and diarrhea. Genitourinary: Negative for dysuria. Musculoskeletal: Negative for back pain. Skin: Negative for rash. Neurological: Negative for headaches, focal weakness or numbness.   10-point ROS otherwise negative.  ____________________________________________   PHYSICAL EXAM:  VITAL SIGNS: ED Triage Vitals  Enc Vitals Group     BP 04/05/15 0123 130/71 mmHg     Pulse Rate 04/05/15 0123 62     Resp --      Temp 04/05/15 0123 98 F (36.7 C)      Temp Source 04/05/15 0123 Oral     SpO2 04/05/15 0123 98 %     Weight --      Height --      Head Cir --      Peak Flow --      Pain Score 04/05/15 0141 0     Pain Loc --      Pain Edu? --      Excl. in GC? --      Constitutional: Alert and oriented. Well appearing and in no distress. Eyes: Conjunctivae are normal. PERRL. Normal extraocular movements. ENT   Head: Normocephalic and atraumatic.   Nose: No congestion/rhinnorhea.   Mouth/Throat: Mucous membranes are moist.   Neck: No stridor. Hematological/Lymphatic/Immunilogical: No cervical lymphadenopathy. Cardiovascular: Normal rate, regular rhythm. Normal and symmetric distal pulses are present in all extremities. No murmurs, rubs, or gallops. Respiratory: Normal respiratory effort without tachypnea nor retractions. Breath sounds are clear and equal bilaterally. Moderate external lesions noted  Gastrointestinal: Soft and nontender. No distention. There is no CVA tenderness. Genitourinary: deferred Musculoskeletal: Nontender with normal range of motion in all extremities. No joint effusions.  No lower extremity tenderness nor edema. Neurologic:  Normal speech and language. No gross focal neurologic deficits are appreciated. Speech is normal.  Skin:  Skin is warm, dry and intact. No rash noted. Psychiatric: Mood and affect are normal. Speech and behavior are normal. Patient exhibits appropriate insight and judgment.     INITIAL IMPRESSION / ASSESSMENT AND PLAN / ED COURSE  Pertinent labs & imaging results that were available during my care of the patient were reviewed by me and considered in my medical decision making (see chart for details).  Patient received nebulized albuterol  ____________________________________________   FINAL CLINICAL IMPRESSION(S) / ED DIAGNOSES  Final diagnoses:  Asthma exacerbation      Darci Current, MD 04/05/15 [phone]

## 2015-04-07 ENCOUNTER — Emergency Department: Payer: No Typology Code available for payment source

## 2015-04-07 ENCOUNTER — Emergency Department
Admission: EM | Admit: 2015-04-07 | Discharge: 2015-04-07 | Disposition: A | Discharge status: Home / Self Care | Payer: No Typology Code available for payment source | Attending: Emergency Medicine | Admitting: Emergency Medicine

## 2015-04-07 ENCOUNTER — Encounter: Payer: Self-pay | Admitting: Emergency Medicine

## 2015-04-07 DIAGNOSIS — F17210 Nicotine dependence, cigarettes, uncomplicated: Secondary | ICD-10-CM | POA: Insufficient documentation

## 2015-04-07 DIAGNOSIS — R079 Chest pain, unspecified: Secondary | ICD-10-CM | POA: Insufficient documentation

## 2015-04-07 DIAGNOSIS — F410 Panic disorder [episodic paroxysmal anxiety] without agoraphobia: Secondary | ICD-10-CM

## 2015-04-07 LAB — CBC
HCT: 42.7 % (ref 40.0–52.0)
Hemoglobin: 14.3 g/dL (ref 13.0–18.0)
MCH: 29.2 pg (ref 26.0–34.0)
MCHC: 33.5 g/dL (ref 32.0–36.0)
MCV: 87 fL (ref 80.0–100.0)
PLATELETS: 241 10*3/uL (ref 150–440)
RBC: 4.92 MIL/uL (ref 4.40–5.90)
RDW: 14 % (ref 11.5–14.5)
WBC: 4.6 10*3/uL (ref 3.8–10.6)

## 2015-04-07 LAB — BASIC METABOLIC PANEL
Anion gap: 5 (ref 5–15)
BUN: 14 mg/dL (ref 6–20)
CHLORIDE: 104 mmol/L (ref 101–111)
CO2: 30 mmol/L (ref 22–32)
CREATININE: 1.11 mg/dL (ref 0.61–1.24)
Calcium: 8.8 mg/dL — ABNORMAL LOW (ref 8.9–10.3)
Glucose, Bld: 125 mg/dL — ABNORMAL HIGH (ref 65–99)
POTASSIUM: 3.7 mmol/L (ref 3.5–5.1)
SODIUM: 139 mmol/L (ref 135–145)

## 2015-04-07 LAB — LIPASE, BLOOD: LIPASE: 31 U/L (ref 11–51)

## 2015-04-07 LAB — TROPONIN I: Troponin I: 0.03 ng/mL (ref <0.031)

## 2015-04-07 MED ORDER — DIAZEPAM 5 MG PO TABS: 5.0000 mg | ORAL_TABLET | Freq: Three times a day (TID) | ORAL | Status: DC | PRN

## 2015-04-07 MED ORDER — MORPHINE SULFATE (PF) 4 MG/ML IV SOLN
4.0000 mg | Freq: Once | INTRAVENOUS | Status: AC
  Administered 2015-04-07: 4 mg via INTRAVENOUS
  Filled 2015-04-07: qty 1

## 2015-04-07 MED ORDER — KETOROLAC TROMETHAMINE 30 MG/ML IJ SOLN
30.0000 mg | Freq: Once | INTRAMUSCULAR | Status: AC
  Administered 2015-04-07: 30 mg via INTRAVENOUS
  Filled 2015-04-07: qty 1

## 2015-04-07 MED ORDER — DIAZEPAM 5 MG PO TABS
5.0000 mg | ORAL_TABLET | Freq: Once | ORAL | Status: AC
  Administered 2015-04-07: 5 mg via ORAL
  Filled 2015-04-07: qty 1

## 2015-04-07 NOTE — Discharge Instructions (Signed)
Nonspecific Chest Pain  °Chest pain can be caused by many different conditions. There is always a chance that your pain could be related to something serious, such as a heart attack or a blood clot in your lungs. Chest pain can also be caused by conditions that are not life-threatening. If you have chest pain, it is very important to follow up with your health care provider. °CAUSES  °Chest pain can be caused by: °· Heartburn. °· Pneumonia or bronchitis. °· Anxiety or stress. °· Inflammation around your heart (pericarditis) or lung (pleuritis or pleurisy). °· A blood clot in your lung. °· A collapsed lung (pneumothorax). It can develop suddenly on its own (spontaneous pneumothorax) or from trauma to the chest. °· Shingles infection (varicella-zoster virus). °· Heart attack. °· Damage to the bones, muscles, and cartilage that make up your chest wall. This can include: °· Bruised bones due to injury. °· Strained muscles or cartilage due to frequent or repeated coughing or overwork. °· Fracture to one or more ribs. °· Sore cartilage due to inflammation (costochondritis). °RISK FACTORS  °Risk factors for chest pain may include: °· Activities that increase your risk for trauma or injury to your chest. °· Respiratory infections or conditions that cause frequent coughing. °· Medical conditions or overeating that can cause heartburn. °· Heart disease or family history of heart disease. °· Conditions or health behaviors that increase your risk of developing a blood clot. °· Having had chicken pox (varicella zoster). °SIGNS AND SYMPTOMS °Chest pain can feel like: °· Burning or tingling on the surface of your chest or deep in your chest. °· Crushing, pressure, aching, or squeezing pain. °· Dull or sharp pain that is worse when you move, cough, or take a deep breath. °· Pain that is also felt in your back, neck, shoulder, or arm, or pain that spreads to any of these areas. °Your chest pain may come and go, or it may stay  constant. °DIAGNOSIS °Lab tests or other studies may be needed to find the cause of your pain. Your health care provider may have you take a test called an ambulatory ECG (electrocardiogram). An ECG records your heartbeat patterns at the time the test is performed. You may also have other tests, such as: °· Transthoracic echocardiogram (TTE). During echocardiography, sound waves are used to create a picture of all of the heart structures and to look at how blood flows through your heart. °· Transesophageal echocardiogram (TEE). This is a more advanced imaging test that obtains images from inside your body. It allows your health care provider to see your heart in finer detail. °· Cardiac monitoring. This allows your health care provider to monitor your heart rate and rhythm in real time. °· Holter monitor. This is a portable device that records your heartbeat and can help to diagnose abnormal heartbeats. It allows your health care provider to track your heart activity for several days, if needed. °· Stress tests. These can be done through exercise or by taking medicine that makes your heart beat more quickly. °· Blood tests. °· Imaging tests. °TREATMENT  °Your treatment depends on what is causing your chest pain. Treatment may include: °· Medicines. These may include: °· Acid blockers for heartburn. °· Anti-inflammatory medicine. °· Pain medicine for inflammatory conditions. °· Antibiotic medicine, if an infection is present. °· Medicines to dissolve blood clots. °· Medicines to treat coronary artery disease. °· Supportive care for conditions that do not require medicines. This may include: °· Resting. °· Applying heat   or cold packs to injured areas. °· Limiting activities until pain decreases. °HOME CARE INSTRUCTIONS °· If you were prescribed an antibiotic medicine, finish it all even if you start to feel better. °· Avoid any activities that bring on chest pain. °· Do not use any tobacco products, including  cigarettes, chewing tobacco, or electronic cigarettes. If you need help quitting, ask your health care provider. °· Do not drink alcohol. °· Take medicines only as directed by your health care provider. °· Keep all follow-up visits as directed by your health care provider. This is important. This includes any further testing if your chest pain does not go away. °· If heartburn is the cause for your chest pain, you may be told to keep your head raised (elevated) while sleeping. This reduces the chance that acid will go from your stomach into your esophagus. °· Make lifestyle changes as directed by your health care provider. These may include: °· Getting regular exercise. Ask your health care provider to suggest some activities that are safe for you. °· Eating a heart-healthy diet. A registered dietitian can help you to learn healthy eating options. °· Maintaining a healthy weight. °· Managing diabetes, if necessary. °· Reducing stress. °SEEK MEDICAL CARE IF: °· Your chest pain does not go away after treatment. °· You have a rash with blisters on your chest. °· You have a fever. °SEEK IMMEDIATE MEDICAL CARE IF:  °· Your chest pain is worse. °· You have an increasing cough, or you cough up blood. °· You have severe abdominal pain. °· You have severe weakness. °· You faint. °· You have chills. °· You have sudden, unexplained chest discomfort. °· You have sudden, unexplained discomfort in your arms, back, neck, or jaw. °· You have shortness of breath at any time. °· You suddenly start to sweat, or your skin gets clammy. °· You feel nauseous or you vomit. °· You suddenly feel light-headed or dizzy. °· Your heart begins to beat quickly, or it feels like it is skipping beats. °These symptoms may represent a serious problem that is an emergency. Do not wait to see if the symptoms will go away. Get medical help right away. Call your local emergency services (911 in the [place] Do not drive yourself to the hospital. °  °This  information is not intended to replace advice given to you by your health care provider. Make sure you discuss any questions you have with your health care provider. °  °Document Released: 11/13/2004 Document Revised: 02/24/2014 Document Reviewed: 09/09/2013 °Elsevier Interactive Patient Education ©2016 [employer] ° °Panic Attacks °Panic attacks are sudden, short-lived surges of severe anxiety, fear, or discomfort. They may occur for no reason when you are relaxed, when you are anxious, or when you are sleeping. Panic attacks may occur for a number of reasons:  °· Healthy people occasionally have panic attacks in extreme, life-threatening situations, such as war or natural disasters. Normal anxiety is a protective mechanism of the body that helps us react to danger (fight or flight response). °· Panic attacks are often seen with anxiety disorders, such as panic disorder, social anxiety disorder, generalized anxiety disorder, and phobias. Anxiety disorders cause excessive or uncontrollable anxiety. They may interfere with your relationships or other life activities. °· Panic attacks are sometimes seen with other mental illnesses, such as depression and posttraumatic stress disorder. °· Certain medical conditions, prescription medicines, and drugs of abuse can cause panic attacks. °SYMPTOMS  °Panic attacks start suddenly, peak within 20 minutes, and are   accompanied by four or more of the following symptoms: °· Pounding heart or fast heart rate (palpitations). °· Sweating. °· Trembling or shaking. °· Shortness of breath or feeling smothered. °· Feeling choked. °· Chest pain or discomfort. °· Nausea or strange feeling in your stomach. °· Dizziness, light-headedness, or feeling like you will faint. °· Chills or hot flushes. °· Numbness or tingling in your lips or hands and feet. °· Feeling that things are not real or feeling that you are not yourself. °· Fear of losing control or going crazy. °· Fear of dying. °Some  of these symptoms can mimic serious medical conditions. For example, you may think you are having a heart attack. Although panic attacks can be very scary, they are not life threatening. °DIAGNOSIS  °Panic attacks are diagnosed through an assessment by your health care provider. Your health care provider will ask questions about your symptoms, such as where and when they occurred. Your health care provider will also ask about your medical history and use of alcohol and drugs, including prescription medicines. Your health care provider may order blood tests or other studies to rule out a serious medical condition. Your health care provider may refer you to a mental health professional for further evaluation. °TREATMENT  °· Most healthy people who have one or two panic attacks in an extreme, life-threatening situation will not require treatment. °· The treatment for panic attacks associated with anxiety disorders or other mental illness typically involves counseling with a mental health professional, medicine, or a combination of both. Your health care provider will help determine what treatment is best for you. °· Panic attacks due to physical illness usually go away with treatment of the illness. If prescription medicine is causing panic attacks, talk with your health care provider about stopping the medicine, decreasing the dose, or substituting another medicine. °· Panic attacks due to alcohol or drug abuse go away with abstinence. Some adults need professional help in order to stop drinking or using drugs. °HOME CARE INSTRUCTIONS  °· Take all medicines as directed by your health care provider.   °· Schedule and attend follow-up visits as directed by your health care provider. It is important to keep all your appointments. °SEEK MEDICAL CARE IF: °· You are not able to take your medicines as prescribed. °· Your symptoms do not improve or get worse. °SEEK IMMEDIATE MEDICAL CARE IF:  °· You experience panic attack  symptoms that are different than your usual symptoms. °· You have serious thoughts about hurting yourself or others. °· You are taking medicine for panic attacks and have a serious side effect. °MAKE SURE YOU: °· Understand these instructions. °· Will watch your condition. °· Will get help right away if you are not doing well or get worse. °  °This information is not intended to replace advice given to you by your health care provider. Make sure you discuss any questions you have with your health care provider. °  °Document Released: 02/03/2005 Document Revised: 02/08/2013 Document Reviewed: 09/17/2012 °Elsevier Interactive Patient Education ©2016 [employer] ° °

## 2015-04-07 NOTE — ED Provider Notes (Signed)
[facility] Emergency Department Provider Note     Time seen: ----------------------------------------- 10:59 AM on 04/07/2015 -----------------------------------------    I have reviewed the triage vital signs and the nursing notes.   HISTORY  Chief Complaint No chief complaint on file.    HPI Henry Hardin is a 30 y.o. male who presents to ER with midsternal chest pain that started this morning. Patient denies any recent heavy physical activity, denies any fevers, chills, shortness of breath, nausea vomiting or diarrhea.Patient denies history of this before.   Past Medical History  Diagnosis Date  . Asthma   . Pneumonia     Patient Active Problem List   Diagnosis Date Noted  . ALLERGIC RHINITIS 08/02/2009  . RESPIRATORY FAILURE, ACUTE 08/02/2009  . GERD 08/02/2009  . HYPERGLYCEMIA 08/02/2009    No past surgical history on file.  Allergies Review of patient's allergies indicates no known allergies.  Social History Social History  Substance Use Topics  . Smoking status: Current Every Day Smoker -- 0.50 packs/day    Types: Cigarettes  . Smokeless tobacco: Never Used  . Alcohol Use: Yes     Comment: occasional    Review of Systems Constitutional: Negative for fever. Eyes: Negative for visual changes. ENT: Negative for sore throat. Cardiovascular: Positive for chest pain Respiratory: Negative for shortness of breath. Gastrointestinal: Negative for abdominal pain, vomiting and diarrhea. Genitourinary: Negative for dysuria. Musculoskeletal: Negative for back pain. Skin: Negative for rash. Neurological: Negative for headaches, focal weakness or numbness.  10-point ROS otherwise negative.  ____________________________________________   PHYSICAL EXAM:  VITAL SIGNS: ED Triage Vitals  Enc Vitals Group     BP --      Pulse --      Resp --      Temp --      Temp src --      SpO2 --      Weight --      Height --      Head  Cir --      Peak Flow --      Pain Score --      Pain Loc --      Pain Edu? --      Excl. in GC? --     Constitutional: Alert and oriented. Well appearing and in no distress. Eyes: Conjunctivae are normal. PERRL. Normal extraocular movements. ENT   Head: Normocephalic and atraumatic.   Nose: No congestion/rhinnorhea.   Mouth/Throat: Mucous membranes are moist.   Neck: No stridor. Cardiovascular: Normal rate, regular rhythm. Normal and symmetric distal pulses are present in all extremities. No murmurs, rubs, or gallops. Respiratory: Normal respiratory effort without tachypnea nor retractions. Breath sounds are clear and equal bilaterally. No wheezes/rales/rhonchi. Gastrointestinal: Soft and nontender. No distention. No abdominal bruits.  Musculoskeletal: Nontender with normal range of motion in all extremities. No joint effusions.  No lower extremity tenderness nor edema. Neurologic:  Normal speech and language. No gross focal neurologic deficits are appreciated. Speech is normal. No gait instability. Skin:  Skin is warm, dry and intact. No rash noted. Psychiatric: Mood and affect are normal. Speech and behavior are normal. Patient exhibits appropriate insight and judgment. ____________________________________________  EKG: Interpreted by me. Normal sinus rhythm with a rate of 53 bpm, normal. Normal, normal QRS, normal QT interval. Normal axis. There are nonspecific ST elevation consistent with prior EKG.  ____________________________________________  ED COURSE:  Pertinent labs & imaging results that were available during my care of the patient  were reviewed by me and considered in my medical decision making (see chart for details). Patient is in no acute distress, unclear etiology. I'll check cardiac labs and chest x-ray. ____________________________________________    LABS (pertinent positives/negatives)  Labs Reviewed  BASIC METABOLIC PANEL - Abnormal; Notable for  the following:    Glucose, Bld 125 (*)    Calcium 8.8 (*)    All other components within normal limits  CBC  TROPONIN I  LIPASE, BLOOD    RADIOLOGY  Chest x-ray Is unremarkable ____________________________________________  FINAL ASSESSMENT AND PLAN  Chest pain  Plan: Patient with labs and imaging as dictated above. Unclear etiology for his chest pain. Vitals and lab work are unremarkable. Symptoms seem stress-related and anxiety or panic attack related. He was given Toradol as well as Valium. He'll be discharged with Valium and referred to [facility] for follow-up.   Emily Filbert, MD   Emily Filbert, MD 04/07/15 [phone]

## 2015-04-07 NOTE — ED Notes (Signed)
Place on prednisone 2 days ago for "breathing funny" states unable to cough anything up. nonfebrile

## 2015-04-12 ENCOUNTER — Ambulatory Visit: Payer: Self-pay

## 2015-07-04 ENCOUNTER — Emergency Department (HOSPITAL_COMMUNITY)
Admission: EM | Admit: 2015-07-04 | Discharge: 2015-07-04 | Disposition: A | Discharge status: Home / Self Care | Payer: No Typology Code available for payment source | Attending: Dermatology | Admitting: Dermatology

## 2015-07-04 DIAGNOSIS — R220 Localized swelling, mass and lump, head: Secondary | ICD-10-CM | POA: Insufficient documentation

## 2015-07-04 DIAGNOSIS — F17210 Nicotine dependence, cigarettes, uncomplicated: Secondary | ICD-10-CM | POA: Insufficient documentation

## 2015-07-04 DIAGNOSIS — Z5321 Procedure and treatment not carried out due to patient leaving prior to being seen by health care provider: Secondary | ICD-10-CM | POA: Insufficient documentation

## 2015-07-04 NOTE — ED Notes (Signed)
Patient presents for lower mouth swelling x1 day. No relief with tylenol or ibuprofen. No drainage, no N/V.  122/82, 86hr, 18resp, 96%ra.

## 2015-07-04 NOTE — ED Notes (Signed)
PT CALLED X3 TO BE TRAIGED WITH NO RESPONSE. RN NOTIFIED

## 2015-07-04 NOTE — ED Notes (Signed)
PT CALLED TO BE TRIAGED WITH NO RESPONSE.  RN NOTIFIED. 

## 2015-07-04 NOTE — ED Notes (Signed)
PT CALLED TO BE TRIAGED WITH NO RESPONSE.  RN NOTIFIED.

## 2015-07-05 ENCOUNTER — Ambulatory Visit (HOSPITAL_COMMUNITY)
Admission: EM | Admit: 2015-07-05 | Discharge: 2015-07-05 | Disposition: A | Discharge status: Home / Self Care | Payer: Self-pay | Attending: Emergency Medicine | Admitting: Emergency Medicine

## 2015-07-05 DIAGNOSIS — K122 Cellulitis and abscess of mouth: Secondary | ICD-10-CM

## 2015-07-05 MED ORDER — HYDROCODONE-ACETAMINOPHEN 5-325 MG PO TABS: 1.0000 | ORAL_TABLET | Freq: Four times a day (QID) | ORAL | Status: DC | PRN

## 2015-07-05 MED ORDER — CLINDAMYCIN HCL 300 MG PO CAPS: 300.0000 mg | ORAL_CAPSULE | Freq: Three times a day (TID) | ORAL | Status: DC

## 2015-07-05 NOTE — Discharge Instructions (Signed)
The abscess is not amenable to drainage. Take clindamycin 3 times a day for 10 days. Due saltwater rinses. Apply warm compresses. Use the hydrocodone every 4-6 hours as needed for pain. Do not drive while taking this medicine. You should see improvement in the next 2 days. Follow-up as needed.

## 2015-07-05 NOTE — ED Notes (Signed)
Patient has swelling to right side of face.  Denies tooth pain.  Reports swelling and firm knot in cheek.  Noticed this yesterday.

## 2015-07-05 NOTE — ED Provider Notes (Signed)
CSN: [id number]     Arrival date & time 07/05/15  1830 History   First MD Initiated Contact with Patient 07/05/15 1841     Chief Complaint  Patient presents with  . Facial Swelling   (Consider location/radiation/quality/duration/timing/severity/associated sxs/prior Treatment) HPI  He is a 30 year old man here for evaluation of right lower jaw swelling. He states this started yesterday and has been getting worse. He reports the cyst in the right lower jaw. It is quite painful. The pain radiates throughout the teeth in the right lower jaw as well as up to his cheek. He reports subjective fevers at night. No nausea or vomiting. No drainage. He has been taking Tylenol and ibuprofen without improvement.  Past Medical History  Diagnosis Date  . Asthma   . Pneumonia    No past surgical history on file. No family history on file. Social History  Substance Use Topics  . Smoking status: Current Every Day Smoker -- 1.00 packs/day    Types: Cigarettes  . Smokeless tobacco: Never Used  . Alcohol Use: Yes     Comment: occasional    Review of Systems As in history of present illness Allergies  Review of patient's allergies indicates no known allergies.  Home Medications   Prior to Admission medications   Medication Sig Start Date End Date Taking? Authorizing Provider  albuterol (PROAIR HFA) 108 (90 BASE) MCG/ACT inhaler Inhale 2 puffs into the lungs every 6 (six) hours as needed for wheezing or shortness of breath. 12/08/14   Fayrene HelperBowie Tran, PA-C  albuterol (PROVENTIL HFA;VENTOLIN HFA) 108 (90 Base) MCG/ACT inhaler Inhale 2 puffs into the lungs every 6 (six) hours as needed for wheezing or shortness of breath. 04/05/15   Darci Currentandolph N Brown, MD  clindamycin (CLEOCIN) 300 MG capsule Take 1 capsule (300 mg total) by mouth 3 (three) times daily. 07/05/15   Charm RingsErin J Keevon Henney, MD  diazepam (VALIUM) 5 MG tablet Take 1 tablet (5 mg total) by mouth every 8 (eight) hours as needed for anxiety. 04/07/15   Emily FilbertJonathan E  Williams, MD  guaiFENesin-codeine 100-10 MG/5ML syrup Take 5-10 mLs by mouth every 6 (six) hours as needed for cough. 02/07/15   Arthor CaptainAbigail Harris, PA-C  HYDROcodone-acetaminophen (NORCO) 5-325 MG tablet Take 1 tablet by mouth every 6 (six) hours as needed for moderate pain. 07/05/15   Charm RingsErin J Ajeet Casasola, MD   Meds Ordered and Administered this Visit  Medications - No data to display  BP 135/80 mmHg  Pulse 76  Temp(Src) 98.3 F (36.8 C) (Oral)  Resp 16  SpO2 97% No data found.   Physical Exam  Constitutional: He is oriented to person, place, and time. He appears well-developed and well-nourished. No distress.  HENT:  Mouth/Throat: No oropharyngeal exudate.  He has swelling and tenderness of the right lower jaw. There is a tender nodule in that area. No appreciable fluctuance. He is also tender to percussion of multiple teeth in the right lower jaw. No gingival erythema or swelling.  Cardiovascular: Normal rate.   Pulmonary/Chest: Effort normal.  Neurological: He is alert and oriented to person, place, and time.    ED Course  Procedures (including critical care time)  Labs Review Labs Reviewed - No data to display  Imaging Review No results found.   MDM   1. Abscess of oral tissue    We'll treat with clindamycin. I suspect this is more of a soft tissue infection rather than a dental infection. Hydrocodone for pain. Follow-up as needed.  Charm Rings, MD 07/05/15 1910

## 2015-07-06 ENCOUNTER — Encounter (HOSPITAL_COMMUNITY): Payer: Self-pay | Admitting: Emergency Medicine

## 2015-07-17 ENCOUNTER — Ambulatory Visit: Payer: Self-pay

## 2015-09-20 ENCOUNTER — Emergency Department (HOSPITAL_COMMUNITY)
Admission: EM | Admit: 2015-09-20 | Discharge: 2015-09-20 | Disposition: A | Discharge status: Home / Self Care | Payer: Medicaid Other | Attending: Emergency Medicine | Admitting: Emergency Medicine

## 2015-09-20 ENCOUNTER — Encounter (HOSPITAL_COMMUNITY): Payer: Self-pay | Admitting: Emergency Medicine

## 2015-09-20 DIAGNOSIS — F17210 Nicotine dependence, cigarettes, uncomplicated: Secondary | ICD-10-CM | POA: Insufficient documentation

## 2015-09-20 DIAGNOSIS — J45901 Unspecified asthma with (acute) exacerbation: Secondary | ICD-10-CM | POA: Insufficient documentation

## 2015-09-20 DIAGNOSIS — R51 Headache: Secondary | ICD-10-CM | POA: Insufficient documentation

## 2015-09-20 MED ORDER — ALBUTEROL (5 MG/ML) CONTINUOUS INHALATION SOLN
15.0000 mg/h | INHALATION_SOLUTION | RESPIRATORY_TRACT | Status: DC
  Administered 2015-09-20: 15 mg/h via RESPIRATORY_TRACT
  Filled 2015-09-20: qty 20

## 2015-09-20 MED ORDER — ALBUTEROL SULFATE HFA 108 (90 BASE) MCG/ACT IN AERS
2.0000 | INHALATION_SPRAY | RESPIRATORY_TRACT | Status: DC | PRN
  Filled 2015-09-20: qty 6.7

## 2015-09-20 MED ORDER — PREDNISONE 20 MG PO TABS: ORAL_TABLET | ORAL | 0 refills | Status: DC

## 2015-09-20 NOTE — ED Triage Notes (Signed)
Pt brought in by EMS.  He has called EMS three times today and refused hospital treatment x2.  Given breathing treatments on both EMS calls earlier.  On route, now, he received duo-neb and 125 mg of solumedrol. Since 7 pm a total of 10 mg albuterol and 1 mg of Atrovent given.   99% on neb.

## 2015-09-20 NOTE — ED Notes (Signed)
Bed: WA04 Expected date:  Expected time:  Means of arrival:  Comments: asthma

## 2015-09-20 NOTE — ED Provider Notes (Signed)
[facility]-EMERGENCY DEPT Provider Note   CSN: [id number] Arrival date & time: 09/20/15  0041  First Provider Contact:  First MD Initiated Contact with Patient 09/20/15 0058     By signing my name below, I, Vista Mink, attest that this documentation has been prepared under the direction and in the presence of Earley Favor NP  Electronically Signed: Vista Mink, ED Scribe. 09/20/15. 1:14 AM.  History   Chief Complaint Chief Complaint  Patient presents with  . Asthma    HPI HPI Comments: Henry Hardin is a 30 y.o. male brought in by ambulance, who presents to the Emergency Department for acute asthma flare up that has been persistent since this morning. Pt has called EMS three times today for intermittent shortness of breath but refused hospital treatment twice. Per EMS, pt was given breathing treatments on both EMS calls earlier today. En route now, pt received duo-neb and  solumedrol. Total of  albuterol and  Atrovent given since 1900 today. Pt states he does not have a current albuterol inhaler; ran out one week ago. Pt further reports previous hospitalization in 2011 due to asthma; states he was intubated. Pt also states he has been on steroids such as Prednisone in the past. 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 Pt also currently complains of headache to the temples. Pt denies any visual changes. Pt states he does not have a current PCP.   The history is provided by the patient. No language interpreter was used.    Past Medical History:  Diagnosis Date  . Asthma   . Pneumonia     Patient Active Problem List   Diagnosis Date Noted  . ALLERGIC RHINITIS 08/02/2009  . RESPIRATORY FAILURE, ACUTE 08/02/2009  . GERD 08/02/2009  . HYPERGLYCEMIA 08/02/2009    History reviewed. No pertinent surgical history.     Home Medications    Prior to Admission medications   Medication Sig Start Date End Date Taking?  Authorizing Provider  albuterol (PROVENTIL HFA;VENTOLIN HFA) 108 (90 Base) MCG/ACT inhaler Inhale 2 puffs into the lungs every 6 (six) hours as needed for wheezing or shortness of breath. 04/05/15  Yes Darci Current, MD  albuterol (PROAIR HFA) 108 (90 BASE) MCG/ACT inhaler Inhale 2 puffs into the lungs every 6 (six) hours as needed for wheezing or shortness of breath. Patient not taking: Reported on 09/20/2015 12/08/14   Fayrene Helper, PA-C  clindamycin (CLEOCIN) 300 MG capsule Take 1 capsule (300 mg total) by mouth 3 (three) times daily. Patient not taking: Reported on 09/20/2015 07/05/15   Charm Rings, MD  diazepam (VALIUM) 5 MG tablet Take 1 tablet (5 mg total) by mouth every 8 (eight) hours as needed for anxiety. Patient not taking: Reported on 09/20/2015 04/07/15   Emily Filbert, MD  guaiFENesin-codeine 100-10 MG/5ML syrup Take 5-10 mLs by mouth every 6 (six) hours as needed for cough. Patient not taking: Reported on 09/20/2015 02/07/15   Arthor Captain, PA-C  HYDROcodone-acetaminophen (NORCO) 5-325 MG tablet Take 1 tablet by mouth every 6 (six) hours as needed for moderate pain. Patient not taking: Reported on 09/20/2015 07/05/15   Charm Rings, MD    Family History No family history on file.  Social History Social History  Substance Use Topics  . Smoking status: Current Every Day Smoker    Packs/day: 1.00    Types: Cigarettes  . Smokeless tobacco: Never Used  . Alcohol use Yes  Comment: occasional     Allergies   Review of patient's allergies indicates no known allergies.   Review of Systems Review of Systems  Eyes: Negative for visual disturbance.  Respiratory: Positive for shortness of breath.   Neurological: Positive for headaches (temples).  All other systems reviewed and are negative.    Physical Exam Updated Vital Signs BP 124/69 (BP Location: Left Arm)   Pulse 63   Temp 98.7 F (37.1 C) (Oral)   Resp 18   SpO2 100%   Physical Exam  Constitutional: He is  oriented to person, place, and time. He appears well-developed and well-nourished. No distress.  HENT:  Head: Normocephalic and atraumatic.  Neck: Normal range of motion.  Cardiovascular: Normal rate and regular rhythm.   Pulmonary/Chest: Effort normal. No respiratory distress. He has wheezes. He has no rales.  Abdominal: Soft. Bowel sounds are normal.  Neurological: He is alert and oriented to person, place, and time.  Skin: Skin is warm and dry. He is not diaphoretic.  Psychiatric: He has a normal mood and affect. Judgment normal.  Nursing note and vitals reviewed.    ED Treatments / Results  DIAGNOSTIC STUDIES: Oxygen Saturation is 100% on NRB, normal by my interpretation.  COORDINATION OF CARE: 1:08 AM-Will order Albuterol inhaler. Discussed treatment plan with pt at bedside and pt agreed to plan.   Labs (all labs ordered are listed, but only abnormal results are displayed) Labs Reviewed - No data to display  EKG  EKG Interpretation None       Radiology No results found.  Procedures Procedures (including critical care time)  Medications Ordered in ED Medications - No data to display   Initial Impression / Assessment and Plan / ED Course  I have reviewed the triage vital signs and the nursing notes.  Pertinent labs & imaging results that were available during my care of the patient were reviewed by me and considered in my medical decision making (see chart for details).  Clinical Course   Patient has had multiple doses of albuterol throughout the day by EMS in ED had 2 treatment and continous IV steroids  Reassessment  Clear resting  Will DC home with inhaler and Rx for Prednisone    Final Clinical Impressions(s) / ED Diagnoses   Final diagnoses:  None    New Prescriptions New Prescriptions   No medications on file   I personally performed the services described in this documentation, which was scribed in my presence. The recorded information has  been reviewed and is accurate.    Earley Favor, NP 09/20/15 8756    Shon Baton, MD 09/20/15 [phone]

## 2015-09-20 NOTE — Discharge Instructions (Signed)
You have been given an inhaler to use as well as a referral to [facility] so you can establish primary care

## 2015-10-04 ENCOUNTER — Emergency Department (HOSPITAL_COMMUNITY)
Admission: EM | Admit: 2015-10-04 | Discharge: 2015-10-04 | Disposition: A | Discharge status: Home / Self Care | Payer: Medicaid Other | Attending: Emergency Medicine | Admitting: Emergency Medicine

## 2015-10-04 ENCOUNTER — Encounter (HOSPITAL_COMMUNITY): Payer: Self-pay | Admitting: Emergency Medicine

## 2015-10-04 DIAGNOSIS — T7840XA Allergy, unspecified, initial encounter: Secondary | ICD-10-CM

## 2015-10-04 DIAGNOSIS — Z7982 Long term (current) use of aspirin: Secondary | ICD-10-CM | POA: Insufficient documentation

## 2015-10-04 DIAGNOSIS — J45909 Unspecified asthma, uncomplicated: Secondary | ICD-10-CM | POA: Insufficient documentation

## 2015-10-04 DIAGNOSIS — T63461A Toxic effect of venom of wasps, accidental (unintentional), initial encounter: Secondary | ICD-10-CM | POA: Insufficient documentation

## 2015-10-04 DIAGNOSIS — F17210 Nicotine dependence, cigarettes, uncomplicated: Secondary | ICD-10-CM | POA: Insufficient documentation

## 2015-10-04 DIAGNOSIS — T782XXA Anaphylactic shock, unspecified, initial encounter: Secondary | ICD-10-CM

## 2015-10-04 LAB — CBC WITH DIFFERENTIAL/PLATELET
Basophils Absolute: 0 10*3/uL (ref 0.0–0.1)
Basophils Relative: 0 %
EOS ABS: 0 10*3/uL (ref 0.0–0.7)
EOS PCT: 1 %
HCT: 49.8 % (ref 39.0–52.0)
HEMOGLOBIN: 16.9 g/dL (ref 13.0–17.0)
LYMPHS ABS: 2.4 10*3/uL (ref 0.7–4.0)
LYMPHS PCT: 54 %
MCH: 30.3 pg (ref 26.0–34.0)
MCHC: 33.9 g/dL (ref 30.0–36.0)
MCV: 89.2 fL (ref 78.0–100.0)
MONOS PCT: 5 %
Monocytes Absolute: 0.2 10*3/uL (ref 0.1–1.0)
NEUTROS PCT: 40 %
Neutro Abs: 1.7 10*3/uL (ref 1.7–7.7)
Platelets: 301 10*3/uL (ref 150–400)
RBC: 5.58 MIL/uL (ref 4.22–5.81)
RDW: 13.2 % (ref 11.5–15.5)
WBC: 4.3 10*3/uL (ref 4.0–10.5)

## 2015-10-04 LAB — BASIC METABOLIC PANEL
Anion gap: 9 (ref 5–15)
BUN: 7 mg/dL (ref 6–20)
CHLORIDE: 107 mmol/L (ref 101–111)
CO2: 24 mmol/L (ref 22–32)
CREATININE: 1.36 mg/dL — AB (ref 0.61–1.24)
Calcium: 9.3 mg/dL (ref 8.9–10.3)
GFR calc Af Amer: >60 mL/min (ref >60)
GFR calc non Af Amer: >60 mL/min (ref >60)
GLUCOSE: 158 mg/dL — AB (ref 65–99)
Potassium: 3.9 mmol/L (ref 3.5–5.1)
SODIUM: 140 mmol/L (ref 135–145)

## 2015-10-04 MED ORDER — PREDNISONE 20 MG PO TABS: ORAL_TABLET | ORAL | 0 refills | Status: DC

## 2015-10-04 MED ORDER — IPRATROPIUM-ALBUTEROL 0.5-2.5 (3) MG/3ML IN SOLN
3.0000 mL | Freq: Once | RESPIRATORY_TRACT | Status: AC
  Administered 2015-10-04: 3 mL via RESPIRATORY_TRACT
  Filled 2015-10-04: qty 3

## 2015-10-04 MED ORDER — FAMOTIDINE IN NACL 20-0.9 MG/50ML-% IV SOLN
20.0000 mg | Freq: Once | INTRAVENOUS | Status: AC
  Administered 2015-10-04: 20 mg via INTRAVENOUS
  Filled 2015-10-04: qty 50

## 2015-10-04 MED ORDER — SODIUM CHLORIDE 0.9 % IV BOLUS (SEPSIS)
1000.0000 mL | Freq: Once | INTRAVENOUS | Status: AC
  Administered 2015-10-04: 1000 mL via INTRAVENOUS

## 2015-10-04 MED ORDER — DIPHENHYDRAMINE HCL 50 MG/ML IJ SOLN
25.0000 mg | Freq: Once | INTRAMUSCULAR | Status: AC
  Administered 2015-10-04: 25 mg via INTRAVENOUS
  Filled 2015-10-04: qty 1

## 2015-10-04 MED ORDER — SODIUM CHLORIDE 0.9 % IV SOLN: INTRAVENOUS | Status: DC

## 2015-10-04 MED ORDER — EPINEPHRINE 0.3 MG/0.3ML IJ SOAJ: 0.3000 mg | Freq: Once | INTRAMUSCULAR | 0 refills | Status: AC

## 2015-10-04 MED ORDER — METHYLPREDNISOLONE SODIUM SUCC 125 MG IJ SOLR
125.0000 mg | Freq: Once | INTRAMUSCULAR | Status: AC
  Administered 2015-10-04: 125 mg via INTRAVENOUS
  Filled 2015-10-04: qty 2

## 2015-10-04 MED ORDER — EPINEPHRINE 0.3 MG/0.3ML IJ SOAJ
INTRAMUSCULAR | Status: AC
  Filled 2015-10-04: qty 0.3

## 2015-10-04 NOTE — ED Provider Notes (Signed)
[facility]-EMERGENCY DEPT Provider Note   CSN: [id number] Arrival date & time: 10/04/15  1533  History   Chief Complaint Chief Complaint  Patient presents with  . Allergic Reaction   HPI Henry Hardin is a 30 y.o. male.  The history is provided by the patient. No language interpreter was used.  Allergic Reaction  Presenting symptoms: difficulty breathing, itching, rash and wheezing   Severity:  Moderate Prior allergic episodes:  No prior episodes Context: insect bite/sting   Relieved by:  None tried Worsened by:  Nothing Ineffective treatments:  None tried   Past Medical History:  Diagnosis Date  . Asthma   . Pneumonia     Patient Active Problem List   Diagnosis Date Noted  . ALLERGIC RHINITIS 08/02/2009  . RESPIRATORY FAILURE, ACUTE 08/02/2009  . GERD 08/02/2009  . HYPERGLYCEMIA 08/02/2009    History reviewed. No pertinent surgical history.   Home Medications    Prior to Admission medications   Medication Sig Start Date End Date Taking? Authorizing Provider  acetaminophen (TYLENOL) 500 MG tablet Take 500-1,000 mg by mouth every 6 (six) hours as needed (for pain).   Yes Historical Provider, MD  aspirin EC 325 MG tablet Take 325 mg by mouth every 8 (eight) hours as needed (for pain).   Yes Historical Provider, MD  ibuprofen (ADVIL,MOTRIN) 200 MG tablet Take 200-800 mg by mouth every 6 (six) hours as needed (for pain).   Yes Historical Provider, MD  albuterol (PROAIR HFA) 108 (90 BASE) MCG/ACT inhaler Inhale 2 puffs into the lungs every 6 (six) hours as needed for wheezing or shortness of breath. Patient not taking: Reported on 09/20/2015 12/08/14   Fayrene HelperBowie Tran, PA-C  albuterol (PROVENTIL HFA;VENTOLIN HFA) 108 (90 Base) MCG/ACT inhaler Inhale 2 puffs into the lungs every 6 (six) hours as needed for wheezing or shortness of breath. Patient not taking: Reported on 10/04/2015 04/05/15   Darci Currentandolph N Brown, MD  clindamycin (CLEOCIN) 300 MG capsule Take 1 capsule (300 mg total) by  mouth 3 (three) times daily. Patient not taking: Reported on 09/20/2015 07/05/15   Charm RingsErin J Honig, MD  diazepam (VALIUM) 5 MG tablet Take 1 tablet (5 mg total) by mouth every 8 (eight) hours as needed for anxiety. Patient not taking: Reported on 09/20/2015 04/07/15   Emily FilbertJonathan E Williams, MD  EPINEPHrine 0.3 mg/0.3 mL IJ SOAJ injection Inject 0.3 mLs (0.3 mg total) into the muscle once. 10/04/15 10/04/15  Melene Planan Floyd, DO  guaiFENesin-codeine 100-10 MG/5ML syrup Take 5-10 mLs by mouth every 6 (six) hours as needed for cough. Patient not taking: Reported on 09/20/2015 02/07/15   Arthor CaptainAbigail Harris, PA-C  HYDROcodone-acetaminophen (NORCO) 5-325 MG tablet Take 1 tablet by mouth every 6 (six) hours as needed for moderate pain. Patient not taking: Reported on 09/20/2015 07/05/15   Charm RingsErin J Honig, MD  predniSONE (DELTASONE) 20 MG tablet 3 Tabs PO Days 1-3, then 2 tabs PO Days 4-6, then 1 tab PO Day 7-9, then Half Tab PO Day 10-12 Patient not taking: Reported on 10/04/2015 09/20/15   Earley FavorGail Schulz, NP  predniSONE (DELTASONE) 20 MG tablet 2 tabs po daily x 4 days 10/04/15   Melene Planan Floyd, DO    Family History No family history on file.  Social History Social History  Substance Use Topics  . Smoking status: Current Every Day Smoker    Packs/day: 1.00    Types: Cigarettes  . Smokeless tobacco: Never Used  . Alcohol use Yes     Comment: occasional  Allergies   Bee venom   Review of Systems Review of Systems  Constitutional: Negative for chills and fever.  HENT: Negative for ear pain and sore throat.   Eyes: Negative for pain and visual disturbance.  Respiratory: Positive for chest tightness, shortness of breath and wheezing. Negative for cough.   Cardiovascular: Negative for chest pain and palpitations.  Gastrointestinal: Negative for abdominal pain and vomiting.  Genitourinary: Negative for dysuria and hematuria.  Musculoskeletal: Negative for arthralgias and back pain.  Skin: Positive for itching and rash.  Negative for color change.  Neurological: Negative for seizures and syncope.  All other systems reviewed and are negative.  Physical Exam Updated Vital Signs BP 142/87   Pulse 62   Temp 98 F (36.7 C) (Oral)   Resp 16   SpO2 97%   Physical Exam  Constitutional: He appears well-developed and well-nourished. He appears distressed.  HENT:  Head: Normocephalic and atraumatic.  Eyes: Conjunctivae are normal.  Neck: Neck supple.  Cardiovascular: Normal rate and regular rhythm.   No murmur heard. Pulmonary/Chest: Effort normal. No respiratory distress. He has wheezes.  87% on room air  Abdominal: Soft. There is no tenderness.  Musculoskeletal: He exhibits no edema.  Neurological: He is alert.  Skin: Skin is warm. He is diaphoretic.  Diffuse hives  Psychiatric: He has a normal mood and affect.  Nursing note and vitals reviewed.    ED Treatments / Results  Labs (all labs ordered are listed, but only abnormal results are displayed) Labs Reviewed  BASIC METABOLIC PANEL - Abnormal; Notable for the following:       Result Value   Glucose, Bld 158 (*)    Creatinine, Ser 1.36 (*)    All other components within normal limits  CBC WITH DIFFERENTIAL/PLATELET    EKG  EKG Interpretation None       Radiology No results found.  Procedures Procedures (including critical care time)  Medications Ordered in ED Medications  sodium chloride 0.9 % bolus 1,000 mL (0 mLs Intravenous Stopped 10/04/15 1648)  famotidine (PEPCID) IVPB 20 mg premix (0 mg Intravenous Stopped 10/04/15 1648)  diphenhydrAMINE (BENADRYL) injection 25 mg (25 mg Intravenous Given 10/04/15 1601)  methylPREDNISolone sodium succinate (SOLU-MEDROL) 125 mg/2 mL injection 125 mg (125 mg Intravenous Given 10/04/15 1601)  ipratropium-albuterol (DUONEB) 0.5-2.5 (3) MG/3ML nebulizer solution 3 mL (3 mLs Nebulization Given 10/04/15 1609)     Initial Impression / Assessment and Plan / ED Course  I have reviewed the triage  vital signs and the nursing notes.  Pertinent labs & imaging results that were available during my care of the patient were reviewed by me and considered in my medical decision making (see chart for details).  Clinical Course    Patient is a 30 year old gentleman with history of asthma control with albuterol inhaler at home who presents via private vehicle for evaluation of shortness of breath, rash, wheezing. He was stung by several yellow jackets at roughly 12:30 PM and had subsequent onset of symptoms.  Patient brought immediately to resuscitation bay. He was satting 87% on room air, blood pressure 127/74.  Diffuse wheezing on lung exam, hives diffusely. No stinger stuck in patient's hand or back.    Anaphylactic reaction, IM epinephrine, Pepcid, Benadryl, Solu-Medrol order given to patient. Supplemental oxygen given, DuoNeb treatment given.  4:30 PM: Patient reevaluated, feeling much better, satting 100% room air. Eyes improving, wheezing improving. We'll need to observe until roughly 7 PM.  7:00 PM:  Patient reevaluated, no return  of symptoms. Okay for discharge home. EpiPen, prednisone prescribed for patient, return precautions given for return of symptoms.  Discussed w/ Dr. Adela LankFloyd.     Final Clinical Impressions(s) / ED Diagnoses   Final diagnoses:  Allergic reaction, initial encounter  Anaphylaxis, initial encounter   New Prescriptions Discharge Medication List as of 10/04/2015  7:12 PM    START taking these medications   Details  EPINEPHrine 0.3 mg/0.3 mL IJ SOAJ injection Inject 0.3 mLs (0.3 mg total) into the muscle once., Starting Thu 10/04/2015, Print    !! predniSONE (DELTASONE) 20 MG tablet 2 tabs po daily x 4 days, Print     !! - Potential duplicate medications found. Please discuss with provider.         Dan HumphreysMichael Fredric Slabach, MD 10/04/15 2353    Melene Planan Floyd, DO 10/05/15 0001

## 2015-10-04 NOTE — ED Triage Notes (Signed)
Pt was stung by bee 30 minutes ago on right hand and back. Pt has full body hives and wheezing. Pt is diaphoretic. Able to speak in complete sentences.

## 2015-10-05 NOTE — ED Notes (Addendum)
Pt was brought in trauma B and given 0.3mg  IM in right thigh.

## 2015-10-09 ENCOUNTER — Encounter (HOSPITAL_COMMUNITY): Payer: Self-pay | Admitting: *Deleted

## 2015-10-09 ENCOUNTER — Emergency Department (HOSPITAL_COMMUNITY)
Admission: EM | Admit: 2015-10-09 | Discharge: 2015-10-09 | Disposition: A | Discharge status: Home / Self Care | Payer: Medicaid Other | Attending: Emergency Medicine | Admitting: Emergency Medicine

## 2015-10-09 DIAGNOSIS — J45901 Unspecified asthma with (acute) exacerbation: Secondary | ICD-10-CM | POA: Insufficient documentation

## 2015-10-09 DIAGNOSIS — F17210 Nicotine dependence, cigarettes, uncomplicated: Secondary | ICD-10-CM | POA: Insufficient documentation

## 2015-10-09 DIAGNOSIS — Z7982 Long term (current) use of aspirin: Secondary | ICD-10-CM | POA: Insufficient documentation

## 2015-10-09 DIAGNOSIS — Z7951 Long term (current) use of inhaled steroids: Secondary | ICD-10-CM | POA: Insufficient documentation

## 2015-10-09 MED ORDER — PREDNISONE 20 MG PO TABS: 60.0000 mg | ORAL_TABLET | Freq: Every day | ORAL | 0 refills | Status: DC

## 2015-10-09 MED ORDER — ALBUTEROL SULFATE HFA 108 (90 BASE) MCG/ACT IN AERS
2.0000 | INHALATION_SPRAY | RESPIRATORY_TRACT | Status: DC | PRN
  Administered 2015-10-09: 2 via RESPIRATORY_TRACT
  Filled 2015-10-09: qty 6.7

## 2015-10-09 NOTE — ED Notes (Signed)
Patient's sats with ambulation were 99 - 100%.

## 2015-10-09 NOTE — ED Notes (Signed)
Bed: WA15 Expected date:  Expected time:  Means of arrival:  Comments: EMS  

## 2015-10-09 NOTE — ED Provider Notes (Signed)
[facility]-EMERGENCY DEPT Provider Note   CSN: [id number] Arrival date & time: 10/09/15  0840     History   Chief Complaint Chief Complaint  Patient presents with  . Shortness of Breath    HPI Henry Hardin is a 30 y.o. male.  Patient with a history of Asthma presents today with a chief complaint of SOB onset this morning.  He was brought to the ED via EMS and was given 125 mg IV Solumedrol, 10 mg neb Albuterol, and 0.5 mg Atrovent en route to the ED.  He reports that his symptoms have improved at this time and that he is no longer short of breath.  He states that he did not take any medication prior to calling EMS because he ran out of his inhaler.  He reports that he has had an associated dry cough over the past week.  Denies fever, chills, chest pain, dizziness, syncope, LE edema, or any other symptoms at this time.      Past Medical History:  Diagnosis Date  . Asthma   . Pneumonia     Patient Active Problem List   Diagnosis Date Noted  . ALLERGIC RHINITIS 08/02/2009  . RESPIRATORY FAILURE, ACUTE 08/02/2009  . GERD 08/02/2009  . HYPERGLYCEMIA 08/02/2009    History reviewed. No pertinent surgical history.     Home Medications    Prior to Admission medications   Medication Sig Start Date End Date Taking? Authorizing Provider  acetaminophen (TYLENOL) 500 MG tablet Take 500-1,000 mg by mouth every 6 (six) hours as needed (for pain).   Yes Historical Provider, MD  aspirin EC 325 MG tablet Take 325 mg by mouth every 8 (eight) hours as needed (for pain).   Yes Historical Provider, MD  ibuprofen (ADVIL,MOTRIN) 200 MG tablet Take 200-800 mg by mouth every 6 (six) hours as needed (for pain).   Yes Historical Provider, MD  albuterol (PROAIR HFA) 108 (90 BASE) MCG/ACT inhaler Inhale 2 puffs into the lungs every 6 (six) hours as needed for wheezing or shortness of breath. Patient not taking: Reported on 09/20/2015 12/08/14   Fayrene HelperBowie Tran, PA-C  albuterol (PROVENTIL HFA;VENTOLIN HFA)  108 (90 Base) MCG/ACT inhaler Inhale 2 puffs into the lungs every 6 (six) hours as needed for wheezing or shortness of breath. Patient not taking: Reported on 10/04/2015 04/05/15   Darci Currentandolph N Brown, MD  clindamycin (CLEOCIN) 300 MG capsule Take 1 capsule (300 mg total) by mouth 3 (three) times daily. Patient not taking: Reported on 09/20/2015 07/05/15   Charm RingsErin J Honig, MD  diazepam (VALIUM) 5 MG tablet Take 1 tablet (5 mg total) by mouth every 8 (eight) hours as needed for anxiety. Patient not taking: Reported on 09/20/2015 04/07/15   Emily FilbertJonathan E Williams, MD  guaiFENesin-codeine 100-10 MG/5ML syrup Take 5-10 mLs by mouth every 6 (six) hours as needed for cough. Patient not taking: Reported on 09/20/2015 02/07/15   Arthor CaptainAbigail Harris, PA-C  HYDROcodone-acetaminophen (NORCO) 5-325 MG tablet Take 1 tablet by mouth every 6 (six) hours as needed for moderate pain. Patient not taking: Reported on 09/20/2015 07/05/15   Charm RingsErin J Honig, MD  predniSONE (DELTASONE) 20 MG tablet 3 Tabs PO Days 1-3, then 2 tabs PO Days 4-6, then 1 tab PO Day 7-9, then Half Tab PO Day 10-12 Patient not taking: Reported on 10/04/2015 09/20/15   Earley FavorGail Schulz, NP  predniSONE (DELTASONE) 20 MG tablet 2 tabs po daily x 4 days Patient not taking: Reported on 10/09/2015 10/04/15   Melene Planan Floyd,  DO    Family History No family history on file.  Social History Social History  Substance Use Topics  . Smoking status: Current Every Day Smoker    Packs/day: 1.00    Types: Cigarettes  . Smokeless tobacco: Never Used  . Alcohol use Yes     Comment: occasional     Allergies   Bee venom   Review of Systems Review of Systems  All other systems reviewed and are negative.    Physical Exam Updated Vital Signs BP 126/74 (BP Location: Left Arm)   Pulse 64   Temp 98 F (36.7 C) (Oral)   Resp 16   SpO2 100%   Physical Exam  Constitutional: He appears well-developed and well-nourished. No distress.  HENT:  Head: Normocephalic and atraumatic.    Mouth/Throat: Oropharynx is clear and moist.  Neck: Normal range of motion. Neck supple.  Cardiovascular: Normal rate, regular rhythm and normal heart sounds.   Pulmonary/Chest: Effort normal and breath sounds normal. No respiratory distress. He has no wheezes. He has no rales.  Musculoskeletal: Normal range of motion.  Neurological: He is alert.  Skin: Skin is warm and dry. He is not diaphoretic.  Psychiatric: He has a normal mood and affect.  Nursing note and vitals reviewed.    ED Treatments / Results  Labs (all labs ordered are listed, but only abnormal results are displayed) Labs Reviewed - No data to display  EKG  EKG Interpretation None       Radiology No results found.  Procedures Procedures (including critical care time)  Medications Ordered in ED Medications  albuterol (PROVENTIL HFA;VENTOLIN HFA) 108 (90 Base) MCG/ACT inhaler 2 puff (not administered)     Initial Impression / Assessment and Plan / ED Course  I have reviewed the triage vital signs and the nursing notes.  Pertinent labs & imaging results that were available during my care of the patient were reviewed by me and considered in my medical decision making (see chart for details).  Clinical Course    Final Clinical Impressions(s) / ED Diagnoses   Final diagnoses:  None   Patient with a history of Asthma presents today with a chief complaint of SOB.  He was brought in by EMS and given 10 mg Albuterol, 0.5 mg Atrovent, and 125 mg IV Solumedrol en route to the ED.  By the time of my evaluation he reports that his SOB had resolved and he felt that he was back to baseline.  Patient ambulated in the ED without SOB and maintained oxygen levels of 98-99.  Feel that he is stable for discharge.  Return precautions given.   New Prescriptions New Prescriptions   No medications on file     Santiago GladHeather Haylen Bellotti, PA-C 10/11/15 1502    Benjiman CoreNathan Pickering, MD 10/13/15 [phone]

## 2015-10-09 NOTE — ED Notes (Signed)
Patient presents with asthma exacerbation x1 week.  He has experienced increased shortness of breath and increased work of breathing over past several days.  He has a dry, unproductive cough, but denies fever and N/V/D.    Patient does not have a PCP and has trouble paying for his albuterol inhalers.  He is on no long-term control medications.  Patient's lung sounds are currently clear to auscultation following 10 mg of nebulized Albuterol and 125 mg of Solumedrol with EMS.  Patient denies pain.

## 2015-10-09 NOTE — ED Triage Notes (Signed)
Per EMS - patient comes from home where he lives alone.  He called EMS for difficulty breathing and on their arrival had notable increased WOB and expiratory/inspiratory wheezing in all fields.  Patient received 10 mg Albuterol and 0.5 mg Atrovent, as well as 125 mg Solumedrol en route with EMS.  Patient denies recent illness, but states he has had several episodes of recent asthma exacerbations.  [facility]  Patient does not have an inhaler at home currently.  Vitals 139/78, HR 62, RR 20, 100% on 10L neb, 98% on RA.

## 2015-11-06 ENCOUNTER — Emergency Department (HOSPITAL_COMMUNITY)
Admission: EM | Admit: 2015-11-06 | Discharge: 2015-11-06 | Disposition: A | Discharge status: Home / Self Care | Payer: Medicaid Other | Attending: Emergency Medicine | Admitting: Emergency Medicine

## 2015-11-06 ENCOUNTER — Encounter (HOSPITAL_COMMUNITY): Payer: Self-pay | Admitting: Emergency Medicine

## 2015-11-06 ENCOUNTER — Emergency Department (HOSPITAL_COMMUNITY): Payer: Medicaid Other

## 2015-11-06 DIAGNOSIS — Z79899 Other long term (current) drug therapy: Secondary | ICD-10-CM | POA: Insufficient documentation

## 2015-11-06 DIAGNOSIS — Z7982 Long term (current) use of aspirin: Secondary | ICD-10-CM | POA: Insufficient documentation

## 2015-11-06 DIAGNOSIS — F17210 Nicotine dependence, cigarettes, uncomplicated: Secondary | ICD-10-CM | POA: Insufficient documentation

## 2015-11-06 DIAGNOSIS — J45901 Unspecified asthma with (acute) exacerbation: Secondary | ICD-10-CM | POA: Insufficient documentation

## 2015-11-06 MED ORDER — PREDNISONE 20 MG PO TABS: 60.0000 mg | ORAL_TABLET | Freq: Every day | ORAL | 0 refills | Status: DC

## 2015-11-06 MED ORDER — GUAIFENESIN 100 MG/5ML PO LIQD: 100.0000 mg | ORAL | 0 refills | Status: DC | PRN

## 2015-11-06 MED ORDER — PREDNISONE 20 MG PO TABS
60.0000 mg | ORAL_TABLET | Freq: Once | ORAL | Status: AC
  Administered 2015-11-06: 60 mg via ORAL
  Filled 2015-11-06: qty 3

## 2015-11-06 MED ORDER — ALBUTEROL SULFATE HFA 108 (90 BASE) MCG/ACT IN AERS
1.0000 | INHALATION_SPRAY | Freq: Once | RESPIRATORY_TRACT | Status: AC
  Administered 2015-11-06: 2 via RESPIRATORY_TRACT
  Filled 2015-11-06: qty 6.7

## 2015-11-06 MED ORDER — CETIRIZINE-PSEUDOEPHEDRINE ER 5-120 MG PO TB12: 1.0000 | ORAL_TABLET | Freq: Two times a day (BID) | ORAL | 0 refills | Status: DC

## 2015-11-06 MED ORDER — ALBUTEROL SULFATE (2.5 MG/3ML) 0.083% IN NEBU
5.0000 mg | INHALATION_SOLUTION | Freq: Once | RESPIRATORY_TRACT | Status: AC
  Administered 2015-11-06: 5 mg via RESPIRATORY_TRACT
  Filled 2015-11-06: qty 6

## 2015-11-06 NOTE — ED Notes (Signed)
Pt reports feels much better after breathing treatment. Denies SOB now just feels congested.

## 2015-11-06 NOTE — ED Triage Notes (Signed)
Pt c/o difficulty breathing, increased work of breathing and wheezing audible in all fields onset today. Pt 89% on RA. Breathing treatment started sating 98%.

## 2015-11-06 NOTE — Discharge Instructions (Signed)
Medications: Albuterol inhaler, prednisone, Zyrtec-D, Mucinex  Treatment: Use albuterol inhaler every 4-6 hours as needed for wheezing and shortness of breath. Take prednisone as prescribed for 5 days. Take Zyrtec-D twice daily for your allergy symptoms. Take Mucinex as prescribed for cough. I recommend that you quit smoking, as this will help your chronic cough and asthma exacerbations.  Follow-up: [facility] further evaluation and treatment of your asthma. Please return to emergency department if you develop any new or worsening symptoms.

## 2015-11-06 NOTE — ED Provider Notes (Signed)
[facility]-EMERGENCY DEPT Provider Note   CSN: [id number] Arrival date & time: 11/06/15  1935     History   Chief Complaint Chief Complaint  Patient presents with  . Shortness of Breath    HPI Henry Hardin is a 30 y.o. male with history of asthma who presents with shortness of breath. Patient was given albuterol nebulizer prior to my H&P, and he states that he is feeling much better and breathing back to baseline. Patient states he has started and being job where he is exposed to a lot of dust, and has been having to use his inhaler more often. He has run out of his inhaler. Patient has a chronic cough that he has had since he began smoking 12 years ago. Patient denies any chest pain, current shortness breath, abdominal pain, nausea, vomiting. Patient does not currently have a primary care provider and [facility]  HPI  Past Medical History:  Diagnosis Date  . Asthma   . Pneumonia     Patient Active Problem List   Diagnosis Date Noted  . ALLERGIC RHINITIS 08/02/2009  . RESPIRATORY FAILURE, ACUTE 08/02/2009  . GERD 08/02/2009  . HYPERGLYCEMIA 08/02/2009    History reviewed. No pertinent surgical history.     Home Medications    Prior to Admission medications   Medication Sig Start Date End Date Taking? Authorizing Provider  acetaminophen (TYLENOL) 500 MG tablet Take 500-1,000 mg by mouth every 6 (six) hours as needed (for pain).    Historical Provider, MD  albuterol (PROAIR HFA) 108 (90 BASE) MCG/ACT inhaler Inhale 2 puffs into the lungs every 6 (six) hours as needed for wheezing or shortness of breath. Patient not taking: Reported on 09/20/2015 12/08/14   Fayrene HelperBowie Tran, PA-C  albuterol (PROVENTIL HFA;VENTOLIN HFA) 108 (90 Base) MCG/ACT inhaler Inhale 2 puffs into the lungs every 6 (six) hours as needed for wheezing or shortness of breath. Patient not taking: Reported on 10/04/2015 04/05/15   Darci Currentandolph N Brown, MD  aspirin EC 325 MG  tablet Take 325 mg by mouth every 8 (eight) hours as needed (for pain).    Historical Provider, MD  cetirizine-pseudoephedrine (ZYRTEC-D) 5-120 MG tablet Take 1 tablet by mouth 2 (two) times daily. 11/06/15   Emi HolesAlexandra M Darrell Leonhardt, PA-C  clindamycin (CLEOCIN) 300 MG capsule Take 1 capsule (300 mg total) by mouth 3 (three) times daily. Patient not taking: Reported on 09/20/2015 07/05/15   Charm RingsErin J Honig, MD  diazepam (VALIUM) 5 MG tablet Take 1 tablet (5 mg total) by mouth every 8 (eight) hours as needed for anxiety. Patient not taking: Reported on 09/20/2015 04/07/15   Emily FilbertJonathan E Williams, MD  guaiFENesin (ROBITUSSIN) 100 MG/5ML liquid Take 5-10 mLs (100-200 mg total) by mouth every 4 (four) hours as needed for cough. 11/06/15   Lexie Koehl M Mylinda Brook, PA-C  guaiFENesin-codeine 100-10 MG/5ML syrup Take 5-10 mLs by mouth every 6 (six) hours as needed for cough. Patient not taking: Reported on 09/20/2015 02/07/15   Arthor CaptainAbigail Harris, PA-C  HYDROcodone-acetaminophen (NORCO) 5-325 MG tablet Take 1 tablet by mouth every 6 (six) hours as needed for moderate pain. Patient not taking: Reported on 09/20/2015 07/05/15   Charm RingsErin J Honig, MD  ibuprofen (ADVIL,MOTRIN) 200 MG tablet Take 200-800 mg by mouth every 6 (six) hours as needed (for pain).    Historical Provider, MD  predniSONE (DELTASONE) 20 MG tablet Take 3 tablets (60 mg total) by mouth daily. 11/06/15   Emi HolesAlexandra M Zakaria Fromer, PA-C  Family History No family history on file.  Social History Social History  Substance Use Topics  . Smoking status: Current Every Day Smoker    Packs/day: 1.00    Types: Cigarettes  . Smokeless tobacco: Never Used  . Alcohol use Yes     Comment: occasional     Allergies   Bee venom   Review of Systems Review of Systems  Constitutional: Negative for chills and fever.  HENT: Positive for congestion. Negative for facial swelling and sore throat.   Respiratory: Positive for cough, shortness of breath and wheezing.   Cardiovascular: Negative for  chest pain.  Gastrointestinal: Negative for abdominal pain, nausea and vomiting.  Genitourinary: Negative for dysuria.  Musculoskeletal: Negative for back pain.  Skin: Negative for rash and wound.  Neurological: Negative for headaches.  Psychiatric/Behavioral: The patient is not nervous/anxious.      Physical Exam Updated Vital Signs BP 136/90   Pulse 78   Temp 98.3 F (36.8 C)   Resp 20   SpO2 (!) 89%   Physical Exam  Constitutional: He appears well-developed and well-nourished. No distress.  HENT:  Head: Normocephalic and atraumatic.  Mouth/Throat: Oropharynx is clear and moist. No oropharyngeal exudate.  Eyes: Conjunctivae are normal. Pupils are equal, round, and reactive to light. Right eye exhibits no discharge. Left eye exhibits no discharge. No scleral icterus.  Neck: Normal range of motion. Neck supple. No thyromegaly present.  Cardiovascular: Normal rate, regular rhythm, normal heart sounds and intact distal pulses.  Exam reveals no gallop and no friction rub.   No murmur heard. Pulmonary/Chest: Effort normal. No stridor. No respiratory distress. He has wheezes (few expiratory wheezes). He has rhonchi (diffuse). He has no rales.  Abdominal: Soft. Bowel sounds are normal. He exhibits no distension. There is no tenderness. There is no rebound and no guarding.  Musculoskeletal: He exhibits no edema.  Lymphadenopathy:    He has no cervical adenopathy.  Neurological: He is alert. Coordination normal.  Skin: Skin is warm and dry. No rash noted. He is not diaphoretic. No pallor.  Psychiatric: He has a normal mood and affect.  Nursing note and vitals reviewed.    ED Treatments / Results  Labs (all labs ordered are listed, but only abnormal results are displayed) Labs Reviewed - No data to display  EKG  EKG Interpretation  Date/Time:  Tuesday November 06 2015 20:58:54 EDT Ventricular Rate:  65 PR Interval:    QRS Duration: 89 QT Interval:  382 QTC  Calculation: 398 R Axis:   57 Text Interpretation:  Sinus rhythm RSR' in V1 or V2, probably normal variant No significant change since last tracing Confirmed by KNOTT MD, DANIEL [phone]) on 11/06/2015 10:00:05 PM       Radiology Dg Chest 2 View  Result Date: 11/06/2015 CLINICAL DATA:  Acute shortness of breath for 2 days EXAM: CHEST  2 VIEW COMPARISON:  04/07/2015 FINDINGS: The heart size and mediastinal contours are within normal limits. Both lungs are clear. The visualized skeletal structures are unremarkable. IMPRESSION: No active cardiopulmonary disease. Electronically Signed   By: Judie Petit.  Shick M.D.   On: 11/06/2015 20:30    Procedures Procedures (including critical care time)  Medications Ordered in ED Medications  albuterol (PROVENTIL) (2.5 MG/3ML) 0.083% nebulizer solution 5 mg (5 mg Nebulization Given 11/06/15 1955)  predniSONE (DELTASONE) tablet 60 mg (60 mg Oral Given 11/06/15 2153)  albuterol (PROVENTIL HFA;VENTOLIN HFA) 108 (90 Base) MCG/ACT inhaler 1-2 puff (2 puffs Inhalation Given 11/06/15 2154)  Initial Impression / Assessment and Plan / ED Course  I have reviewed the triage vital signs and the nursing notes.  Pertinent labs & imaging results that were available during my care of the patient were reviewed by me and considered in my medical decision making (see chart for details).  Clinical Course    Patient with asthma exacerbation. Patient ambulated in ED with O2 saturations maintained >90, no current signs of respiratory distress. CXR shows no active cardiopulmonary disease. EKG shows NSR, no significant change since last tracing. Lung exam has very few, mild wheezes. I did not have a pretreatment on exam, as the patient received treatment prior. Prednisone given in the ED and pt will bd dc with 5 day burst. Patient also discharged with Zyrtec-D, guaifenesin for nasal congestion and cough. Pt states they are breathing at baseline. Pt has been instructed to continue using  prescribed medications and establish care with PCP for further evaluation and treatment of his asthma. Patient advised to quit smoking, as this would help his asthma and chronic cough. Patient vitals stable and patient discharged in satisfactory condition.   Final Clinical Impressions(s) / ED Diagnoses   Final diagnoses:  Asthma exacerbation    New Prescriptions New Prescriptions   CETIRIZINE-PSEUDOEPHEDRINE (ZYRTEC-D) 5-120 MG TABLET    Take 1 tablet by mouth 2 (two) times daily.   GUAIFENESIN (ROBITUSSIN) 100 MG/5ML LIQUID    Take 5-10 mLs (100-200 mg total) by mouth every 4 (four) hours as needed for cough.   PREDNISONE (DELTASONE) 20 MG TABLET    Take 3 tablets (60 mg total) by mouth daily.     Emi Holes, PA-C 11/06/15 2203    Lyndal Pulley, MD 11/07/15 1322

## 2015-11-06 NOTE — Progress Notes (Signed)
Pt was ambulated in the unit and SATs remained in the high 90's,

## 2015-11-06 NOTE — ED Notes (Signed)
Patient reports feeling better after treatment and would just like an inhaler refill.

## 2015-11-20 ENCOUNTER — Emergency Department (HOSPITAL_COMMUNITY)
Admission: EM | Admit: 2015-11-20 | Discharge: 2015-11-20 | Disposition: A | Discharge status: Home / Self Care | Payer: Medicaid Other | Attending: Physician Assistant | Admitting: Physician Assistant

## 2015-11-20 ENCOUNTER — Encounter (HOSPITAL_COMMUNITY): Payer: Self-pay | Admitting: Emergency Medicine

## 2015-11-20 DIAGNOSIS — Z5321 Procedure and treatment not carried out due to patient leaving prior to being seen by health care provider: Secondary | ICD-10-CM | POA: Insufficient documentation

## 2015-11-20 DIAGNOSIS — F17210 Nicotine dependence, cigarettes, uncomplicated: Secondary | ICD-10-CM | POA: Insufficient documentation

## 2015-11-20 DIAGNOSIS — J45909 Unspecified asthma, uncomplicated: Secondary | ICD-10-CM | POA: Insufficient documentation

## 2015-11-20 DIAGNOSIS — Z7982 Long term (current) use of aspirin: Secondary | ICD-10-CM | POA: Insufficient documentation

## 2015-11-20 MED ORDER — ALBUTEROL SULFATE (2.5 MG/3ML) 0.083% IN NEBU
5.0000 mg | INHALATION_SOLUTION | Freq: Once | RESPIRATORY_TRACT | Status: AC
  Administered 2015-11-20: 5 mg via RESPIRATORY_TRACT

## 2015-11-20 MED ORDER — ALBUTEROL SULFATE (2.5 MG/3ML) 0.083% IN NEBU
INHALATION_SOLUTION | RESPIRATORY_TRACT | Status: AC
  Filled 2015-11-20: qty 6

## 2015-11-20 NOTE — ED Triage Notes (Signed)
Pt. reports asthma attack this morning with wheezing and occasional dry cough .

## 2015-12-18 ENCOUNTER — Emergency Department (HOSPITAL_COMMUNITY)
Admission: EM | Admit: 2015-12-18 | Discharge: 2015-12-18 | Disposition: A | Discharge status: Home / Self Care | Payer: Self-pay | Attending: Emergency Medicine | Admitting: Emergency Medicine

## 2015-12-18 ENCOUNTER — Emergency Department (HOSPITAL_COMMUNITY): Payer: Self-pay

## 2015-12-18 ENCOUNTER — Encounter (HOSPITAL_COMMUNITY): Payer: Self-pay | Admitting: Family Medicine

## 2015-12-18 DIAGNOSIS — Z79899 Other long term (current) drug therapy: Secondary | ICD-10-CM | POA: Insufficient documentation

## 2015-12-18 DIAGNOSIS — J45901 Unspecified asthma with (acute) exacerbation: Secondary | ICD-10-CM | POA: Insufficient documentation

## 2015-12-18 DIAGNOSIS — F17210 Nicotine dependence, cigarettes, uncomplicated: Secondary | ICD-10-CM | POA: Insufficient documentation

## 2015-12-18 MED ORDER — AEROCHAMBER PLUS FLO-VU MEDIUM MISC
1.0000 | Freq: Once | Status: AC
  Administered 2015-12-18: 1
  Filled 2015-12-18: qty 1

## 2015-12-18 MED ORDER — PREDNISONE 20 MG PO TABS: 40.0000 mg | ORAL_TABLET | Freq: Every day | ORAL | 0 refills | Status: DC

## 2015-12-18 MED ORDER — ALBUTEROL SULFATE HFA 108 (90 BASE) MCG/ACT IN AERS
1.0000 | INHALATION_SPRAY | RESPIRATORY_TRACT | Status: DC | PRN
  Administered 2015-12-18: 1 via RESPIRATORY_TRACT
  Filled 2015-12-18: qty 6.7

## 2015-12-18 MED ORDER — PREDNISONE 20 MG PO TABS
60.0000 mg | ORAL_TABLET | Freq: Once | ORAL | Status: AC
  Administered 2015-12-18: 60 mg via ORAL
  Filled 2015-12-18: qty 3

## 2015-12-18 MED ORDER — ALBUTEROL SULFATE (2.5 MG/3ML) 0.083% IN NEBU
5.0000 mg | INHALATION_SOLUTION | Freq: Once | RESPIRATORY_TRACT | Status: AC
  Administered 2015-12-18: 5 mg via RESPIRATORY_TRACT
  Filled 2015-12-18: qty 6

## 2015-12-18 NOTE — ED Provider Notes (Signed)
[facility]-EMER[facility]ENCY DEPT Provider Note   CSN: [id number] Arrival date & time: 12/18/15  0137   By signing my name below, I, Valentino SaxonBianca Contreras, attest that this documentation has been prepared under the direction and in the presence of Cheri FowlerKayla Kazimir Hartnett, [place]. Electronically Signed: Valentino SaxonBianca Contreras, ED Scribe. 12/18/15. 2:18 AM.  History   Chief Complaint Chief Complaint  Patient presents with  . Asthma   The history is provided by the patient. No language interpreter was used.    HPI Comments: Henry Hardin is a 30 y.o. male with Hx of Asthma who presents to the Emergency Department complaining of shortness of breath c/w asthma attack that occurred two hours ago at work. Pt states symptoms began 4 hours ago. He reports associated mucus and wheezing with cough. No CP, diaphoresis, fever, or unilateral leg swelling. Patient ran out of his albuterol inhaler. Per pt, he reports he is unable to obtain a new inhaler until December due to his current insurance. No hx of DVT/PE, prolong immobilization, recent surgery. Pt denies . No additional complaints at this time.    Past Medical History:  Diagnosis Date  . Asthma   . Pneumonia     Patient Active Problem List   Diagnosis Date Noted  . ALLERGIC RHINITIS 08/02/2009  . RESPIRATORY FAILURE, ACUTE 08/02/2009  . GERD 08/02/2009  . HYPERGLYCEMIA 08/02/2009    History reviewed. No pertinent surgical history.     Home Medications    Prior to Admission medications   Medication Sig Start Date End Date Taking? Authorizing Provider  acetaminophen (TYLENOL) 500 MG tablet Take 500-1,000 mg by mouth every 6 (six) hours as needed (for pain).   Yes Historical Provider, MD  albuterol (PROAIR HFA) 108 (90 BASE) MCG/ACT inhaler Inhale 2 puffs into the lungs every 6 (six) hours as needed for wheezing or shortness of breath. 12/08/14  Yes Fayrene HelperBowie Tran, PA-C  ibuprofen (ADVIL,MOTRIN) 200 MG tablet Take 200-800 mg by mouth every 6 (six) hours as needed (for  pain).   Yes Historical Provider, MD  albuterol (PROVENTIL HFA;VENTOLIN HFA) 108 (90 Base) MCG/ACT inhaler Inhale 2 puffs into the lungs every 6 (six) hours as needed for wheezing or shortness of breath. Patient not taking: Reported on 10/04/2015 04/05/15   Darci Currentandolph N Brown, MD  cetirizine-pseudoephedrine (ZYRTEC-D) 5-120 MG tablet Take 1 tablet by mouth 2 (two) times daily. Patient not taking: Reported on 12/18/2015 11/06/15   Waylan BogaAlexandra M Law, PA-C  clindamycin (CLEOCIN) 300 MG capsule Take 1 capsule (300 mg total) by mouth 3 (three) times daily. Patient not taking: Reported on 09/20/2015 07/05/15   Charm RingsErin J Honig, MD  diazepam (VALIUM) 5 MG tablet Take 1 tablet (5 mg total) by mouth every 8 (eight) hours as needed for anxiety. Patient not taking: Reported on 09/20/2015 04/07/15   Emily FilbertJonathan E Williams, MD  guaiFENesin (ROBITUSSIN) 100 MG/5ML liquid Take 5-10 mLs (100-200 mg total) by mouth every 4 (four) hours as needed for cough. Patient not taking: Reported on 12/18/2015 11/06/15   Waylan BogaAlexandra M Law, PA-C  guaiFENesin-codeine 100-10 MG/5ML syrup Take 5-10 mLs by mouth every 6 (six) hours as needed for cough. Patient not taking: Reported on 09/20/2015 02/07/15   Arthor CaptainAbigail Harris, PA-C  HYDROcodone-acetaminophen (NORCO) 5-325 MG tablet Take 1 tablet by mouth every 6 (six) hours as needed for moderate pain. Patient not taking: Reported on 09/20/2015 07/05/15   Charm RingsErin J Honig, MD  predniSONE (DELTASONE) 20 MG tablet Take 2 tablets (40 mg total) by mouth daily. 12/18/15  Cheri Fowler, PA-C    Family History History reviewed. No pertinent family history.  Social History Social History  Substance Use Topics  . Smoking status: Current Every Day Smoker    Packs/day: 0.75    Types: Cigarettes  . Smokeless tobacco: Never Used  . Alcohol use Yes     Comment: Once every two months     Allergies   Bee venom   Review of Systems Review of Systems  Respiratory: Positive for cough and wheezing. Negative for shortness  of breath.   Cardiovascular: Negative for leg swelling.  All other systems reviewed and are negative.    Physical Exam Updated Vital Signs BP 125/69 (BP Location: Left Arm)   Pulse (!) 55   Temp 97.8 F (36.6 C) (Oral)   Resp 20   Ht 6\' 2"  (1.88 m)   Wt 86.2 kg   SpO2 94%   BMI 24.39 kg/m   Physical Exam  Constitutional: He is oriented to person, place, and time. He appears well-developed and well-nourished.  Non-toxic appearance. He does not have a sickly appearance. He does not appear ill.  HENT:  Head: Normocephalic and atraumatic.  Mouth/Throat: Oropharynx is clear and moist.  Eyes: Conjunctivae are normal.  Neck: Normal range of motion. Neck supple.  Cardiovascular: Normal rate and regular rhythm.   No unilateral leg swelling.   Pulmonary/Chest: Effort normal. No accessory muscle usage or stridor. No respiratory distress. He has wheezes (faint expiratory). He has no rhonchi. He has no rales.  Able to speak in full sentences without difficulty.  94% on RA.  No audible wheezes.  Normal RR.  Abdominal: Soft. Bowel sounds are normal. He exhibits no distension. There is no tenderness.  Musculoskeletal: Normal range of motion.  Lymphadenopathy:    He has no cervical adenopathy.  Neurological: He is alert and oriented to person, place, and time.  Speech clear without dysarthria.  Skin: Skin is warm and dry.  Psychiatric: He has a normal mood and affect. His behavior is normal.     ED Treatments / Results   DIAGNOSTIC STUDIES: Oxygen Saturation is 94% on RA, adequate by my interpretation.    COORDINATION OF CARE: 2:11 AM Discussed treatment plan with pt at bedside which includes Chest XR, Proventil and steroids and pt agreed to plan.  Labs (all labs ordered are listed, but only abnormal results are displayed) Labs Reviewed - No data to display  EKG  EKG Interpretation None       Radiology Dg Chest 2 View  Result Date: 12/18/2015 CLINICAL DATA:  Dyspnea.   History of asthma. EXAM: CHEST  2 VIEW COMPARISON:  11/06/2015 FINDINGS: The heart size and mediastinal contours are within normal limits. Both lungs are clear. The visualized skeletal structures are unremarkable. IMPRESSION: No active cardiopulmonary disease. Electronically Signed   By: Ellery Plunk M.D.   On: 12/18/2015 02:55    Procedures Procedures (including critical care time)  Medications Ordered in ED Medications  albuterol (PROVENTIL HFA;VENTOLIN HFA) 108 (90 Base) MCG/ACT inhaler 1-2 puff (1 puff Inhalation Given 12/18/15 0329)  albuterol (PROVENTIL) (2.5 MG/3ML) 0.083% nebulizer solution 5 mg (5 mg Nebulization Given 12/18/15 0157)  predniSONE (DELTASONE) tablet 60 mg (60 mg Oral Given 12/18/15 0329)  AEROCHAMBER PLUS FLO-VU MEDIUM MISC 1 each (1 each Other Given 12/18/15 0330)     Initial Impression / Assessment and Plan / ED Course  I have reviewed the triage vital signs and the nursing notes.  Pertinent labs & imaging results  that were available during my care of the patient were reviewed by me and considered in my medical decision making (see chart for details).  Clinical Course  Value Comment By Time  DG Chest 2 View (Reviewed) Cheri FowlerKayla Daysen Gundrum, PA-C 10/31 0236  DG Chest 2 View (Reviewed) Cheri FowlerKayla Coralyn Roselli, PA-C 10/31 0240   Patient maintained >90, no current signs of respiratory distress. Lung exam improved after nebulizer treatment. Prednisone and albuterol given in the ED and pt will bd dc with 5 day burst. Pt states they are breathing at baseline. Pt has been instructed to continue using prescribed medications and to speak with PCP about today's exacerbation.    Final Clinical Impressions(s) / ED Diagnoses   Final diagnoses:  Exacerbation of asthma, unspecified asthma severity, unspecified whether persistent    New Prescriptions New Prescriptions   PREDNISONE (DELTASONE) 20 MG TABLET    Take 2 tablets (40 mg total) by mouth daily.   I personally performed the  services described in this documentation, which was scribed in my presence. The recorded information has been reviewed and is accurate.     Cheri FowlerKayla Alixandrea Milleson, PA-C 12/18/15 [id number]    Gilda Creasehristopher J Pollina, MD 12/18/15 [phone]

## 2015-12-18 NOTE — ED Triage Notes (Signed)
Patient reports he started experiencing an asthma attack about 2 hours ago while at work. Pt has a rescue inhaler but reports it was empty.

## 2016-01-04 ENCOUNTER — Emergency Department (HOSPITAL_COMMUNITY)
Admission: EM | Admit: 2016-01-04 | Discharge: 2016-01-04 | Discharge status: Against Medical Advice | Payer: Medicaid Other | Attending: Emergency Medicine | Admitting: Emergency Medicine

## 2016-01-04 ENCOUNTER — Encounter (HOSPITAL_COMMUNITY): Payer: Self-pay | Admitting: Emergency Medicine

## 2016-01-04 DIAGNOSIS — S299XXA Unspecified injury of thorax, initial encounter: Secondary | ICD-10-CM | POA: Insufficient documentation

## 2016-01-04 DIAGNOSIS — M542 Cervicalgia: Secondary | ICD-10-CM | POA: Insufficient documentation

## 2016-01-04 DIAGNOSIS — M7918 Myalgia, other site: Secondary | ICD-10-CM

## 2016-01-04 DIAGNOSIS — Y999 Unspecified external cause status: Secondary | ICD-10-CM | POA: Insufficient documentation

## 2016-01-04 DIAGNOSIS — F17210 Nicotine dependence, cigarettes, uncomplicated: Secondary | ICD-10-CM | POA: Insufficient documentation

## 2016-01-04 DIAGNOSIS — J45909 Unspecified asthma, uncomplicated: Secondary | ICD-10-CM | POA: Insufficient documentation

## 2016-01-04 DIAGNOSIS — Y939 Activity, unspecified: Secondary | ICD-10-CM | POA: Insufficient documentation

## 2016-01-04 DIAGNOSIS — Y92410 Unspecified street and highway as the place of occurrence of the external cause: Secondary | ICD-10-CM | POA: Insufficient documentation

## 2016-01-04 MED ORDER — ACETAMINOPHEN 500 MG PO TABS
1000.0000 mg | ORAL_TABLET | Freq: Once | ORAL | Status: AC
  Administered 2016-01-04: 1000 mg via ORAL
  Filled 2016-01-04: qty 2
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
## 2016-01-04 NOTE — ED Notes (Signed)
Patient states she has to leave, advised he could not stay. PA made aware, PA at bedside speaking with patient.
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
## 2016-01-09 ENCOUNTER — Inpatient Hospital Stay (HOSPITAL_COMMUNITY)
Admission: EM | Admit: 2016-01-09 | Discharge: 2016-01-29 | DRG: 004 | Disposition: A | Discharge status: Home / Self Care | Payer: Self-pay | Attending: Pulmonary Disease | Admitting: Pulmonary Disease

## 2016-01-09 ENCOUNTER — Encounter (HOSPITAL_COMMUNITY): Payer: Self-pay | Admitting: Emergency Medicine

## 2016-01-09 ENCOUNTER — Emergency Department (HOSPITAL_COMMUNITY): Payer: Self-pay

## 2016-01-09 DIAGNOSIS — J9601 Acute respiratory failure with hypoxia: Secondary | ICD-10-CM

## 2016-01-09 DIAGNOSIS — K219 Gastro-esophageal reflux disease without esophagitis: Secondary | ICD-10-CM | POA: Diagnosis present

## 2016-01-09 DIAGNOSIS — R0902 Hypoxemia: Secondary | ICD-10-CM

## 2016-01-09 DIAGNOSIS — K5900 Constipation, unspecified: Secondary | ICD-10-CM | POA: Diagnosis not present

## 2016-01-09 DIAGNOSIS — Z4659 Encounter for fitting and adjustment of other gastrointestinal appliance and device: Secondary | ICD-10-CM

## 2016-01-09 DIAGNOSIS — Z9114 Patient's other noncompliance with medication regimen: Secondary | ICD-10-CM

## 2016-01-09 DIAGNOSIS — J45902 Unspecified asthma with status asthmaticus: Principal | ICD-10-CM | POA: Diagnosis present

## 2016-01-09 DIAGNOSIS — J45901 Unspecified asthma with (acute) exacerbation: Secondary | ICD-10-CM

## 2016-01-09 DIAGNOSIS — G9341 Metabolic encephalopathy: Secondary | ICD-10-CM | POA: Diagnosis present

## 2016-01-09 DIAGNOSIS — Z978 Presence of other specified devices: Secondary | ICD-10-CM

## 2016-01-09 DIAGNOSIS — R739 Hyperglycemia, unspecified: Secondary | ICD-10-CM | POA: Diagnosis not present

## 2016-01-09 DIAGNOSIS — Z9119 Patient's noncompliance with other medical treatment and regimen: Secondary | ICD-10-CM

## 2016-01-09 DIAGNOSIS — E872 Acidosis: Secondary | ICD-10-CM | POA: Diagnosis present

## 2016-01-09 DIAGNOSIS — I952 Hypotension due to drugs: Secondary | ICD-10-CM | POA: Diagnosis present

## 2016-01-09 DIAGNOSIS — R0603 Acute respiratory distress: Secondary | ICD-10-CM

## 2016-01-09 DIAGNOSIS — J40 Bronchitis, not specified as acute or chronic: Secondary | ICD-10-CM | POA: Diagnosis present

## 2016-01-09 DIAGNOSIS — T41295A Adverse effect of other general anesthetics, initial encounter: Secondary | ICD-10-CM | POA: Diagnosis present

## 2016-01-09 DIAGNOSIS — Z430 Encounter for attention to tracheostomy: Secondary | ICD-10-CM

## 2016-01-09 DIAGNOSIS — J9690 Respiratory failure, unspecified, unspecified whether with hypoxia or hypercapnia: Secondary | ICD-10-CM

## 2016-01-09 DIAGNOSIS — J15211 Pneumonia due to Methicillin susceptible Staphylococcus aureus: Secondary | ICD-10-CM | POA: Diagnosis present

## 2016-01-09 DIAGNOSIS — G7281 Critical illness myopathy: Secondary | ICD-10-CM | POA: Diagnosis present

## 2016-01-09 DIAGNOSIS — B9561 Methicillin susceptible Staphylococcus aureus infection as the cause of diseases classified elsewhere: Secondary | ICD-10-CM | POA: Diagnosis present

## 2016-01-09 DIAGNOSIS — J9602 Acute respiratory failure with hypercapnia: Secondary | ICD-10-CM | POA: Diagnosis present

## 2016-01-09 DIAGNOSIS — E876 Hypokalemia: Secondary | ICD-10-CM | POA: Diagnosis not present

## 2016-01-09 DIAGNOSIS — J45909 Unspecified asthma, uncomplicated: Secondary | ICD-10-CM

## 2016-01-09 DIAGNOSIS — J9600 Acute respiratory failure, unspecified whether with hypoxia or hypercapnia: Secondary | ICD-10-CM

## 2016-01-09 DIAGNOSIS — Z9289 Personal history of other medical treatment: Secondary | ICD-10-CM

## 2016-01-09 DIAGNOSIS — M6281 Muscle weakness (generalized): Secondary | ICD-10-CM

## 2016-01-09 DIAGNOSIS — F17210 Nicotine dependence, cigarettes, uncomplicated: Secondary | ICD-10-CM | POA: Diagnosis present

## 2016-01-09 LAB — CBC WITH DIFFERENTIAL/PLATELET
Basophils Absolute: 0 10*3/uL (ref 0.0–0.1)
Basophils Relative: 0 %
Eosinophils Absolute: 0.5 10*3/uL (ref 0.0–0.7)
Eosinophils Relative: 5 %
HEMATOCRIT: 52.4 % — AB (ref 39.0–52.0)
HEMOGLOBIN: 18 g/dL — AB (ref 13.0–17.0)
LYMPHS ABS: 2.8 10*3/uL (ref 0.7–4.0)
Lymphocytes Relative: 28 %
MCH: 31.1 pg (ref 26.0–34.0)
MCHC: 34.4 g/dL (ref 30.0–36.0)
MCV: 90.5 fL (ref 78.0–100.0)
MONOS PCT: 7 %
Monocytes Absolute: 0.7 10*3/uL (ref 0.1–1.0)
NEUTROS ABS: 6 10*3/uL (ref 1.7–7.7)
Neutrophils Relative %: 60 %
Platelets: 289 10*3/uL (ref 150–400)
RBC: 5.79 MIL/uL (ref 4.22–5.81)
RDW: 13.3 % (ref 11.5–15.5)
WBC: 9.9 10*3/uL (ref 4.0–10.5)

## 2016-01-09 LAB — BASIC METABOLIC PANEL
Anion gap: 8 (ref 5–15)
BUN: 12 mg/dL (ref 6–20)
CHLORIDE: 106 mmol/L (ref 101–111)
CO2: 26 mmol/L (ref 22–32)
CREATININE: 1.13 mg/dL (ref 0.61–1.24)
Calcium: 8.5 mg/dL — ABNORMAL LOW (ref 8.9–10.3)
GFR calc Af Amer: >60 mL/min (ref >60)
GFR calc non Af Amer: >60 mL/min (ref >60)
GLUCOSE: 143 mg/dL — AB (ref 65–99)
Potassium: 4 mmol/L (ref 3.5–5.1)
Sodium: 140 mmol/L (ref 135–145)

## 2016-01-09 MED ORDER — ROCURONIUM BROMIDE 50 MG/5ML IV SOLN
90.0000 mg | Freq: Once | INTRAVENOUS | Status: AC
  Administered 2016-01-09: 90 mg via INTRAVENOUS
  Filled 2016-01-09: qty 9

## 2016-01-09 MED ORDER — LORAZEPAM 2 MG/ML IJ SOLN
INTRAMUSCULAR | Status: AC
  Administered 2016-01-09: 2 mg via INTRAVENOUS
  Filled 2016-01-09: qty 1

## 2016-01-09 MED ORDER — METHYLPREDNISOLONE SODIUM SUCC 125 MG IJ SOLR
125.0000 mg | Freq: Once | INTRAMUSCULAR | Status: AC
  Administered 2016-01-09: 125 mg via INTRAVENOUS
  Filled 2016-01-09: qty 2

## 2016-01-09 MED ORDER — ALBUTEROL SULFATE (2.5 MG/3ML) 0.083% IN NEBU
5.0000 mg | INHALATION_SOLUTION | Freq: Once | RESPIRATORY_TRACT | Status: AC
  Administered 2016-01-09: 5 mg via RESPIRATORY_TRACT
  Filled 2016-01-09: qty 6

## 2016-01-09 MED ORDER — LORAZEPAM 2 MG/ML IJ SOLN
2.0000 mg | Freq: Once | INTRAMUSCULAR | Status: AC
  Administered 2016-01-09 (×2): 2 mg via INTRAVENOUS

## 2016-01-09 MED ORDER — IPRATROPIUM BROMIDE 0.02 % IN SOLN: 0.5000 mg | Freq: Once | RESPIRATORY_TRACT | Status: DC

## 2016-01-09 MED ORDER — PROPOFOL 1000 MG/100ML IV EMUL
5.0000 ug/kg/min | Freq: Once | INTRAVENOUS | Status: AC
  Administered 2016-01-10: 20 ug/kg/min via INTRAVENOUS
  Filled 2016-01-09: qty 100

## 2016-01-09 MED ORDER — SUCCINYLCHOLINE CHLORIDE 20 MG/ML IJ SOLN
INTRAMUSCULAR | Status: AC | PRN
  Administered 2016-01-09: 150 mg via INTRAVENOUS

## 2016-01-09 MED ORDER — ALBUTEROL (5 MG/ML) CONTINUOUS INHALATION SOLN
10.0000 mg/h | INHALATION_SOLUTION | RESPIRATORY_TRACT | Status: DC
  Administered 2016-01-09: 10 mg/h via RESPIRATORY_TRACT
  Filled 2016-01-09: qty 20

## 2016-01-09 MED ORDER — LORAZEPAM 2 MG/ML IJ SOLN: 2.0000 mg | Freq: Once | INTRAMUSCULAR | Status: DC

## 2016-01-09 MED ORDER — LORAZEPAM 2 MG/ML IJ SOLN
INTRAMUSCULAR | Status: AC
  Filled 2016-01-09: qty 1

## 2016-01-09 MED ORDER — ETOMIDATE 2 MG/ML IV SOLN
INTRAVENOUS | Status: AC | PRN
  Administered 2016-01-09: 30 mg via INTRAVENOUS

## 2016-01-09 MED ORDER — ROCURONIUM BROMIDE 50 MG/5ML IV SOLN: 1.0000 mg/kg | Freq: Once | INTRAVENOUS | Status: DC

## 2016-01-09 NOTE — ED Provider Notes (Signed)
[facility]-EMERGENCY DEPT Provider Note   CSN: [id number] Arrival date & time: 01/09/16  2114     History   Chief Complaint Chief Complaint  Patient presents with  . Respiratory Distress    HPI Henry Hardin is a 30 y.o. male.  HPI Henry Hardin is a 30 y.o. male with PMH significant for asthma requiring admission and intubation who presents with sudden onset severe respiratory distress that began at 7 PM tonight.  He states this feels like his asthma attacks.  He tried his albuterol inhaler at home without success.  En route with EMS he received 2g Mag, 10 mg albuterol, and 0.5 atrovent.    Past Medical History:  Diagnosis Date  . Asthma   . Pneumonia     Patient Active Problem List   Diagnosis Date Noted  . ALLERGIC RHINITIS 08/02/2009  . RESPIRATORY FAILURE, ACUTE 08/02/2009  . GERD 08/02/2009  . HYPERGLYCEMIA 08/02/2009    History reviewed. No pertinent surgical history.     Home Medications    Prior to Admission medications   Medication Sig Start Date End Date Taking? Authorizing Provider  acetaminophen (TYLENOL) 500 MG tablet Take 500-1,000 mg by mouth every 6 (six) hours as needed (for pain).   Yes Historical Provider, MD  albuterol (PROAIR HFA) 108 (90 BASE) MCG/ACT inhaler Inhale 2 puffs into the lungs every 6 (six) hours as needed for wheezing or shortness of breath. 12/08/14  Yes Fayrene Helper, PA-C  ibuprofen (ADVIL,MOTRIN) 200 MG tablet Take 200-800 mg by mouth every 6 (six) hours as needed (for pain).   Yes Historical Provider, MD  albuterol (PROVENTIL HFA;VENTOLIN HFA) 108 (90 Base) MCG/ACT inhaler Inhale 2 puffs into the lungs every 6 (six) hours as needed for wheezing or shortness of breath. Patient not taking: Reported on 10/04/2015 04/05/15   Darci Current, MD  cetirizine-pseudoephedrine (ZYRTEC-D) 5-120 MG tablet Take 1 tablet by mouth 2 (two) times daily. Patient not taking: Reported on 12/18/2015 11/06/15   Waylan Boga Law, PA-C  clindamycin  (CLEOCIN) 300 MG capsule Take 1 capsule (300 mg total) by mouth 3 (three) times daily. Patient not taking: Reported on 09/20/2015 07/05/15   Charm Rings, MD  diazepam (VALIUM) 5 MG tablet Take 1 tablet (5 mg total) by mouth every 8 (eight) hours as needed for anxiety. Patient not taking: Reported on 09/20/2015 04/07/15   Emily Filbert, MD  guaiFENesin (ROBITUSSIN) 100 MG/5ML liquid Take 5-10 mLs (100-200 mg total) by mouth every 4 (four) hours as needed for cough. Patient not taking: Reported on 12/18/2015 11/06/15   Waylan Boga Law, PA-C  guaiFENesin-codeine 100-10 MG/5ML syrup Take 5-10 mLs by mouth every 6 (six) hours as needed for cough. Patient not taking: Reported on 09/20/2015 02/07/15   Arthor Captain, PA-C  HYDROcodone-acetaminophen (NORCO) 5-325 MG tablet Take 1 tablet by mouth every 6 (six) hours as needed for moderate pain. Patient not taking: Reported on 09/20/2015 07/05/15   Charm Rings, MD  predniSONE (DELTASONE) 20 MG tablet Take 2 tablets (40 mg total) by mouth daily. Patient not taking: Reported on 01/09/2016 12/18/15   Cheri Fowler, PA-C    Family History History reviewed. No pertinent family history.  Social History Social History  Substance Use Topics  . Smoking status: Current Every Day Smoker    Packs/day: 0.75    Types: Cigarettes  . Smokeless tobacco: Never Used  . Alcohol use Yes     Comment: Once every two months  Allergies   Bee venom   Review of Systems Review of Systems All other systems negative unless otherwise stated in HPI   Physical Exam Updated Vital Signs BP 123/94   Pulse (!) 123   Temp 98.7 F (37.1 C) (Oral)   Resp 21   Ht 6\' 1"  (1.854 m)   Wt 86.2 kg   SpO2 96%   BMI 25.07 kg/m   Physical Exam  Constitutional: He is oriented to person, place, and time. He appears well-developed and well-nourished. He appears distressed.  HENT:  Head: Normocephalic and atraumatic.  Mouth/Throat: Oropharynx is clear and moist.  Eyes:  Conjunctivae are normal.  Neck: Normal range of motion. Neck supple.  Cardiovascular: Regular rhythm.  Tachycardia present.   Lower extremity symmetric.  Pulmonary/Chest: Accessory muscle usage present. No stridor. Tachypnea noted. He is in respiratory distress. He has wheezes. He has no rhonchi. He has no rales.  Patient only able to speak in 1-2 word sentences.   Abdominal: Soft. Bowel sounds are normal. He exhibits no distension. There is no tenderness.  Musculoskeletal: Normal range of motion.  Lymphadenopathy:    He has no cervical adenopathy.  Neurological: He is alert and oriented to person, place, and time.  Speech clear without dysarthria.  Skin: Skin is warm. He is diaphoretic.  Psychiatric: He has a normal mood and affect. His behavior is normal.     ED Treatments / Results  Labs (all labs ordered are listed, but only abnormal results are displayed) Labs Reviewed  CBC WITH DIFFERENTIAL/PLATELET - Abnormal; Notable for the following:       Result Value   Hemoglobin 18.0 (*)    HCT 52.4 (*)    All other components within normal limits  BASIC METABOLIC PANEL - Abnormal; Notable for the following:    Glucose, Bld 143 (*)    Calcium 8.5 (*)    All other components within normal limits  BLOOD GAS, ARTERIAL - Abnormal; Notable for the following:    pH, Arterial 7.167 (*)    pCO2 arterial 80.9 (*)    Bicarbonate 28.2 (*)    Acid-base deficit 3.7 (*)    All other components within normal limits  BLOOD GAS, ARTERIAL    EKG  EKG Interpretation  Date/Time:  Wednesday January 09 2016 21:16:04 EST Ventricular Rate:  114 PR Interval:    QRS Duration: 134 QT Interval:  325 QTC Calculation: 448 R Axis:   -61 Text Interpretation:  Sinus tachycardia Atrial premature complex Right atrial enlargement Nonspecific IVCD with LAD ST elev, probable normal early repol pattern SINCE LAST TRACING HEART RATE HAS INCREASED Confirmed by Ethelda ChickJACUBOWITZ  MD, SAM [phone]([id number]) on 01/09/2016 9:19:06  PM       Radiology Dg Chest 2 View  Result Date: 01/09/2016 CLINICAL DATA:  Acute onset of shortness of breath and wheezing. Initial encounter. EXAM: CHEST  2 VIEW COMPARISON:  Chest radiograph from 12/18/2015 FINDINGS: The lungs are well-aerated and clear. There is no evidence of focal opacification, pleural effusion or pneumothorax. The heart is normal in size; the mediastinal contour is within normal limits. No acute osseous abnormalities are seen. IMPRESSION: No acute cardiopulmonary process seen. Electronically Signed   By: Roanna RaiderJeffery  Chang M.D.   On: 01/09/2016 22:04   Dg Chest Portable 1 View  Result Date: 01/09/2016 CLINICAL DATA:  30 y/o  M; post intubation. EXAM: PORTABLE CHEST 1 VIEW COMPARISON:  01/09/2016 chest radiograph. FINDINGS: Endotracheal tube tip approximately 4.5 cm from carina. Enteric tube tip below  the field of view within the abdomen with proximal side hole at the gastroesophageal junction. Clear lungs. Normal cardiac silhouette. No effusion or pneumothorax. No acute osseous abnormality is evident. IMPRESSION: No acute pulmonary process identified. Endotracheal tube 4.5 cm from carina. Enteric tube tip below the field of view in the abdomen. Electronically Signed   By: Mitzi HansenLance  Furusawa-Stratton M.D.   On: 01/09/2016 23:42    Procedures Procedures (including critical care time)  CRITICAL CARE Performed by: Cheri FowlerKayla Denzel Etienne   Total critical care time: 30 minutes  Critical care time was exclusive of separately billable procedures and treating other patients.  Critical care was necessary to treat or prevent imminent or life-threatening deterioration.  Critical care was time spent personally by me on the following activities: development of treatment plan with patient and/or surrogate as well as nursing, discussions with consultants, evaluation of patient's response to treatment, examination of patient, obtaining history from patient or surrogate, ordering and performing  treatments and interventions, ordering and review of laboratory studies, ordering and review of radiographic studies, pulse oximetry and re-evaluation of patient's condition.   Medications Ordered in ED Medications  albuterol (PROVENTIL,VENTOLIN) solution continuous neb (10 mg/hr Nebulization New Bag/Given 01/09/16 2211)  ipratropium (ATROVENT) nebulizer solution 0.5 mg (0.5 mg Nebulization Not Given 01/09/16 2245)  LORazepam (ATIVAN) 2 MG/ML injection (not administered)  LORazepam (ATIVAN) injection 2 mg (not administered)  albuterol (PROVENTIL) (2.5 MG/3ML) 0.083% nebulizer solution 5 mg (5 mg Nebulization Given 01/09/16 2201)  methylPREDNISolone sodium succinate (SOLU-MEDROL) 125 mg/2 mL injection 125 mg (125 mg Intravenous Given 01/09/16 2242)  succinylcholine (ANECTINE) injection (150 mg Intravenous Given 01/09/16 2304)  etomidate (AMIDATE) injection (30 mg Intravenous Given 01/09/16 2305)  LORazepam (ATIVAN) injection 2 mg (2 mg Intravenous Given 01/09/16 2345)  rocuronium (ZEMURON) injection 90 mg (90 mg Intravenous Given 01/09/16 2343)  propofol (DIPRIVAN) 1000 MG/100ML infusion (40 mcg/kg/min  86.2 kg Intravenous Rate/Dose Change 01/10/16 0044)     Initial Impression / Assessment and Plan / ED Course  I have reviewed the triage vital signs and the nursing notes.  Pertinent labs & imaging results that were available during my care of the patient were reviewed by me and considered in my medical decision making (see chart for details).  Clinical Course    Patient presents with severe asthma exacerbation. On initial exam, patient is tachypneic and in respiratory distress with accessory muscle usage. Lungs with diffuse wheezes. He is only able to speak in 1-2 word sentences. Case was discussed with Dr. Rennis ChrisJacobowitz. Shortly thereafter, patient deteriorated and required intubation. Patient was then admitted to ICU for further management.   Final Clinical Impressions(s) / ED Diagnoses     Final diagnoses:  Acute respiratory failure with hypercapnia (HCC)  Respiratory distress  Exacerbation of asthma, unspecified asthma severity, unspecified whether persistent    New Prescriptions New Prescriptions   No medications on file     Cheri FowlerKayla Rebekka Lobello, PA-C 01/10/16 0053    Doug SouSam Jacubowitz, MD 01/10/16 [phone]

## 2016-01-09 NOTE — ED Provider Notes (Signed)
Level V caveat patient rest for distress. Patient have an asthma attack onset earlier today as described by his fiance. Upon my entry to the room patient was somnolent, diaphoretic severe respiratory distress. Lungs with prolonged x-ray phase and extra wheezes. He became unconscious in my presence. He was moved to the resuscitation room. Patient in rest for failure. He was intubated by me at 11 PM. INTUBATION Performed by: Doug SouJACUBOWITZ,Calieb Lichtman  Required items: required blood products, implants, devices, and special equipment available Patient identity confirmed: provided demographic data and hospital-assigned identification number Time out: Immediately prior to procedure a "time out" was called to verify the correct patient, procedure, equipment, support staff and site/side marked as required.  Indications: Acute respiratory failure   Intubation method: Direct laryngoscopy   Preoxygenation: BVM  Sedatives: 30 mg IV Etomidate Paralytic: 150 mg IV Succinylcholine  Tube Size: 7.5 cuffed  Post-procedure assessment: chest rise and ETCO2 monitor Breath sounds: equal and absent over the epigastrium Tube secured with: ETT holder Chest x-ray interpreted by radiologist and me.  Chest x-ray findings: endotracheal tube in appropriate position  Patient tolerated the procedure well with no immediate complications.      Doug SouSam Lindzy Rupert, MD 01/10/16 [phone]

## 2016-01-09 NOTE — ED Triage Notes (Signed)
Patient was in tripod position when EMS arrived. Patient is wheezing all throughout per EMS. Patient was given 10 mg of albuterol, 0.5 atrovent, 2g of Mag. Patient is still having trouble breathing. Patient gave Plasma today.

## 2016-01-09 NOTE — Progress Notes (Signed)
eLink Physician-Brief Progress Note Patient Name: Henry Hardin DOB: 08/02/1985 MRN: [id number]   Date of Service  01/09/2016  HPI/Events of Note  30 yo with acute res[p failure from acute ASTHMA exacerbation   eICU Interventions  Plan: Admit to ICU, vent support, IV steroids and aggressive BD therapy     Intervention Category Evaluation Type: New Patient Evaluation  Erin FullingKurian Nile Dorning 01/09/2016, 11:46 PM

## 2016-01-09 NOTE — ED Notes (Signed)
Patient states he is feeling like his about to pass out. Patient vital signs are normal.

## 2016-01-10 DIAGNOSIS — J45901 Unspecified asthma with (acute) exacerbation: Secondary | ICD-10-CM

## 2016-01-10 DIAGNOSIS — J45909 Unspecified asthma, uncomplicated: Secondary | ICD-10-CM

## 2016-01-10 DIAGNOSIS — J9602 Acute respiratory failure with hypercapnia: Secondary | ICD-10-CM

## 2016-01-10 DIAGNOSIS — J45902 Unspecified asthma with status asthmaticus: Secondary | ICD-10-CM | POA: Diagnosis present

## 2016-01-10 DIAGNOSIS — J4552 Severe persistent asthma with status asthmaticus: Secondary | ICD-10-CM

## 2016-01-10 LAB — RAPID URINE DRUG SCREEN, HOSP PERFORMED
Amphetamines: NOT DETECTED
BARBITURATES: NOT DETECTED
Benzodiazepines: POSITIVE — AB
Cocaine: NOT DETECTED
Opiates: NOT DETECTED
Tetrahydrocannabinol: NOT DETECTED

## 2016-01-10 LAB — BLOOD GAS, ARTERIAL
ACID-BASE EXCESS: 1 mmol/L (ref 0.0–2.0)
ACID-BASE EXCESS: 1.1 mmol/L (ref 0.0–2.0)
Acid-base deficit: 0.8 mmol/L (ref 0.0–2.0)
Acid-base deficit: 0.8 mmol/L (ref 0.0–2.0)
Acid-base deficit: 1.3 mmol/L (ref 0.0–2.0)
Acid-base deficit: 3.7 mmol/L — ABNORMAL HIGH (ref 0.0–2.0)
BICARBONATE: 24.6 mmol/L (ref 20.0–28.0)
BICARBONATE: 26.6 mmol/L (ref 20.0–28.0)
BICARBONATE: 28.5 mmol/L — AB (ref 20.0–28.0)
BICARBONATE: 28.4 mmol/L — AB (ref 20.0–28.0)
BICARBONATE: 28.2 mmol/L — AB (ref 20.0–28.0)
Bicarbonate: 27.3 mmol/L (ref 20.0–28.0)
DRAWN BY: 244871
Drawn by: 11249
Drawn by: 308601
Drawn by: 331761
Drawn by: 39899
Drawn by: 46203
FIO2: 100
FIO2: 30
FIO2: 30
FIO2: 50
FIO2: 50
FIO2: 50
LHR: 14 {breaths}/min
MECHVT: 640 mL
MECHVT: 640 mL
MECHVT: 640 mL
MECHVT: 640 mL
O2 Saturation: 90.4 %
O2 Saturation: 91.2 %
O2 Saturation: 94.6 %
O2 Saturation: 97.6 %
O2 Saturation: 98.9 %
O2 Saturation: 99.3 %
PATIENT TEMPERATURE: 98.6
PATIENT TEMPERATURE: 98.6
PCO2 ART: 60 mmHg — AB (ref 32.0–48.0)
PCO2 ART: 78.7 mmHg — AB (ref 32.0–48.0)
PCO2 ART: 75.4 mmHg — AB (ref 32.0–48.0)
PEEP/CPAP: 5 cmH2O
PEEP/CPAP: 5 cmH2O
PEEP/CPAP: 5 cmH2O
PEEP: 5 cmH2O
PEEP: 5 cmH2O
PEEP: 5 cmH2O
PH ART: 7.155 — AB (ref 7.350–7.450)
PH ART: 7.178 — AB (ref 7.350–7.450)
PH ART: 7.201 — AB (ref 7.350–7.450)
PO2 ART: 72.2 mmHg — AB (ref 83.0–108.0)
PO2 ART: 68.1 mmHg — AB (ref 83.0–108.0)
PO2 ART: 89.9 mmHg (ref 83.0–108.0)
Patient temperature: 100.2
Patient temperature: 37.1
Patient temperature: 98.6
Patient temperature: 98.6
RATE: 10 resp/min
RATE: 10 resp/min
RATE: 14 resp/min
RATE: 14 resp/min
RATE: 17 resp/min
VT: 640 mL
VT: 640 mL
pCO2 arterial: 50.8 mmHg — ABNORMAL HIGH (ref 32.0–48.0)
pCO2 arterial: 80.9 mmHg (ref 32.0–48.0)
pCO2 arterial: 81.1 mmHg (ref 32.0–48.0)
pH, Arterial: 7.167 — CL (ref 7.350–7.450)
pH, Arterial: 7.281 — ABNORMAL LOW (ref 7.350–7.450)
pH, Arterial: 7.307 — ABNORMAL LOW (ref 7.350–7.450)
pO2, Arterial: 124 mmHg — ABNORMAL HIGH (ref 83.0–108.0)
pO2, Arterial: 174 mmHg — ABNORMAL HIGH (ref 83.0–108.0)

## 2016-01-10 LAB — TROPONIN I

## 2016-01-10 LAB — GLUCOSE, CAPILLARY
GLUCOSE-CAPILLARY: 243 mg/dL — AB (ref 65–99)
GLUCOSE-CAPILLARY: 214 mg/dL — AB (ref 65–99)
Glucose-Capillary: 257 mg/dL — ABNORMAL HIGH (ref 65–99)

## 2016-01-10 LAB — TRIGLYCERIDES
TRIGLYCERIDES: 54 mg/dL (ref <150)
Triglycerides: 20 mg/dL (ref <150)

## 2016-01-10 LAB — PHOSPHORUS
PHOSPHORUS: 3.1 mg/dL (ref 2.5–4.6)
Phosphorus: 3.4 mg/dL (ref 2.5–4.6)

## 2016-01-10 LAB — MRSA PCR SCREENING: MRSA BY PCR: NEGATIVE

## 2016-01-10 LAB — MAGNESIUM
MAGNESIUM: 2.1 mg/dL (ref 1.7–2.4)
Magnesium: 2 mg/dL (ref 1.7–2.4)

## 2016-01-10 LAB — ETHANOL

## 2016-01-10 LAB — PROCALCITONIN: PROCALCITONIN: 2.89 ng/mL

## 2016-01-10 MED ORDER — IPRATROPIUM-ALBUTEROL 0.5-2.5 (3) MG/3ML IN SOLN
3.0000 mL | Freq: Four times a day (QID) | RESPIRATORY_TRACT | Status: DC
  Administered 2016-01-10 (×2): 3 mL via RESPIRATORY_TRACT
  Filled 2016-01-10 (×2): qty 3

## 2016-01-10 MED ORDER — PROPOFOL 1000 MG/100ML IV EMUL
0.0000 ug/kg/min | INTRAVENOUS | Status: DC
  Administered 2016-01-10: 20 ug/kg/min via INTRAVENOUS
  Administered 2016-01-10: 30 ug/kg/min via INTRAVENOUS
  Administered 2016-01-10 – 2016-01-15 (×25): 50 ug/kg/min via INTRAVENOUS
  Administered 2016-01-15: 40 ug/kg/min via INTRAVENOUS
  Administered 2016-01-16 (×5): 50 ug/kg/min via INTRAVENOUS
  Filled 2016-01-10 (×26): qty 100
  Filled 2016-01-10: qty 200
  Filled 2016-01-10 (×10): qty 100

## 2016-01-10 MED ORDER — ARFORMOTEROL TARTRATE 15 MCG/2ML IN NEBU
15.0000 ug | INHALATION_SOLUTION | Freq: Two times a day (BID) | RESPIRATORY_TRACT | Status: DC
  Administered 2016-01-10 – 2016-01-26 (×33): 15 ug via RESPIRATORY_TRACT
  Filled 2016-01-10 (×33): qty 2

## 2016-01-10 MED ORDER — PRO-STAT SUGAR FREE PO LIQD
30.0000 mL | Freq: Two times a day (BID) | ORAL | Status: DC
  Administered 2016-01-10 – 2016-01-14 (×7): 30 mL
  Filled 2016-01-10 (×8): qty 30

## 2016-01-10 MED ORDER — METHYLPREDNISOLONE SODIUM SUCC 125 MG IJ SOLR
60.0000 mg | Freq: Four times a day (QID) | INTRAMUSCULAR | Status: DC
  Administered 2016-01-10 – 2016-01-17 (×27): 60 mg via INTRAVENOUS
  Filled 2016-01-10 (×27): qty 2

## 2016-01-10 MED ORDER — FENTANYL CITRATE (PF) 100 MCG/2ML IJ SOLN
50.0000 ug | Freq: Once | INTRAMUSCULAR | Status: AC
  Administered 2016-01-10: 50 ug via INTRAVENOUS

## 2016-01-10 MED ORDER — PHENYLEPHRINE HCL 10 MG/ML IJ SOLN
0.0000 ug/min | INTRAVENOUS | Status: DC
  Administered 2016-01-10: 95 ug/min via INTRAVENOUS
  Administered 2016-01-10: 90 ug/min via INTRAVENOUS
  Administered 2016-01-10: 95 ug/min via INTRAVENOUS
  Administered 2016-01-10: 90 ug/min via INTRAVENOUS
  Administered 2016-01-10: 70 ug/min via INTRAVENOUS
  Administered 2016-01-10: 20 ug/min via INTRAVENOUS
  Administered 2016-01-10 (×2): 90 ug/min via INTRAVENOUS
  Administered 2016-01-11: 10 ug/min via INTRAVENOUS
  Administered 2016-01-11: 35 ug/min via INTRAVENOUS
  Filled 2016-01-10 (×9): qty 1

## 2016-01-10 MED ORDER — FENTANYL CITRATE (PF) 100 MCG/2ML IJ SOLN
100.0000 ug | INTRAMUSCULAR | Status: DC | PRN
  Administered 2016-01-10: 100 ug via INTRAVENOUS
  Filled 2016-01-10 (×2): qty 2

## 2016-01-10 MED ORDER — SODIUM CHLORIDE 0.9 % IV BOLUS (SEPSIS)
1000.0000 mL | Freq: Once | INTRAVENOUS | Status: AC
Start: 2016-01-10 — End: 2016-01-10
  Administered 2016-01-10: 1000 mL via INTRAVENOUS

## 2016-01-10 MED ORDER — FENTANYL 2500MCG IN NS 250ML (10MCG/ML) PREMIX INFUSION
25.0000 ug/h | INTRAVENOUS | Status: DC
  Administered 2016-01-10: 100 ug/h via INTRAVENOUS
  Administered 2016-01-10 – 2016-01-15 (×19): 400 ug/h via INTRAVENOUS
  Administered 2016-01-15: 300 ug/h via INTRAVENOUS
  Administered 2016-01-15 – 2016-01-16 (×2): 400 ug/h via INTRAVENOUS
  Administered 2016-01-16 (×2): 350 ug/h via INTRAVENOUS
  Filled 2016-01-10 (×26): qty 250

## 2016-01-10 MED ORDER — HEPARIN SODIUM (PORCINE) 5000 UNIT/ML IJ SOLN
5000.0000 [IU] | Freq: Three times a day (TID) | INTRAMUSCULAR | Status: DC
  Administered 2016-01-10 – 2016-01-29 (×58): 5000 [IU] via SUBCUTANEOUS
  Filled 2016-01-10 (×59): qty 1

## 2016-01-10 MED ORDER — ORAL CARE MOUTH RINSE
15.0000 mL | OROMUCOSAL | Status: DC
  Administered 2016-01-10 – 2016-01-26 (×161): 15 mL via OROMUCOSAL

## 2016-01-10 MED ORDER — DOCUSATE SODIUM 50 MG/5ML PO LIQD
100.0000 mg | Freq: Two times a day (BID) | ORAL | Status: DC | PRN
  Administered 2016-01-12 – 2016-01-15 (×3): 100 mg
  Filled 2016-01-10 (×4): qty 10

## 2016-01-10 MED ORDER — SODIUM CHLORIDE 0.9 % IV SOLN
INTRAVENOUS | Status: DC
  Administered 2016-01-10 – 2016-01-13 (×5): via INTRAVENOUS

## 2016-01-10 MED ORDER — FENTANYL BOLUS VIA INFUSION
50.0000 ug | INTRAVENOUS | Status: DC | PRN
  Administered 2016-01-10 – 2016-01-15 (×4): 50 ug via INTRAVENOUS
  Filled 2016-01-10: qty 50

## 2016-01-10 MED ORDER — AZITHROMYCIN 250 MG PO TABS
250.0000 mg | ORAL_TABLET | Freq: Every day | ORAL | Status: DC
  Administered 2016-01-11 – 2016-01-12 (×2): 250 mg via ORAL
  Filled 2016-01-10 (×3): qty 1

## 2016-01-10 MED ORDER — MIDAZOLAM HCL 2 MG/2ML IJ SOLN
2.0000 mg | INTRAMUSCULAR | Status: DC | PRN
  Administered 2016-01-10: 2 mg via INTRAVENOUS
  Filled 2016-01-10: qty 2

## 2016-01-10 MED ORDER — CHLORHEXIDINE GLUCONATE 0.12% ORAL RINSE (MEDLINE KIT)
15.0000 mL | Freq: Two times a day (BID) | OROMUCOSAL | Status: DC
Start: 2016-01-10 — End: 2016-01-29
  Administered 2016-01-10 – 2016-01-29 (×37): 15 mL via OROMUCOSAL

## 2016-01-10 MED ORDER — IPRATROPIUM-ALBUTEROL 0.5-2.5 (3) MG/3ML IN SOLN
3.0000 mL | RESPIRATORY_TRACT | Status: DC
  Administered 2016-01-10 – 2016-01-12 (×11): 3 mL via RESPIRATORY_TRACT
  Filled 2016-01-10 (×11): qty 3

## 2016-01-10 MED ORDER — MAGNESIUM SULFATE 2 GM/50ML IV SOLN
2.0000 g | Freq: Once | INTRAVENOUS | Status: AC
  Administered 2016-01-10: 2 g via INTRAVENOUS
  Filled 2016-01-10: qty 50

## 2016-01-10 MED ORDER — AZITHROMYCIN 500 MG PO TABS
500.0000 mg | ORAL_TABLET | Freq: Every day | ORAL | Status: AC
  Administered 2016-01-10: 500 mg via ORAL
  Filled 2016-01-10: qty 1

## 2016-01-10 MED ORDER — METHYLPREDNISOLONE SODIUM SUCC 125 MG IJ SOLR
125.0000 mg | Freq: Four times a day (QID) | INTRAMUSCULAR | Status: DC
  Administered 2016-01-10 (×2): 125 mg via INTRAVENOUS
  Filled 2016-01-10 (×2): qty 2

## 2016-01-10 MED ORDER — PROPOFOL 1000 MG/100ML IV EMUL: 0.0000 ug/kg/min | INTRAVENOUS | Status: DC

## 2016-01-10 MED ORDER — VITAL HIGH PROTEIN PO LIQD
1000.0000 mL | ORAL | Status: DC
  Administered 2016-01-10: 1000 mL
  Administered 2016-01-11: 09:00:00
  Administered 2016-01-11: 1000 mL

## 2016-01-10 MED ORDER — MIDAZOLAM HCL 2 MG/2ML IJ SOLN
2.0000 mg | INTRAMUSCULAR | Status: AC | PRN
  Administered 2016-01-10 (×3): 2 mg via INTRAVENOUS
  Filled 2016-01-10 (×4): qty 2

## 2016-01-10 MED ORDER — VECURONIUM BROMIDE 10 MG IV SOLR
10.0000 mg | INTRAVENOUS | Status: DC | PRN
  Administered 2016-01-10: 10 mg via INTRAVENOUS
  Filled 2016-01-10: qty 10

## 2016-01-10 MED ORDER — BUDESONIDE 0.5 MG/2ML IN SUSP
0.5000 mg | Freq: Two times a day (BID) | RESPIRATORY_TRACT | Status: DC
  Administered 2016-01-10 – 2016-01-13 (×6): 0.5 mg via RESPIRATORY_TRACT
  Filled 2016-01-10 (×6): qty 2

## 2016-01-10 MED ORDER — ALBUTEROL SULFATE (2.5 MG/3ML) 0.083% IN NEBU
2.5000 mg | INHALATION_SOLUTION | RESPIRATORY_TRACT | Status: DC | PRN
  Administered 2016-01-11 (×2): 2.5 mg via RESPIRATORY_TRACT
  Administered 2016-01-12 – 2016-01-13 (×2): 5 mg via RESPIRATORY_TRACT
  Administered 2016-01-13: 2.5 mg via RESPIRATORY_TRACT
  Filled 2016-01-10 (×8): qty 3

## 2016-01-10 MED ORDER — FAMOTIDINE IN NACL 20-0.9 MG/50ML-% IV SOLN
20.0000 mg | Freq: Two times a day (BID) | INTRAVENOUS | Status: DC
  Administered 2016-01-10 – 2016-01-18 (×18): 20 mg via INTRAVENOUS
  Filled 2016-01-10 (×18): qty 50

## 2016-01-10 MED ORDER — SODIUM CHLORIDE 0.9 % IV SOLN
250.0000 mL | INTRAVENOUS | Status: DC | PRN
  Administered 2016-01-20 – 2016-01-23 (×3): 250 mL via INTRAVENOUS
  Administered 2016-01-26: 500 mL via INTRAVENOUS

## 2016-01-10 MED ORDER — SODIUM BICARBONATE 8.4 % IV SOLN
INTRAVENOUS | Status: DC
  Administered 2016-01-10 – 2016-01-12 (×6): via INTRAVENOUS
  Filled 2016-01-10 (×7): qty 150

## 2016-01-10 MED ORDER — INFLUENZA VAC SPLIT QUAD 0.5 ML IM SUSY: 0.5000 mL | PREFILLED_SYRINGE | INTRAMUSCULAR | Status: DC | PRN

## 2016-01-10 MED ORDER — MIDAZOLAM HCL 2 MG/2ML IJ SOLN
1.0000 mg | INTRAMUSCULAR | Status: DC | PRN
  Administered 2016-01-10 (×2): 4 mg via INTRAVENOUS
  Administered 2016-01-11: 2 mg via INTRAVENOUS
  Administered 2016-01-11: 4 mg via INTRAVENOUS
  Administered 2016-01-15: 2 mg via INTRAVENOUS
  Administered 2016-01-15 – 2016-01-19 (×10): 4 mg via INTRAVENOUS
  Filled 2016-01-10 (×4): qty 4
  Filled 2016-01-10 (×2): qty 2
  Filled 2016-01-10: qty 4
  Filled 2016-01-10 (×2): qty 2
  Filled 2016-01-10 (×4): qty 4
  Filled 2016-01-10: qty 2
  Filled 2016-01-10: qty 4
  Filled 2016-01-10: qty 2
  Filled 2016-01-10 (×3): qty 4

## 2016-01-10 MED ORDER — INSULIN ASPART 100 UNIT/ML ~~LOC~~ SOLN
2.0000 [IU] | SUBCUTANEOUS | Status: DC
  Administered 2016-01-10 – 2016-01-11 (×3): 6 [IU] via SUBCUTANEOUS
  Administered 2016-01-11: 4 [IU] via SUBCUTANEOUS

## 2016-01-10 NOTE — ED Notes (Signed)
Patient is awake and agitated-fentanyl 100 mcg administered IV per prn order

## 2016-01-10 NOTE — H&P (Signed)
PULMONARY / CRITICAL CARE MEDICINE   Name: Henry Hardin MRN: [id number] DOB: 1985-12-19    ADMISSION DATE:  01/09/2016 CONSULTATION DATE:  01/10/2016  REFERRING MD:  Dr. Ethelda Chick  CHIEF COMPLAINT:  SOB  HISTORY OF PRESENT ILLNESS:   30 year old male with PMH asthma since childhood. He has never been consistent with taking his controller medications and has been unable to get albuterol due to insurance issues per mother. He has been smoking about a pack per day for the past 6 years during which time his symptoms have gotten worse. He has been seen in the ED 8 times in the past 6 months with exacerbations of asthma. He has been intubated once in the past. 11/22 he presented to the ED with CC respiratory distress for about 3 hours. He was treated with mag, albuterol, and atrovent by EMS. He was additionally treated with solumedrol, and additional bronchodilators in ED without improvement. His mental status began to worsen and he required intubation. PCCM asked to admit.   PAST MEDICAL HISTORY :  He  has a past medical history of Asthma and Pneumonia.  PAST SURGICAL HISTORY: He  has no past surgical history on file.  Allergies  Allergen Reactions  . Bee Venom Anaphylaxis, Itching and Swelling    No current facility-administered medications on file prior to encounter.    Current Outpatient Prescriptions on File Prior to Encounter  Medication Sig  . acetaminophen (TYLENOL) 500 MG tablet Take 500-1,000 mg by mouth every 6 (six) hours as needed (for pain).  Marland Kitchen albuterol (PROAIR HFA) 108 (90 BASE) MCG/ACT inhaler Inhale 2 puffs into the lungs every 6 (six) hours as needed for wheezing or shortness of breath.  Marland Kitchen ibuprofen (ADVIL,MOTRIN) 200 MG tablet Take 200-800 mg by mouth every 6 (six) hours as needed (for pain).  Marland Kitchen albuterol (PROVENTIL HFA;VENTOLIN HFA) 108 (90 Base) MCG/ACT inhaler Inhale 2 puffs into the lungs every 6 (six) hours as needed for wheezing or shortness of breath. (Patient  not taking: Reported on 10/04/2015)  . cetirizine-pseudoephedrine (ZYRTEC-D) 5-120 MG tablet Take 1 tablet by mouth 2 (two) times daily. (Patient not taking: Reported on 12/18/2015)  . clindamycin (CLEOCIN) 300 MG capsule Take 1 capsule (300 mg total) by mouth 3 (three) times daily. (Patient not taking: Reported on 09/20/2015)  . diazepam (VALIUM) 5 MG tablet Take 1 tablet (5 mg total) by mouth every 8 (eight) hours as needed for anxiety. (Patient not taking: Reported on 09/20/2015)  . guaiFENesin (ROBITUSSIN) 100 MG/5ML liquid Take 5-10 mLs (100-200 mg total) by mouth every 4 (four) hours as needed for cough. (Patient not taking: Reported on 12/18/2015)  . guaiFENesin-codeine 100-10 MG/5ML syrup Take 5-10 mLs by mouth every 6 (six) hours as needed for cough. (Patient not taking: Reported on 09/20/2015)  . HYDROcodone-acetaminophen (NORCO) 5-325 MG tablet Take 1 tablet by mouth every 6 (six) hours as needed for moderate pain. (Patient not taking: Reported on 09/20/2015)  . predniSONE (DELTASONE) 20 MG tablet Take 2 tablets (40 mg total) by mouth daily. (Patient not taking: Reported on 01/09/2016)    FAMILY HISTORY:  His has no family status information on file.    SOCIAL HISTORY: He  reports that he has been smoking Cigarettes.  He has been smoking about 0.75 packs per day. He has never used smokeless tobacco. He reports that he drinks alcohol. He reports that he does not use drugs.  REVIEW OF SYSTEMS:   unable  SUBJECTIVE:  unable  VITAL SIGNS:  BP 136/84   Pulse 112   Temp 98.7 F (37.1 C) (Oral)   Resp 25   Ht 6\' 1"  (1.854 m)   Wt 86.2 kg (190 lb)   SpO2 100%   BMI 25.07 kg/m   HEMODYNAMICS:    VENTILATOR SETTINGS: Vent Mode: (P) PRVC FiO2 (%):  [100 %] (P) 100 % Set Rate:  [14 bmp] (P) 14 bmp Vt Set:  [640 mL] (P) 640 mL PEEP:  [5 cmH20] (P) 5 cmH20 Plateau Pressure:  [18 cmH20] (P) 18 cmH20  INTAKE / OUTPUT: No intake/output data recorded.  PHYSICAL EXAMINATION: General:   Young fit male in NAD on vent Neuro:  Sedated HEENT:  [place]/AT, PERRL, no JVD Cardiovascular:  Tachy, regular, no MRG Lungs:  Coarse expiratory wheeze Abdomen:  Soft, non-tender, non-distended Musculoskeletal:  No acute deformity or ROM limitation Skin:  Grossly intact  LABS:  BMET  Recent Labs Lab 01/09/16 2246  NA 140  K 4.0  CL 106  CO2 26  BUN 12  CREATININE 1.13  GLUCOSE 143*    Electrolytes  Recent Labs Lab 01/09/16 2246  CALCIUM 8.5*    CBC  Recent Labs Lab 01/09/16 2246  WBC 9.9  HGB 18.0*  HCT 52.4*  PLT 289    Coag's No results for input(s): APTT, INR in the last 168 hours.  Sepsis Markers No results for input(s): LATICACIDVEN, PROCALCITON, O2SATVEN in the last 168 hours.  ABG  Recent Labs Lab 01/09/16 2340  PHART 7.167*  PCO2ART 80.9*  PO2ART  ABOVE REPORTABLE RANGE.    Liver Enzymes No results for input(s): AST, ALT, ALKPHOS, BILITOT, ALBUMIN in the last 168 hours.  Cardiac Enzymes No results for input(s): TROPONINI, PROBNP in the last 168 hours.  Glucose No results for input(s): GLUCAP in the last 168 hours.  Imaging Dg Chest 2 View  Result Date: 01/09/2016 CLINICAL DATA:  Acute onset of shortness of breath and wheezing. Initial encounter. EXAM: CHEST  2 VIEW COMPARISON:  Chest radiograph from 12/18/2015 FINDINGS: The lungs are well-aerated and clear. There is no evidence of focal opacification, pleural effusion or pneumothorax. The heart is normal in size; the mediastinal contour is within normal limits. No acute osseous abnormalities are seen. IMPRESSION: No acute cardiopulmonary process seen. Electronically Signed   By: Roanna RaiderJeffery  Chang M.D.   On: 01/09/2016 22:04   Dg Chest Portable 1 View  Result Date: 01/09/2016 CLINICAL DATA:  30 y/o  M; post intubation. EXAM: PORTABLE CHEST 1 VIEW COMPARISON:  01/09/2016 chest radiograph. FINDINGS: Endotracheal tube tip approximately 4.5 cm from carina. Enteric tube tip below the field of  view within the abdomen with proximal side hole at the gastroesophageal junction. Clear lungs. Normal cardiac silhouette. No effusion or pneumothorax. No acute osseous abnormality is evident. IMPRESSION: No acute pulmonary process identified. Endotracheal tube 4.5 cm from carina. Enteric tube tip below the field of view in the abdomen. Electronically Signed   By: Mitzi HansenLance  Furusawa-Stratton M.D.   On: 01/09/2016 23:42     STUDIES:    CULTURES:   ANTIBIOTICS:   SIGNIFICANT EVENTS: 11/22 intubated for asthma  LINES/TUBES: ETT 11/23 >  DISCUSSION: 30 year old male with asthma admitted with status asthmaticus requiring intubation.   ASSESSMENT / PLAN:  Acute hypercarbic respiratory failure secondary to status asthmaticus: with history of asthma since childhood. Poor medication compliance and is a heavy smoker for the past 6 years. Has been admitted and intubated in past. Initial ABG with severe respiratory acidosis. No focal  opacification on CXR.  Plan: Full vent support with PRVC. Rate adjusted to achieve I:E 1:4. 8cc/kg IBW Vt Scheduled duonebs and PRN albuterol Solumedrol 60mg  q 8 hours. Repeat ABG one hour. SBT in AM Needs to quit smoking ASAP Will need pulmonary follow up at discharge  Acute metabolic encephalopathy  Plan: Correct acid/base with vent Propofol infusion for vent synchrony RASS goal -1 to -2.   Best practice VTE ppx: SCDs and SQ heparin Diet: NPO   Admit: ICU  Plan discussed with mother at bedside in ED.   Joneen RoachPaul [facility], AGACNP-BC [facility] Pager [phone] or [phone]  01/10/2016 12:56 AM

## 2016-01-10 NOTE — Progress Notes (Signed)
eLink Physician-Brief Progress Note Patient Name: Henry Hardin L Tinkle DOB: 06/14/1985 MRN: [id number]   Date of Service  01/10/2016  HPI/Events of Note  RN calling about severe resp acidosis, ongoing breath stacking with wheezing.   eICU Interventions  Start bic gtt -> reduce RR to 10 from 18 -> check abg -> then decide on paralysis  Check rvp     Intervention Category Major Interventions: Other:  Eric Morganti 01/10/2016, 4:41 PM

## 2016-01-10 NOTE — Progress Notes (Addendum)
eLink Physician-Brief Progress Note Patient Name: Henry Hardin DOB: 09/09/1985 MRN: [id number]    Recent Labs Lab 01/09/16 2340 01/10/16 0130 01/10/16 0441 01/10/16 1320 01/10/16 1844  PHART 7.167* 7.281* 7.307* 7.155* 7.178*  PCO2ART 80.9* 60.0* 50.8* 78.7* 81.1*  PO2ART  ABOVE REPORTABLE RANGE. 72.2* 68.1* 89.9 124*  HCO3 28.2* 27.3 24.6 26.6 28.5*  O2SAT 99.3 91.2 90.4 94.6 97.6    RR now down at 10 and bic gtt at 100cc/h.  Acidosis some better but still severe Vent mechanics still with dysnchrony though well sedated Wheezing +  Plan increaed bic gtt to 125cc/h Give 2gm mag sulfate to help with wheeeze Might need to adjust vent settings    Intervention Category Major Interventions: Acid-Base disturbance - evaluation and management  Henry Hardin 01/10/2016, 7:15 PM

## 2016-01-10 NOTE — ED Notes (Signed)
Pt moved to RES B for acute respiratory failure. Jacubowitz MD at bedside to facilitate intubation. Respiratory at bedside to assist. Pt in notable distress upon arrival to room.  Pt intubated, OG placed, Foley placed. Chest Xray obtained for ETT placement.  18G  IV started in Left upper arm. See MAR for medication given. Mother at bedside and made aware of plan of care.

## 2016-01-10 NOTE — ED Notes (Signed)
[facility] notified of need for transport to [facility] 

## 2016-01-10 NOTE — ED Notes (Signed)
PTs Mother contact information  Mayra ReelYvonne Gambrell [phone] Newell CoralEileen Bencomo [phone]

## 2016-01-10 NOTE — Procedures (Signed)
Arterial Catheter Insertion Procedure Note Henry Hardin [id number] 10/04/1985  Procedure: Insertion of Arterial Catheter  Indications: Blood pressure monitoring and Frequent blood sampling  Procedure Details Consent: Unable to obtain consent because of altered level of consciousness. Pt. Intubated on ventilator. Time Out: Verified patient identification, verified procedure, site/side was marked, verified correct patient position, special equipment/implants available, medications/allergies/relevent history reviewed, required imaging and test results available.  Performed  Maximum sterile technique was used including cap, gloves, gown, hand hygiene, mask and sheet. Skin prep: Chlorhexidine; local anesthetic administered 20 gauge catheter was inserted into left radial artery using the Seldinger technique.  Evaluation Blood flow good; BP tracing good. Complications: No apparent complications   Assisted by Ledell NossBrandy Brown, RRT.   Carlynn SpryCobb, Adrien Shankar L 01/10/2016
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
## 2016-01-10 NOTE — Progress Notes (Signed)
eLink Physician-Brief Progress Note Patient Name: Henry Hardin DOB: 10/30/1985 MRN: [id number]   Date of Service  01/10/2016  HPI/Events of Note  PH now 7.20   eICU Interventions  Increase RR on vent from 10 to 12 Versed prn  Reassess - might need anotehr vent mode     Intervention Category Major Interventions: Acid-Base disturbance - evaluation and management  Jerilyn Gillaspie 01/10/2016, 8:34 PM

## 2016-01-11 LAB — POCT I-STAT 3, ART BLOOD GAS (G3+)
Acid-Base Excess: 4 mmol/L — ABNORMAL HIGH (ref 0.0–2.0)
Bicarbonate: 35.5 mmol/L — ABNORMAL HIGH (ref 20.0–28.0)
O2 Saturation: 93 %
PCO2 ART: 87.9 mmHg — AB (ref 32.0–48.0)
PH ART: 7.209 — AB (ref 7.350–7.450)
PO2 ART: 82 mmHg — AB (ref 83.0–108.0)
Patient temperature: 36.2
TCO2: 38 mmol/L (ref 0–100)

## 2016-01-11 LAB — PROCALCITONIN: PROCALCITONIN: 2.28 ng/mL

## 2016-01-11 LAB — BLOOD GAS, ARTERIAL
ACID-BASE EXCESS: 6.2 mmol/L — AB (ref 0.0–2.0)
BICARBONATE: 32 mmol/L — AB (ref 20.0–28.0)
DRAWN BY: 244851
FIO2: 40
LHR: 24 {breaths}/min
MECHVT: 640 mL
O2 SAT: 93 %
PATIENT TEMPERATURE: 98.6
PEEP/CPAP: 5 cmH2O
PH ART: 7.329 — AB (ref 7.350–7.450)
PO2 ART: 68.1 mmHg — AB (ref 83.0–108.0)
pCO2 arterial: 62.5 mmHg — ABNORMAL HIGH (ref 32.0–48.0)

## 2016-01-11 LAB — BASIC METABOLIC PANEL
ANION GAP: 11 (ref 5–15)
BUN: 23 mg/dL — ABNORMAL HIGH (ref 6–20)
CHLORIDE: 99 mmol/L — AB (ref 101–111)
CO2: 28 mmol/L (ref 22–32)
CREATININE: 1.6 mg/dL — AB (ref 0.61–1.24)
Calcium: 8.1 mg/dL — ABNORMAL LOW (ref 8.9–10.3)
GFR calc non Af Amer: 57 mL/min — ABNORMAL LOW (ref >60)
Glucose, Bld: 299 mg/dL — ABNORMAL HIGH (ref 65–99)
POTASSIUM: 4.5 mmol/L (ref 3.5–5.1)
SODIUM: 138 mmol/L (ref 135–145)

## 2016-01-11 LAB — RESPIRATORY PANEL BY PCR
Adenovirus: NOT DETECTED
BORDETELLA PERTUSSIS-RVPCR: NOT DETECTED
CHLAMYDOPHILA PNEUMONIAE-RVPPCR: NOT DETECTED
CORONAVIRUS 229E-RVPPCR: NOT DETECTED
CORONAVIRUS HKU1-RVPPCR: NOT DETECTED
Coronavirus NL63: NOT DETECTED
Coronavirus OC43: NOT DETECTED
INFLUENZA B-RVPPCR: NOT DETECTED
Influenza A: NOT DETECTED
METAPNEUMOVIRUS-RVPPCR: NOT DETECTED
MYCOPLASMA PNEUMONIAE-RVPPCR: NOT DETECTED
PARAINFLUENZA VIRUS 2-RVPPCR: NOT DETECTED
Parainfluenza Virus 1: NOT DETECTED
Parainfluenza Virus 3: NOT DETECTED
Parainfluenza Virus 4: NOT DETECTED
RESPIRATORY SYNCYTIAL VIRUS-RVPPCR: NOT DETECTED
Rhinovirus / Enterovirus: NOT DETECTED

## 2016-01-11 LAB — PHOSPHORUS
PHOSPHORUS: 5.2 mg/dL — AB (ref 2.5–4.6)
Phosphorus: 1.8 mg/dL — ABNORMAL LOW (ref 2.5–4.6)

## 2016-01-11 LAB — CBC
HEMATOCRIT: 41 % (ref 39.0–52.0)
HEMOGLOBIN: 13.2 g/dL (ref 13.0–17.0)
MCH: 29.9 pg (ref 26.0–34.0)
MCHC: 32.2 g/dL (ref 30.0–36.0)
MCV: 92.8 fL (ref 78.0–100.0)
Platelets: 213 10*3/uL (ref 150–400)
RBC: 4.42 MIL/uL (ref 4.22–5.81)
RDW: 13.2 % (ref 11.5–15.5)
WBC: 13.9 10*3/uL — AB (ref 4.0–10.5)

## 2016-01-11 LAB — GLUCOSE, CAPILLARY
GLUCOSE-CAPILLARY: 116 mg/dL — AB (ref 65–99)
GLUCOSE-CAPILLARY: 158 mg/dL — AB (ref 65–99)
GLUCOSE-CAPILLARY: 177 mg/dL — AB (ref 65–99)
GLUCOSE-CAPILLARY: 190 mg/dL — AB (ref 65–99)
GLUCOSE-CAPILLARY: 200 mg/dL — AB (ref 65–99)
GLUCOSE-CAPILLARY: 208 mg/dL — AB (ref 65–99)
GLUCOSE-CAPILLARY: 296 mg/dL — AB (ref 65–99)
GLUCOSE-CAPILLARY: 313 mg/dL — AB (ref 65–99)
Glucose-Capillary: 333 mg/dL — ABNORMAL HIGH (ref 65–99)
Glucose-Capillary: 326 mg/dL — ABNORMAL HIGH (ref 65–99)
Glucose-Capillary: 289 mg/dL — ABNORMAL HIGH (ref 65–99)
Glucose-Capillary: 191 mg/dL — ABNORMAL HIGH (ref 65–99)
Glucose-Capillary: 140 mg/dL — ABNORMAL HIGH (ref 65–99)
Glucose-Capillary: 164 mg/dL — ABNORMAL HIGH (ref 65–99)
Glucose-Capillary: 152 mg/dL — ABNORMAL HIGH (ref 65–99)
Glucose-Capillary: 156 mg/dL — ABNORMAL HIGH (ref 65–99)
Glucose-Capillary: 226 mg/dL — ABNORMAL HIGH (ref 65–99)

## 2016-01-11 LAB — MAGNESIUM
MAGNESIUM: 2.9 mg/dL — AB (ref 1.7–2.4)
Magnesium: 2.5 mg/dL — ABNORMAL HIGH (ref 1.7–2.4)

## 2016-01-11 LAB — HIV ANTIBODY (ROUTINE TESTING W REFLEX): HIV Screen 4th Generation wRfx: NONREACTIVE

## 2016-01-11 LAB — TROPONIN I

## 2016-01-11 LAB — LACTIC ACID, PLASMA: LACTIC ACID, VENOUS: 1.3 mmol/L (ref 0.5–1.9)

## 2016-01-11 MED ORDER — FENTANYL CITRATE (PF) 100 MCG/2ML IJ SOLN: 100.0000 ug | Freq: Once | INTRAMUSCULAR | Status: DC | PRN

## 2016-01-11 MED ORDER — ALBUTEROL (5 MG/ML) CONTINUOUS INHALATION SOLN
20.0000 mg/h | INHALATION_SOLUTION | RESPIRATORY_TRACT | Status: DC
  Administered 2016-01-11: 20 mg/h via RESPIRATORY_TRACT
  Filled 2016-01-11: qty 20

## 2016-01-11 MED ORDER — VECURONIUM BROMIDE 10 MG IV SOLR
0.8000 ug/kg/min | INTRAVENOUS | Status: DC
  Administered 2016-01-11: 0.8 ug/kg/min via INTRAVENOUS
  Administered 2016-01-12: 1.2 ug/kg/min via INTRAVENOUS
  Administered 2016-01-13 – 2016-01-15 (×3): 1.7 ug/kg/min via INTRAVENOUS
  Filled 2016-01-11 (×8): qty 100

## 2016-01-11 MED ORDER — ARTIFICIAL TEARS OP OINT
1.0000 "application " | TOPICAL_OINTMENT | Freq: Three times a day (TID) | OPHTHALMIC | Status: DC
  Administered 2016-01-11 – 2016-01-18 (×15): 1 via OPHTHALMIC
  Filled 2016-01-11: qty 3.5

## 2016-01-11 MED ORDER — PROPOFOL 1000 MG/100ML IV EMUL: 25.0000 ug/kg/min | INTRAVENOUS | Status: DC

## 2016-01-11 MED ORDER — VECURONIUM BROMIDE 10 MG IV SOLR: 8.0000 mg | Freq: Once | INTRAVENOUS | Status: AC

## 2016-01-11 MED ORDER — SODIUM CHLORIDE 0.9 % IV SOLN
INTRAVENOUS | Status: DC
  Filled 2016-01-11: qty 2.5

## 2016-01-11 MED ORDER — FENTANYL 2500MCG IN NS 250ML (10MCG/ML) PREMIX INFUSION
100.0000 ug/h | INTRAVENOUS | Status: DC
Start: 2016-01-11 — End: 2016-01-11

## 2016-01-11 MED ORDER — INSULIN REGULAR BOLUS VIA INFUSION
0.0000 [IU] | Freq: Three times a day (TID) | INTRAVENOUS | Status: DC
  Filled 2016-01-11: qty 10

## 2016-01-11 MED ORDER — DEXTROSE-NACL 5-0.45 % IV SOLN: INTRAVENOUS | Status: DC

## 2016-01-11 MED ORDER — DEXTROSE 50 % IV SOLN: 25.0000 mL | INTRAVENOUS | Status: DC | PRN

## 2016-01-11 MED ORDER — FENTANYL BOLUS VIA INFUSION: 50.0000 ug | INTRAVENOUS | Status: DC | PRN

## 2016-01-11 MED ORDER — SODIUM CHLORIDE 0.9 % IV SOLN
INTRAVENOUS | Status: DC
  Administered 2016-01-11: 2.5 [IU]/h via INTRAVENOUS
  Filled 2016-01-11: qty 2.5

## 2016-01-11 MED ORDER — MONTELUKAST SODIUM 10 MG PO TABS
10.0000 mg | ORAL_TABLET | Freq: Every day | ORAL | Status: DC
  Administered 2016-01-11 – 2016-01-14 (×4): 10 mg via ORAL
  Filled 2016-01-11 (×4): qty 1

## 2016-01-11 MED ORDER — FENTANYL CITRATE (PF) 100 MCG/2ML IJ SOLN: 100.0000 ug | Freq: Once | INTRAMUSCULAR | Status: DC

## 2016-01-11 MED ORDER — SODIUM CHLORIDE 0.9 % IV SOLN
INTRAVENOUS | Status: DC
  Administered 2016-01-11: 09:00:00 via INTRAVENOUS

## 2016-01-11 MED ORDER — VECURONIUM BROMIDE 10 MG IV SOLR
INTRAVENOUS | Status: AC
  Administered 2016-01-11: 8 mg
  Filled 2016-01-11: qty 10

## 2016-01-11 MED ORDER — VITAL AF 1.2 CAL PO LIQD
1000.0000 mL | ORAL | Status: DC
  Administered 2016-01-11 – 2016-01-12 (×2): 1000 mL

## 2016-01-11 NOTE — Progress Notes (Signed)
Inpatient Diabetes Program Recommendations  AACE/ADA: New Consensus Statement on Inpatient Glycemic Control (2015)  Target Ranges:  Prepandial:   less than 140 mg/dL      Peak postprandial:   less than 180 mg/dL (1-2 hours)      Critically ill patients:  140 - 180 mg/dL   Lab Results  Component Value Date   GLUCAP 313 (H) 01/11/2016    Review of Glycemic ControlResults for Enzo BiLEWIS, Kirsten L (MRN [id number]) as of 01/11/2016 09:46  Ref. Range 01/10/2016 19:19 01/10/2016 23:10 01/11/2016 03:35 01/11/2016 08:10 01/11/2016 09:23  Glucose-Capillary Latest Ref Range: 65 - 99 mg/dL 409214 (H) 811257 (H) 914190 (H) 333 (H) 313 (H)    Diabetes history: None  Current orders for Inpatient glycemic control: IV insulin/Glucostabilizer  Inpatient Diabetes Program Recommendations:   Agree with IV insulin.  May consider phase 2 of ICU order set so that patient may transition per criteria?  Will discuss with RN.   Thanks, Beryl MeagerJenny Bless Lisenby, RN, BC-ADM Inpatient Diabetes Coordinator Pager [phone] (8a-5p)

## 2016-01-11 NOTE — Progress Notes (Signed)
eLink Physician-Brief Progress Note Patient Name: Henry Hardin DOB: 03/01/1985 MRN: [id number]   Date of Service  01/11/2016  HPI/Events of Note  Persistent HCRF with pH 7.2/91/87/34.  eICU Interventions  Plan: Increase in TV to 700cc Increase RR to 18 Recheck ABG at 5AM     Intervention Category Major Interventions: Acid-Base disturbance - evaluation and management  DETERDING,ELIZABETH 01/11/2016, 1:41 AM

## 2016-01-11 NOTE — Progress Notes (Signed)
Starting to stack breaths again. MD notified. Order for continuous NMB received.

## 2016-01-11 NOTE — Progress Notes (Signed)
PULMONARY / CRITICAL CARE MEDICINE   Name: Henry Hardin MRN: [id number] DOB: 08/15/1985    ADMISSION DATE:  01/09/2016 CONSULTATION DATE:  01/10/2016  REFERRING MD:  Dr. Ethelda ChickJacubowitz  CHIEF COMPLAINT:  SOB  HISTORY OF PRESENT ILLNESS:   30 year old male with PMH asthma since childhood. He has never been consistent with taking his controller medications and has been unable to get albuterol due to insurance issues per mother. He has been smoking about a pack per day for the past 6 years during which time his symptoms have gotten worse. He has been seen in the ED 8 times in the past 6 months with exacerbations of asthma. He has been intubated once in the past. 11/22 he presented to the ED with CC respiratory distress for about 3 hours. He was treated with mag, albuterol, and atrovent by EMS. He was additionally treated with solumedrol, and additional bronchodilators in ED without improvement. His mental status began to worsen and he required intubation. PCCM asked to admit.   SUBJECTIVE:  Increased air trapping on vent  VITAL SIGNS: BP (!) 78/42   Pulse 73   Temp 97.6 F (36.4 C) (Oral)   Resp 13   Ht 6\' 1"  (1.854 m)   Wt 178 lb 5.6 oz (80.9 kg)   SpO2 98%   BMI 23.53 kg/m   HEMODYNAMICS:    VENTILATOR SETTINGS: Vent Mode: PRVC FiO2 (%):  [35 %-50 %] 40 % Set Rate:  [10 bmp-18 bmp] 18 bmp Vt Set:  [640 mL-700 mL] 700 mL PEEP:  [5 cmH20] 5 cmH20 Plateau Pressure:  [17 cmH20-24 cmH20] 24 cmH20  INTAKE / OUTPUT: I/O last 3 completed shifts: In: 10512.7 [I.V.:8477.3; Other:1100; NG/GT:785.3; IV Piggyback:150] Out: 3775 [Urine:3600; Emesis/NG output:175]  PHYSICAL EXAMINATION: General:  Young fit male in NAD on vent Neuro:  Sedated, RASS -3 HEENT:  [place]/AT, PERRL, no JVD Cardiovascular:  Tachy, regular, no MRG Lungs:  resps even non labored on vent, Coarse expiratory wheeze upper Abdomen:  Soft, non-tender, non-distended Musculoskeletal:  No acute deformity or ROM  limitation Skin:  Grossly intact  LABS:  BMET  Recent Labs Lab 01/09/16 2246 01/11/16 0500  NA 140 138  K 4.0 4.5  CL 106 99*  CO2 26 28  BUN 12 23*  CREATININE 1.13 1.60*  GLUCOSE 143* 299*    Electrolytes  Recent Labs Lab 01/09/16 2246 01/10/16 1136 01/10/16 1716 01/11/16 0500  CALCIUM 8.5*  --   --  8.1*  MG  --  2.1 2.0 2.9*  PHOS  --  3.4 3.1 5.2*    CBC  Recent Labs Lab 01/09/16 2246 01/11/16 0500  WBC 9.9 13.9*  HGB 18.0* 13.2  HCT 52.4* 41.0  PLT 289 213    Coag's No results for input(s): APTT, INR in the last 168 hours.  Sepsis Markers  Recent Labs Lab 01/10/16 1430 01/11/16 0000 01/11/16 0500  LATICACIDVEN  --  1.3  --   PROCALCITON 2.89  --  2.28    ABG  Recent Labs Lab 01/10/16 1844 01/10/16 2025 01/11/16 0135  PHART 7.178* 7.201* 7.209*  PCO2ART 81.1* 75.4* 87.9*  PO2ART 124* 174* 82.0*    Liver Enzymes No results for input(s): AST, ALT, ALKPHOS, BILITOT, ALBUMIN in the last 168 hours.  Cardiac Enzymes  Recent Labs Lab 01/10/16 1430 01/10/16 1957 01/11/16 0145  TROPONINI <0.03 <0.03 <0.03    Glucose  Recent Labs Lab 01/10/16 1622 01/10/16 1919 01/10/16 2310 01/11/16 0335 01/11/16 0810  GLUCAP  243* 214* 257* 190* 333*    Imaging No results found.   STUDIES:    CULTURES:   ANTIBIOTICS:   SIGNIFICANT EVENTS: 11/22 intubated for asthma 11/24 airtrapping  LINES/TUBES: ETT 11/23 >  DISCUSSION: 30 year old male smoker with asthma since childhood, poorly compliant r/t cost. Frequent visits/admissions.  Admitted 11/23 with status asthmaticus requiring intubation. Permissive hypercarbia and one dose nmb 11/24  ASSESSMENT / PLAN:  Acute hypercarbic respiratory failure secondary to status asthmaticus:  No focal opacification on CXR.  Plan: Full vent support with PRVC. Continue to monitor for autopeep  Increase RR 24 and monitor  Heavy sedation  Try one dose of NMB 11/24 and may need drip  to allow ventilation Permissive hypercapnia with nacho3 drip to keep PH >7.2 Scheduled duonebs and PRN albuterol Solumedrol q6h Repeat ABG  SBT when able Needs to quit smoking ASAP Will need pulmonary follow up at discharge  Acute metabolic encephalopathy Plan: Correct acid/base with vent Propofol infusion for vent synchrony RASS goal -1 to -2.   Hypotension - likely r/t high dose propofol required for vent synchrony  Plan:  Minimize air trapping Wean propofol/fentanyl as able  Wean neo gtt as able   Best practice VTE ppx: SCDs and SQ heparin Diet: NPO    Familyupdated at length at bedside 11/24 am.    CCT= 45 minutes  Brett CanalesSteve Minor ACNP [facility] PollackLe [facility] PCCM Pager [phone] till 3 pm If no answer page [phone] 01/11/2016, 8:55 AM  Attending note: I have seen and examined the patient with nurse practitioner/resident and agree with the note. History, labs and imaging reviewed.  30 Y/O with status asthmaticus.  Intubated. Issues with hypercarbia, acidosis, air trapping.  Blood pressure 137/67, pulse 79, temperature 97.8 F (36.6 C), temperature source Axillary, resp. rate (!) 24, height 6\' 1"  (1.854 m), weight 178 lb 5.6 oz (80.9 kg), SpO2 99 %. Sedated, unresponsive CVS-RRR RS- B/L Exp wheeze Abd- Soft, + BS Ext- No edema  Labs and imaging reviewed  - Reduce TV to 8 cc/kg - Continue permissive hypercarbia - Neo for BP support. - Deep sedation. One dose paralytics. He may need a paralytic drip but we would like to avoid if possible - Continue steroids, nebs - Start singulair  The patient is critically ill with multiple organ systems failure and requires high complexity decision making for assessment and support, frequent evaluation and titration of therapies, application of advanced monitoring technologies and extensive interpretation of multiple databases. Critical care time - 35 mins. This represents my time independent of the NPs time taking care of the  pt.  Chilton GreathousePraveen Maximiliano Cromartie MD [facility] Pager [phone] If no answer or after 3pm call: [phone] 01/11/2016, 10:57 AM

## 2016-01-11 NOTE — Progress Notes (Signed)
Pt's mother, Newell Coralileen Moye, updated at bedside by Dr Alvia GroveMannem and RN.

## 2016-01-11 NOTE — Progress Notes (Signed)
Initial Nutrition Assessment  DOCUMENTATION CODES:  Not applicable  INTERVENTION:  Change TF formula to Vital AF 1.2and increase to goal rate of 50 ml/h (1200 ml per day) and continue Prostat 30 ml BID to provide 1640 kcals (+684 kcals from sedative), 120 gm protein, 973 ml free water daily.  NUTRITION DIAGNOSIS:  Inadequate oral intake related to inability to eat as evidenced by NPO status.  GOAL:  Patient will meet greater than or equal to 90% of their needs  MONITOR:  TF tolerance, Diet advancement, Vent status, Labs, I & O's  REASON FOR ASSESSMENT:  Consult Enteral/tube feeding initiation and management  ASSESSMENT:  30 y/o male PMHx Asthma w/ poor Compliance and tobacco abuse. Presented to ED w/ respiratory distress. Eventually required intubation. RD consulted for TF.   Pt intubated, sedated. No historians present.   VHP infusing @ 45. Abdomen soft.   On paralytic.   NFPE: Well nourished.   Patient is currently intubated on ventilator support MV: 14.6 L/min Temp (24hrs), Avg:98.3 F (36.8 C), Min:96.4 F (35.8 C), Max:100.2 F (37.9 C)  Propofol: 25.9 ml/hr = 684 kcals/day  Labs: cbg: 290-320, mag/phos elevated.  Medications: IV abx, pepcid, Vital HP, Prostat, Prednisolone. Propofol.    Recent Labs Lab 01/09/16 2246 01/10/16 1136 01/10/16 1716 01/11/16 0500  NA 140  --   --  138  K 4.0  --   --  4.5  CL 106  --   --  99*  CO2 26  --   --  28  BUN 12  --   --  23*  CREATININE 1.13  --   --  1.60*  CALCIUM 8.5*  --   --  8.1*  MG  --  2.1 2.0 2.9*  PHOS  --  3.4 3.1 5.2*  GLUCOSE 143*  --   --  299*   Diet Order:  Diet NPO time specified  Skin:  Reviewed, no issues  Last BM:  11/22  Height:  Ht Readings from Last 1 Encounters:  01/09/16 6\' 1"  (1.854 m)   Weight:  Wt Readings from Last 1 Encounters:  01/11/16 178 lb 5.6 oz (80.9 kg)   Wt Readings from Last 10 Encounters:  01/11/16 178 lb 5.6 oz (80.9 kg)  12/18/15 190 lb (86.2 kg)   04/07/15 185 lb (83.9 kg)  02/02/15 190 lb (86.2 kg)  11/26/14 190 lb (86.2 kg)  11/07/14 190 lb (86.2 kg)  10/29/14 185 lb (83.9 kg)  09/23/14 175 lb (79.4 kg)  03/06/14 195 lb (88.5 kg)  02/17/14 190 lb (86.2 kg)  Dry Weight: 176 lbs  Ideal Body Weight:  83.64 kg  BMI:  Body mass index is 23.53 kg/m.  Estimated Nutritional Needs:  Kcal:  2320 kcals Protein:  112-128 g (1.4-1.6 g/kg bw) Fluid:  Per MD  EDUCATION NEEDS:  No education needs identified at this time  Christophe LouisNathan Franks RD, LDN, CNSC Clinical Nutrition Pager: [phone] 01/11/2016 4:51 PM

## 2016-01-12 ENCOUNTER — Inpatient Hospital Stay (HOSPITAL_COMMUNITY): Payer: Self-pay

## 2016-01-12 LAB — BASIC METABOLIC PANEL
ANION GAP: 13 (ref 5–15)
BUN: 19 mg/dL (ref 6–20)
CALCIUM: 8.1 mg/dL — AB (ref 8.9–10.3)
CHLORIDE: 96 mmol/L — AB (ref 101–111)
CO2: 33 mmol/L — AB (ref 22–32)
CREATININE: 1.08 mg/dL (ref 0.61–1.24)
GFR calc non Af Amer: >60 mL/min (ref >60)
Glucose, Bld: 176 mg/dL — ABNORMAL HIGH (ref 65–99)
Potassium: 3.7 mmol/L (ref 3.5–5.1)
SODIUM: 142 mmol/L (ref 135–145)

## 2016-01-12 LAB — GLUCOSE, CAPILLARY
GLUCOSE-CAPILLARY: 175 mg/dL — AB (ref 65–99)
GLUCOSE-CAPILLARY: 164 mg/dL — AB (ref 65–99)
GLUCOSE-CAPILLARY: 147 mg/dL — AB (ref 65–99)
GLUCOSE-CAPILLARY: 136 mg/dL — AB (ref 65–99)
GLUCOSE-CAPILLARY: 165 mg/dL — AB (ref 65–99)
GLUCOSE-CAPILLARY: 157 mg/dL — AB (ref 65–99)
GLUCOSE-CAPILLARY: 159 mg/dL — AB (ref 65–99)
GLUCOSE-CAPILLARY: 150 mg/dL — AB (ref 65–99)
GLUCOSE-CAPILLARY: 164 mg/dL — AB (ref 65–99)
GLUCOSE-CAPILLARY: 130 mg/dL — AB (ref 65–99)
Glucose-Capillary: 134 mg/dL — ABNORMAL HIGH (ref 65–99)
Glucose-Capillary: 190 mg/dL — ABNORMAL HIGH (ref 65–99)
Glucose-Capillary: 156 mg/dL — ABNORMAL HIGH (ref 65–99)
Glucose-Capillary: 186 mg/dL — ABNORMAL HIGH (ref 65–99)
Glucose-Capillary: 164 mg/dL — ABNORMAL HIGH (ref 65–99)
Glucose-Capillary: 135 mg/dL — ABNORMAL HIGH (ref 65–99)
Glucose-Capillary: 137 mg/dL — ABNORMAL HIGH (ref 65–99)
Glucose-Capillary: 128 mg/dL — ABNORMAL HIGH (ref 65–99)
Glucose-Capillary: 136 mg/dL — ABNORMAL HIGH (ref 65–99)
Glucose-Capillary: 168 mg/dL — ABNORMAL HIGH (ref 65–99)
Glucose-Capillary: 162 mg/dL — ABNORMAL HIGH (ref 65–99)

## 2016-01-12 LAB — CBC
HCT: 39.6 % (ref 39.0–52.0)
HEMOGLOBIN: 12.7 g/dL — AB (ref 13.0–17.0)
MCH: 29.4 pg (ref 26.0–34.0)
MCHC: 32.1 g/dL (ref 30.0–36.0)
MCV: 91.7 fL (ref 78.0–100.0)
PLATELETS: 211 10*3/uL (ref 150–400)
RBC: 4.32 MIL/uL (ref 4.22–5.81)
RDW: 13.3 % (ref 11.5–15.5)
WBC: 15.1 10*3/uL — AB (ref 4.0–10.5)

## 2016-01-12 LAB — POCT I-STAT 3, ART BLOOD GAS (G3+)
Acid-Base Excess: 13 mmol/L — ABNORMAL HIGH (ref 0.0–2.0)
Bicarbonate: 43 mmol/L — ABNORMAL HIGH (ref 20.0–28.0)
O2 SAT: 96 %
PCO2 ART: 89.1 mmHg — AB (ref 32.0–48.0)
PO2 ART: 94 mmHg (ref 83.0–108.0)
Patient temperature: 98.5
TCO2: 46 mmol/L (ref 0–100)
pH, Arterial: 7.291 — ABNORMAL LOW (ref 7.350–7.450)

## 2016-01-12 LAB — PROCALCITONIN: Procalcitonin: 1.2 ng/mL

## 2016-01-12 LAB — PHOSPHORUS: PHOSPHORUS: 2.2 mg/dL — AB (ref 2.5–4.6)

## 2016-01-12 LAB — MAGNESIUM: Magnesium: 2.4 mg/dL (ref 1.7–2.4)

## 2016-01-12 LAB — TRIGLYCERIDES: Triglycerides: 111 mg/dL (ref <150)

## 2016-01-12 MED ORDER — IPRATROPIUM BROMIDE 0.02 % IN SOLN
0.5000 mg | RESPIRATORY_TRACT | Status: DC
  Administered 2016-01-12 – 2016-01-22 (×58): 0.5 mg via RESPIRATORY_TRACT
  Filled 2016-01-12 (×57): qty 2.5

## 2016-01-12 MED ORDER — ALBUTEROL SULFATE (2.5 MG/3ML) 0.083% IN NEBU
5.0000 mg | INHALATION_SOLUTION | RESPIRATORY_TRACT | Status: DC
  Administered 2016-01-12 – 2016-01-13 (×7): 5 mg via RESPIRATORY_TRACT
  Administered 2016-01-14: 2.5 mg via RESPIRATORY_TRACT
  Administered 2016-01-14 (×3): 5 mg via RESPIRATORY_TRACT
  Administered 2016-01-14: 2.5 mg via RESPIRATORY_TRACT
  Administered 2016-01-14 – 2016-01-15 (×4): 5 mg via RESPIRATORY_TRACT
  Administered 2016-01-15: 2.5 mg via RESPIRATORY_TRACT
  Administered 2016-01-15: 5 mg via RESPIRATORY_TRACT
  Administered 2016-01-15 (×2): 2.5 mg via RESPIRATORY_TRACT
  Administered 2016-01-16 (×4): 5 mg via RESPIRATORY_TRACT
  Administered 2016-01-16: 2.5 mg via RESPIRATORY_TRACT
  Administered 2016-01-16 – 2016-01-18 (×13): 5 mg via RESPIRATORY_TRACT
  Administered 2016-01-19: 2.5 mg via RESPIRATORY_TRACT
  Administered 2016-01-19 (×3): 5 mg via RESPIRATORY_TRACT
  Administered 2016-01-19: 2.5 mg via RESPIRATORY_TRACT
  Administered 2016-01-19: 5 mg via RESPIRATORY_TRACT
  Administered 2016-01-20: 2.5 mg via RESPIRATORY_TRACT
  Administered 2016-01-20: 5 mg via RESPIRATORY_TRACT
  Administered 2016-01-20 (×3): 2.5 mg via RESPIRATORY_TRACT
  Administered 2016-01-20: 5 mg via RESPIRATORY_TRACT
  Administered 2016-01-21: 2.5 mg via RESPIRATORY_TRACT
  Administered 2016-01-21 – 2016-01-22 (×7): 5 mg via RESPIRATORY_TRACT
  Filled 2016-01-12 (×57): qty 6

## 2016-01-12 NOTE — Progress Notes (Signed)
eLink Physician-Brief Progress Note Patient Name: Henry Hardin DOB: 02/12/1986 MRN: [id number]   Date of Service  01/12/2016  HPI/Events of Note  High peak pr  Double stacking On vec gtt - TOF 1/4  eICU Interventions  Increase Tv 500 Lower RR 14 Lower trigger -5 Abov eliminated double stacking, lowered PEEP to 5 Rpt ABG in 1h Goal TOF- vent synchrony or 1-3/4     Intervention Category Intermediate Interventions: Bronchospasm - evaluation and treatment;Respiratory distress - evaluation and management  [name] V. 01/12/2016, 10:40 PM

## 2016-01-12 NOTE — Progress Notes (Signed)
CCM notified regarding pts rising BP. A-line with good waveform reading 170-180s. Cuff pressure reading 130-140s. Dr. Darrick Pennaeterding ordered to d/c a-line. No additional orders given, will continue to monitor for changes.

## 2016-01-12 NOTE — Progress Notes (Signed)
PULMONARY / CRITICAL CARE MEDICINE   Name: Henry Hardin MRN: [id number] DOB: 05/14/1985    ADMISSION DATE:  01/09/2016 CONSULTATION DATE:  01/10/2016  REFERRING MD:  Dr. Ethelda ChickJacubowitz  CHIEF COMPLAINT:  SOB  HISTORY OF PRESENT ILLNESS:   30 year old male with PMH asthma since childhood. He has never been consistent with taking his controller medications and has been unable to get albuterol due to insurance issues per mother. He has been smoking about a pack per day for the past 6 years during which time his symptoms have gotten worse. He has been seen in the ED 8 times in the past 6 months with exacerbations of asthma. He has been intubated once in the past. 11/22 he presented to the ED with CC respiratory distress for about 3 hours. He was treated with mag, albuterol, and atrovent by EMS. He was additionally treated with solumedrol, and additional bronchodilators in ED without improvement. His mental status began to worsen and he required intubation. PCCM asked to admit.   SUBJECTIVE:  Air trapping but some better today. On paralytics.   VITAL SIGNS: BP 135/65   Pulse 70   Temp 97.7 F (36.5 C) (Axillary)   Resp (!) 24   Ht 6\' 1"  (1.854 m)   Wt 81.1 kg (178 lb 12.7 oz)   SpO2 100%   BMI 23.59 kg/m   HEMODYNAMICS:    VENTILATOR SETTINGS: Vent Mode: PRVC FiO2 (%):  [40 %] 40 % Set Rate:  [24 bmp] 24 bmp Vt Set:  [640 mL] 640 mL PEEP:  [5 cmH20] 5 cmH20 Plateau Pressure:  [19 cmH20-25 cmH20] 25 cmH20  INTAKE / OUTPUT: I/O last 3 completed shifts: In: 12988.5 [I.V.:11035; NG/GT:1803.5; IV Piggyback:150] Out: 5730 [Urine:5730]  PHYSICAL EXAMINATION: General:  Young fit male in NAD on vent Neuro:  Sedated, paralyzed, RASS -4 HEENT:  [place]/AT, PERRL, no JVD Cardiovascular:  Tachy, regular, no MRG Lungs:  resps even non labored on vent, ongoing diffuse wheeze  Abdomen:  Soft, non-tender, non-distended Musculoskeletal:  No acute deformity or ROM limitation Skin:  Grossly  intact  LABS:  BMET  Recent Labs Lab 01/09/16 2246 01/11/16 0500 01/12/16 0558  NA 140 138 142  K 4.0 4.5 3.7  CL 106 99* 96*  CO2 26 28 33*  BUN 12 23* 19  CREATININE 1.13 1.60* 1.08  GLUCOSE 143* 299* 176*    Electrolytes  Recent Labs Lab 01/09/16 2246  01/11/16 0500 01/11/16 1700 01/12/16 0558  CALCIUM 8.5*  --  8.1*  --  8.1*  MG  --   < > 2.9* 2.5* 2.4  PHOS  --   < > 5.2* 1.8* 2.2*  < > = values in this interval not displayed.  CBC  Recent Labs Lab 01/09/16 2246 01/11/16 0500 01/12/16 0558  WBC 9.9 13.9* 15.1*  HGB 18.0* 13.2 12.7*  HCT 52.4* 41.0 39.6  PLT 289 213 211    Coag's No results for input(s): APTT, INR in the last 168 hours.  Sepsis Markers  Recent Labs Lab 01/10/16 1430 01/11/16 0000 01/11/16 0500 01/12/16 0558  LATICACIDVEN  --  1.3  --   --   PROCALCITON 2.89  --  2.28 1.20    ABG  Recent Labs Lab 01/10/16 2025 01/11/16 0135 01/11/16 0950  PHART 7.201* 7.209* 7.329*  PCO2ART 75.4* 87.9* 62.5*  PO2ART 174* 82.0* 68.1*    Liver Enzymes No results for input(s): AST, ALT, ALKPHOS, BILITOT, ALBUMIN in the last 168 hours.  Cardiac  Enzymes  Recent Labs Lab 01/10/16 1430 01/10/16 1957 01/11/16 0145  TROPONINI <0.03 <0.03 <0.03    Glucose  Recent Labs Lab 01/12/16 0634 01/12/16 0735 01/12/16 0846 01/12/16 1006 01/12/16 1111 01/12/16 1210  GLUCAP 190* 165* 157* 159* 156* 186*    Imaging Dg Chest Port 1 View  Result Date: 01/12/2016 CLINICAL DATA:  Respiratory failure EXAM: PORTABLE CHEST 1 VIEW COMPARISON:  01/09/2016 chest radiograph. FINDINGS: Endotracheal tube tip is 4.2 cm above the carina. Enteric tube enters stomach with the tip not seen on this image, with the side port near the esophagogastric junction. Stable cardiomediastinal silhouette with normal heart size. No pneumothorax. No pleural effusion. Lungs appear clear, with no acute consolidative airspace disease and no pulmonary edema.  IMPRESSION: 1. Support structures as described. 2. No active cardiopulmonary disease. Electronically Signed   By: Delbert PhenixJason A Poff M.D.   On: 01/12/2016 08:08     STUDIES:    CULTURES: MRSA (-) Trache asp 11/24 > (-) Blood 11/24 (-)  ANTIBIOTICS: Azithro 11/24  SIGNIFICANT EVENTS: 11/22 intubated for asthma 11/24 airtrapping  LINES/TUBES: ETT 11/23 >  DISCUSSION: 30 year old male smoker with asthma since childhood, poorly compliant r/t cost. Frequent visits/admissions.  Admitted 11/23 with status asthmaticus requiring intubation. Permissive hypercarbia and one dose nmb 11/24  ASSESSMENT / PLAN:  Acute hypercarbic respiratory failure secondary to status asthmaticus:  No focal opacification on CXR. Bronchitis.   Plan: Full vent support with PRVC. Heavy sedation  Continue NMB gtt for now - will  d/c 11/26. Will stop TF.  Scheduled duonebs and PRN albuterol. Inc duoneb to 5 mg q4 Continue Solumedrol 60mg  IV q6h Repeat ABG in am  Needs to quit smoking ASAP Will need pulmonary follow up at discharge  Acute metabolic encephalopathy Plan: Correct acid/base with vent Propofol/fentanyl gtts  RASS goal -1 to -2.  Cont paralytics  Hypotension -Resolved.  likely r/t high dose propofol required for vent synchrony  Plan:  Minimize air trapping Wean propofol/fentanyl as able   Best practice VTE ppx: SCDs and SQ heparin Diet: NPO    Mom updated at length at bedside 11/25 am.     Dirk DressKaty Whiteheart, NP 01/12/2016  12:12 PM Pager: [phone]      ATTENDING NOTE / ATTESTATION NOTE :   I have discussed the case with the resident/APP  Dirk DressKaty Whiteheart NP  I agree with the resident/APP's  history, physical examination, assessment, and plans.    I have edited the above note and modified it according to our agreed history, physical examination, assessment and plan. Note as above. Pt with status asthmaticus 2/2 bronchitis, severe asthma exacerbation. VSS.  Still with wheezing. Edematous. Rest of exam as above. Cont propofol/fentanyl/paralytics drips. Cont azithromycin. Cont neb meds > will inc duoneb to 5 mg. May need hourlong alb 10 mg if hypercapnea worsens. Hold tf 2/2 paralytics. Do TOF and BISS. D/c NaHCo3 drip. 15 L (+). Mother updated at bedside.   I have spent 30  minutes of critical care time with this patient today.  Family :Family updated at length today.  Mother updated.    Pollie MeyerJ. Angelo A de Dios, MD 01/12/2016, 5:06 PM [facility] Pager [phone] After 3 pm or if no answer, call [phone]

## 2016-01-13 ENCOUNTER — Inpatient Hospital Stay (HOSPITAL_COMMUNITY): Payer: Self-pay

## 2016-01-13 DIAGNOSIS — J4552 Severe persistent asthma with status asthmaticus: Secondary | ICD-10-CM

## 2016-01-13 LAB — GLUCOSE, CAPILLARY
GLUCOSE-CAPILLARY: 124 mg/dL — AB (ref 65–99)
GLUCOSE-CAPILLARY: 138 mg/dL — AB (ref 65–99)
GLUCOSE-CAPILLARY: 143 mg/dL — AB (ref 65–99)
GLUCOSE-CAPILLARY: 151 mg/dL — AB (ref 65–99)
GLUCOSE-CAPILLARY: 156 mg/dL — AB (ref 65–99)
GLUCOSE-CAPILLARY: 157 mg/dL — AB (ref 65–99)
GLUCOSE-CAPILLARY: 164 mg/dL — AB (ref 65–99)
GLUCOSE-CAPILLARY: 167 mg/dL — AB (ref 65–99)
GLUCOSE-CAPILLARY: 171 mg/dL — AB (ref 65–99)
GLUCOSE-CAPILLARY: 172 mg/dL — AB (ref 65–99)
GLUCOSE-CAPILLARY: 175 mg/dL — AB (ref 65–99)
GLUCOSE-CAPILLARY: 177 mg/dL — AB (ref 65–99)
GLUCOSE-CAPILLARY: 200 mg/dL — AB (ref 65–99)
GLUCOSE-CAPILLARY: 202 mg/dL — AB (ref 65–99)
Glucose-Capillary: 165 mg/dL — ABNORMAL HIGH (ref 65–99)
Glucose-Capillary: 164 mg/dL — ABNORMAL HIGH (ref 65–99)
Glucose-Capillary: 139 mg/dL — ABNORMAL HIGH (ref 65–99)

## 2016-01-13 LAB — BLOOD GAS, ARTERIAL
Acid-Base Excess: 10.3 mmol/L — ABNORMAL HIGH (ref 0.0–2.0)
Acid-Base Excess: 13 mmol/L — ABNORMAL HIGH (ref 0.0–2.0)
BICARBONATE: 41.3 mmol/L — AB (ref 20.0–28.0)
BICARBONATE: 40.8 mmol/L — AB (ref 20.0–28.0)
Drawn by: 36496
Drawn by: 398661
FIO2: 100
FIO2: 80
LHR: 12 {breaths}/min
LHR: 14 {breaths}/min
O2 Saturation: 97 %
O2 Saturation: 99 %
PEEP: 5 cmH2O
PEEP: 5 cmH2O
Patient temperature: 98.6
Patient temperature: 98.6
VT: 460 mL
VT: 500 mL
pCO2 arterial: 102 mmHg (ref 32.0–48.0)
pH, Arterial: 7.02 — CL (ref 7.350–7.450)
pH, Arterial: 7.224 — ABNORMAL LOW (ref 7.350–7.450)
pO2, Arterial: 117 mmHg — ABNORMAL HIGH (ref 83.0–108.0)
pO2, Arterial: 335 mmHg — ABNORMAL HIGH (ref 83.0–108.0)

## 2016-01-13 LAB — CBC
HEMATOCRIT: 48.6 % (ref 39.0–52.0)
HEMOGLOBIN: 15 g/dL (ref 13.0–17.0)
MCH: 30.4 pg (ref 26.0–34.0)
MCHC: 30.9 g/dL (ref 30.0–36.0)
MCV: 98.6 fL (ref 78.0–100.0)
Platelets: 193 10*3/uL (ref 150–400)
RBC: 4.93 MIL/uL (ref 4.22–5.81)
RDW: 14 % (ref 11.5–15.5)
WBC: 10.7 10*3/uL — ABNORMAL HIGH (ref 4.0–10.5)

## 2016-01-13 LAB — BASIC METABOLIC PANEL
ANION GAP: 4 — AB (ref 5–15)
Anion gap: 9 (ref 5–15)
BUN: 25 mg/dL — AB (ref 6–20)
BUN: 29 mg/dL — AB (ref 6–20)
CHLORIDE: 96 mmol/L — AB (ref 101–111)
CHLORIDE: 98 mmol/L — AB (ref 101–111)
CO2: 29 mmol/L (ref 22–32)
CO2: 41 mmol/L — ABNORMAL HIGH (ref 22–32)
Calcium: 7.1 mg/dL — ABNORMAL LOW (ref 8.9–10.3)
Calcium: 7.6 mg/dL — ABNORMAL LOW (ref 8.9–10.3)
Creatinine, Ser: 1.01 mg/dL (ref 0.61–1.24)
Creatinine, Ser: 1.11 mg/dL (ref 0.61–1.24)
GFR calc Af Amer: >60 mL/min (ref >60)
GFR calc Af Amer: >60 mL/min (ref >60)
GFR calc non Af Amer: >60 mL/min (ref >60)
Glucose, Bld: 139 mg/dL — ABNORMAL HIGH (ref 65–99)
Glucose, Bld: 139 mg/dL — ABNORMAL HIGH (ref 65–99)
POTASSIUM: 6.6 mmol/L — AB (ref 3.5–5.1)
POTASSIUM: 5 mmol/L (ref 3.5–5.1)
SODIUM: 134 mmol/L — AB (ref 135–145)
SODIUM: 143 mmol/L (ref 135–145)

## 2016-01-13 LAB — CULTURE, RESPIRATORY W GRAM STAIN

## 2016-01-13 LAB — TRIGLYCERIDES
TRIGLYCERIDES: 68 mg/dL (ref <150)
Triglycerides: 66 mg/dL (ref <150)

## 2016-01-13 LAB — CULTURE, RESPIRATORY: CULTURE: NORMAL

## 2016-01-13 LAB — MAGNESIUM: MAGNESIUM: 2.8 mg/dL — AB (ref 1.7–2.4)

## 2016-01-13 LAB — PHOSPHORUS: Phosphorus: 6.1 mg/dL — ABNORMAL HIGH (ref 2.5–4.6)

## 2016-01-13 LAB — PROCALCITONIN: Procalcitonin: 1.57 ng/mL

## 2016-01-13 MED ORDER — INSULIN GLARGINE 100 UNIT/ML ~~LOC~~ SOLN
10.0000 [IU] | SUBCUTANEOUS | Status: DC
  Administered 2016-01-13: 10 [IU] via SUBCUTANEOUS
  Filled 2016-01-13 (×2): qty 0.1

## 2016-01-13 MED ORDER — DEXTROSE 5 % IV SOLN
250.0000 mg | INTRAVENOUS | Status: DC
  Administered 2016-01-13: 250 mg via INTRAVENOUS
  Filled 2016-01-13: qty 250

## 2016-01-13 MED ORDER — INSULIN ASPART 100 UNIT/ML ~~LOC~~ SOLN
2.0000 [IU] | SUBCUTANEOUS | Status: DC
  Administered 2016-01-13 (×3): 2 [IU] via SUBCUTANEOUS
  Administered 2016-01-14: 4 [IU] via SUBCUTANEOUS
  Administered 2016-01-14: 2 [IU] via SUBCUTANEOUS

## 2016-01-13 MED ORDER — SODIUM CHLORIDE 0.9% FLUSH
10.0000 mL | Freq: Two times a day (BID) | INTRAVENOUS | Status: DC
Start: 2016-01-13 — End: 2016-01-29
  Administered 2016-01-13: 30 mL
  Administered 2016-01-14 – 2016-01-15 (×3): 10 mL
  Administered 2016-01-16: 30 mL
  Administered 2016-01-16: 40 mL
  Administered 2016-01-17: 10 mL
  Administered 2016-01-17: 30 mL
  Administered 2016-01-18 – 2016-01-28 (×19): 10 mL

## 2016-01-13 MED ORDER — SODIUM CHLORIDE 0.9% FLUSH
10.0000 mL | INTRAVENOUS | Status: DC | PRN
Start: 2016-01-13 — End: 2016-01-29

## 2016-01-13 MED ORDER — BUDESONIDE 0.5 MG/2ML IN SUSP
0.5000 mg | Freq: Four times a day (QID) | RESPIRATORY_TRACT | Status: DC
  Administered 2016-01-13 – 2016-01-16 (×12): 0.5 mg via RESPIRATORY_TRACT
  Filled 2016-01-13 (×12): qty 2

## 2016-01-13 MED ORDER — PIPERACILLIN-TAZOBACTAM 3.375 G IVPB
3.3750 g | Freq: Three times a day (TID) | INTRAVENOUS | Status: DC
  Administered 2016-01-13 – 2016-01-15 (×6): 3.375 g via INTRAVENOUS
  Filled 2016-01-13 (×8): qty 50

## 2016-01-13 MED ORDER — DEXTROSE 10 % IV SOLN
INTRAVENOUS | Status: DC | PRN
  Administered 2016-01-13: 14:00:00 via INTRAVENOUS

## 2016-01-13 NOTE — Progress Notes (Signed)
Peripherally Inserted Central Catheter/Midline Placement  The IV Nurse has discussed with the patient and/or persons authorized to consent for the patient, the purpose of this procedure and the potential benefits and risks involved with this procedure.  The benefits include less needle sticks, lab draws from the catheter, and the patient may be discharged home with the catheter. Risks include, but not limited to, infection, bleeding, blood clot (thrombus formation), and puncture of an artery; nerve damage and irregular heartbeat and possibility to perform a PICC exchange if needed/ordered by physician.  Alternatives to this procedure were also discussed.  Bard Power PICC patient education guide, fact sheet on infection prevention and patient information card has been provided to patient /or left at bedside.    PICC/Midline Placement Documentation  PICC Triple Lumen 01/13/16 PICC Right Brachial 41 cm 0 cm (Active)  Indication for Insertion or Continuance of Line Vasoactive infusions;Prolonged intravenous therapies 01/13/2016  7:26 PM  Exposed Catheter (cm) 0 cm 01/13/2016  7:26 PM  Site Assessment Clean;Dry;Intact 01/13/2016  7:26 PM  Lumen #1 Status Flushed;Saline locked;Blood return noted 01/13/2016  7:26 PM  Lumen #2 Status Flushed;Saline locked;Blood return noted 01/13/2016  7:26 PM  Lumen #3 Status Flushed;Saline locked;Blood return noted 01/13/2016  7:26 PM  Dressing Type Transparent 01/13/2016  7:26 PM  Dressing Status Clean;Dry;Intact;Antimicrobial disc in place 01/13/2016  7:26 PM  Line Care Connections checked and tightened 01/13/2016  7:26 PM  Line Adjustment (NICU/IV Team Only) No 01/13/2016  7:26 PM  Dressing Intervention New dressing 01/13/2016  7:26 PM  Dressing Change Due 01/20/16 01/13/2016  7:26 PM       Elliot Dallyiggs, Hedi Barkan Wright 01/13/2016, 7:28 PM

## 2016-01-13 NOTE — Progress Notes (Signed)
eLink Physician-Brief Progress Note Patient Name: Henry Hardin DOB: 03/10/1985 MRN: [id number]   Date of Service  01/13/2016  HPI/Events of Note  Called for elevated potassium of 6.6.  Patient with status asthmaticus.  Creat 1 with good UOP.  No potassium in IVF's.  Nurse mentioned that may have been issue with sample.  eICU Interventions  Repeat bmp stat     Intervention Category Minor Interventions: Electrolytes abnormality - evaluation and management  Henry RusselSMITH, Annaly Skop, P 01/13/2016, 3:59 PM

## 2016-01-13 NOTE — Progress Notes (Signed)
eLink Physician-Brief Progress Note Patient Name: Henry Hardin DOB: 10/12/1985 MRN: [id number]   Date of Service  01/13/2016  HPI/Events of Note  Unable to draw blood for labs.  RT unable to place arterial line.   Frequent blood draws needed  eICU Interventions  Urgent PICC ordered     Intervention Category Evaluation Type: Other  Henry RusselSMITH, Louanne Calvillo, P 01/13/2016, 5:25 PM

## 2016-01-13 NOTE — Progress Notes (Signed)
S: Notified by RT that patient has had difficulty ventilating with extremely high peak pressures and worsening respiratory acidosis.  O: On exam, patient had strikingly long expiratory phase with loud diffuse wheezes. Patient paralyzed. On vent, Vt of 500, rate of 20. Significant autoPEEP (>25), high plateau pressures.  A/P: Severe obstructive lung disease, difficult to ventilate. I disconnected patient from ventilator to allow for some passive prolonged expiration in an attempt to reduce dynamic hyperinflation. Over the course over the next several hours, I made adjustments to the ventilator (iTime, Vt, FiO2, RR) and changed mode to volume control in attempt to improve ventilation. Ajdustments were made to the circuit to reduce deadspace, with some improvement.  CRITICAL CARE Performed by: Jamie KatoRIMBLE, Ovadia Lopp  Total critical care time: 45 minutes  Critical care time was exclusive of separately billable procedures and treating other patients.  Critical care was necessary to treat or prevent imminent or life-threatening deterioration.  Critical care was time spent personally by me on the following activities: development of treatment plan with patient and/or surrogate as well as nursing, discussions with consultants, evaluation of patient's response to treatment, examination of patient, obtaining history from patient or surrogate, ordering and performing treatments and interventions, ordering and review of laboratory studies, ordering and review of radiographic studies, pulse oximetry and re-evaluation of patient's condition.  Jamie KatoAaron Tawnia Schirm, MD Pulmonary & Critical Care Medicine January 13, 2016, 8:05 PM

## 2016-01-13 NOTE — Progress Notes (Signed)
   [facility] PCCM  Rpt abg was better. 7.12 / elevated CO2 / 103 / 42 His MV was 6. Plateau 30. DP was 25.  Still wheezing but better.   Increased his rate from 14 to 16, dropped his Fio2 from 70% to 50%. On TV 500, PEEP 5.  Keep O2 sats > 88-90 %  Updated fiancee, ex-wife, and mother.   Rpt abg later this pm.   Pollie MeyerJ. Angelo A de Dios, MD 01/13/2016, 2:08 PM [facility] Pager [phone] After 3 pm or if no answer, call [phone]

## 2016-01-13 NOTE — Plan of Care (Signed)
Problem: ICU Phase Progression Outcomes Goal: Hemodynamically stable Outcome: Progressing BP controlled without gtt medication Goal: Voiding-avoid urinary catheter unless indicated Outcome: Not Progressing Catheter indicated for paralyzed pt.

## 2016-01-13 NOTE — Progress Notes (Signed)
PULMONARY / CRITICAL CARE MEDICINE   Name: Henry Hardin MRN: [id number] DOB: 12/19/1985    ADMISSION DATE:  01/09/2016 CONSULTATION DATE:  01/10/2016  REFERRING MD:  Dr. Ethelda ChickJacubowitz  CHIEF COMPLAINT:  SOB  HISTORY OF PRESENT ILLNESS:   30 year old male with PMH asthma since childhood. He has never been consistent with taking his controller medications and has been unable to get albuterol due to insurance issues per mother. He has been smoking about a pack per day for the past 6 years during which time his symptoms have gotten worse. He has been seen in the ED 8 times in the past 6 months with exacerbations of asthma. He has been intubated once in the past. 11/22 he presented to the ED with CC respiratory distress for about 3 hours. He was treated with mag, albuterol, and atrovent by EMS. He was additionally treated with solumedrol, and additional bronchodilators in ED without improvement. His mental status began to worsen and he required intubation. PCCM asked to admit.   SUBJECTIVE:  Difficult to ventilate overnight. Had to go up on paralytics. Vent settings changed. Increased ET secretions, foul smelling D/C per ET.    VITAL SIGNS: BP 139/76 (BP Location: Right Arm)   Pulse 90   Temp 97.6 F (36.4 C) (Oral)   Resp 12   Ht 6\' 1"  (1.854 m)   Wt 90 kg (198 lb 6.6 oz)   SpO2 96%   BMI 26.18 kg/m   HEMODYNAMICS:    VENTILATOR SETTINGS: Vent Mode: Volume support FiO2 (%):  [40 %-100 %] 70 % Set Rate:  [12 bmp-26 bmp] 12 bmp Vt Set:  [370 mL-640 mL] 500 mL PEEP:  [5 cmH20-8 cmH20] 5 cmH20 Plateau Pressure:  [14 cmH20-40 cmH20] 40 cmH20  INTAKE / OUTPUT: I/O last 3 completed shifts: In: 13046.5 [I.V.:11298.3; NW/GN:5621.3G/GT:1548.2; IV Piggyback:200] Out: 3345 [Urine:3345]  PHYSICAL EXAMINATION: General:  Young fit male in NAD on vent Neuro:  Sedated, paralyzed, RASS -4 HEENT:  [place]/AT, PERRL, no JVD Cardiovascular:  Tachy, regular, no MRG Lungs:  resps even non labored on vent,  ongoing diffuse wheeze  Abdomen:  Soft, non-tender, non-distended Musculoskeletal:  No acute deformity or ROM limitation Skin:  Grossly intact  LABS:  BMET  Recent Labs Lab 01/09/16 2246 01/11/16 0500 01/12/16 0558  NA 140 138 142  K 4.0 4.5 3.7  CL 106 99* 96*  CO2 26 28 33*  BUN 12 23* 19  CREATININE 1.13 1.60* 1.08  GLUCOSE 143* 299* 176*    Electrolytes  Recent Labs Lab 01/09/16 2246  01/11/16 0500 01/11/16 1700 01/12/16 0558  CALCIUM 8.5*  --  8.1*  --  8.1*  MG  --   < > 2.9* 2.5* 2.4  PHOS  --   < > 5.2* 1.8* 2.2*  < > = values in this interval not displayed.  CBC  Recent Labs Lab 01/09/16 2246 01/11/16 0500 01/12/16 0558  WBC 9.9 13.9* 15.1*  HGB 18.0* 13.2 12.7*  HCT 52.4* 41.0 39.6  PLT 289 213 211    Coag's No results for input(s): APTT, INR in the last 168 hours.  Sepsis Markers  Recent Labs Lab 01/10/16 1430 01/11/16 0000 01/11/16 0500 01/12/16 0558  LATICACIDVEN  --  1.3  --   --   PROCALCITON 2.89  --  2.28 1.20    ABG  Recent Labs Lab 01/13/16 0005 01/13/16 0502 01/13/16 0605  PHART 7.224* 7.020* 7.080*  PCO2ART 102* ABOVE REPORTABLE BELOW REPORTABLE RANGE  PO2ART 117* 335* 93.1    Liver Enzymes No results for input(s): AST, ALT, ALKPHOS, BILITOT, ALBUMIN in the last 168 hours.  Cardiac Enzymes  Recent Labs Lab 01/10/16 1430 01/10/16 1957 01/11/16 0145  TROPONINI <0.03 <0.03 <0.03    Glucose  Recent Labs Lab 01/13/16 0715 01/13/16 0806 01/13/16 0905 01/13/16 1010 01/13/16 1105 01/13/16 1201  GLUCAP 177* 172* 156* 157* 151* 167*    Imaging No results found.   STUDIES:    CULTURES: MRSA (-) Trache asp 11/24 > (-) Blood 11/24 (-) Trache 11/26  >   ANTIBIOTICS: Azithro 11/24 > 11/26 Zosyn 11/26 >   SIGNIFICANT EVENTS: 11/22 intubated for asthma 11/24 airtrapping  LINES/TUBES: ETT 11/23 >  DISCUSSION: 30 year old male smoker with asthma since childhood, poorly compliant r/t cost.  Frequent visits/admissions.  Admitted 11/23 with status asthmaticus requiring intubation. Permissive hypercarbia and one dose nmb 11/24  ASSESSMENT / PLAN:  Acute hypercarbic respiratory failure secondary to status asthmaticus:  No focal opacification on CXR. Bronchitis. Last 24 hrs (11/26) > increased ET secretions, foul smelling ET secretions. Concer for HCAP Difficult to ventilate last 24 hrs.   Plan: Full vent support with PRVC. Send for trache culture. Start zosyn. D/c azithro (got 3 days) Heavy sedation  Continue NMB gtt for now  Scheduled duonebs and prn alb. Cont brovana. Add pulmicort qid (higher dose) Continue Solumedrol 60mg  IV q6h Repeat ABG now > currently on TV 500, Rate 12, PEEP 5, Fio2 70% > may try PCV or APRV Needs to quit smoking ASAP Will need pulmonary follow up at discharge Will check echo to be thorough (make sure we are not missing chf)  Acute metabolic encephalopathy Plan: Correct acid/base with vent Propofol/fentanyl gtts  RASS goal -1 to -2.  Cont paralytics Off TF 2/2 paralytics (started 11/24)   Hypotension -Resolved.  likely r/t high dose propofol required for vent synchrony  Plan:  Minimize air trapping Wean propofol/fentanyl as able   Best practice VTE ppx: SCDs and SQ heparin Diet: NPO      I have spent 30  minutes of critical care time with this patient today.  Family :Family updated at length today.  Updated girlfriend at bedside. Pt has an ex wife and mother involved with his care.    Pollie MeyerJ. Angelo A de Dios, MD 01/13/2016, 1:05 PM [facility] Pager [phone] After 3 pm or if no answer, call [phone]

## 2016-01-13 NOTE — Progress Notes (Signed)
Pharmacy Antibiotic Note  Henry Hardin is a 30 y.o. male admitted on 01/09/2016 with pneumonia.  Pharmacy has been consulted for Zosyn dosing.  Plan: Zosyn 3.375g IV q8h (4 hour infusion).  Monitor culture data, renal function and clinical course  Height: 6\' 1"  (185.4 cm) Weight: 198 lb 6.6 oz (90 kg) IBW/kg (Calculated) : 79.9  Temp (24hrs), Avg:98 F (36.7 C), Min:97.6 F (36.4 C), Max:98.5 F (36.9 C)   Recent Labs Lab 01/09/16 2246 01/11/16 0000 01/11/16 0500 01/12/16 0558  WBC 9.9  --  13.9* 15.1*  CREATININE 1.13  --  1.60* 1.08  LATICACIDVEN  --  1.3  --   --     Estimated Creatinine Clearance: 115.1 mL/min (by C-G formula based on SCr of 1.08 mg/dL).    Allergies  Allergen Reactions  . Bee Venom Anaphylaxis, Itching and Swelling    Antimicrobials this admission: Zosyn 11/26 >>  Azith 11/23 >> 11/26  Dose adjustments this admission: n/a  Microbiology results: 11/23 MRSA PCR - negative 11/23 BCx x2 - ngtd 11/23 TA - normal respiratory flora 11/23 resp panel - negative  Arlean Hoppingorey M. Newman PiesBall, PharmD, BCPS Clinical Pharmacist Pager [phone] 01/13/2016 1:26 PM

## 2016-01-13 NOTE — Progress Notes (Signed)
RT NOTE:  Critical ABG results reported to Dr. Darrick Pennaeterding. Will remain on current settings, redraw ABG in AM.

## 2016-01-13 NOTE — Progress Notes (Signed)
Critical lab value K 6.6 [facility].  Notified Dr Katrinka BlazingSmith of this and Na 134.

## 2016-01-13 NOTE — Progress Notes (Signed)
Error in charting. ABG at 0605 on 01/13/16 CO2 is Above Reportable Range.

## 2016-01-13 NOTE — Progress Notes (Signed)
RT NOTE:  ABG drawn and resulted on ISTAT @ 2108. Results above reportable range. Sample amount was not enough to send to department for resulting. Results @ 37.0C pH  - 7.275 CO2 - > 100.00 PO2 -   99  Dr. Eugenia Pancoastrimble was @ bedside and reviewed results. Dr. Rip Harbourk with results, did not want secondary result in RT department. No changed to be made at this time.   [facility]G [facility].

## 2016-01-14 ENCOUNTER — Other Ambulatory Visit (HOSPITAL_COMMUNITY): Payer: Self-pay

## 2016-01-14 LAB — BLOOD GAS, ARTERIAL
ACID-BASE EXCESS: 17.3 mmol/L — AB (ref 0.0–2.0)
Acid-Base Excess: 11.7 mmol/L — ABNORMAL HIGH (ref 0.0–2.0)
Acid-Base Excess: 13.1 mmol/L — ABNORMAL HIGH (ref 0.0–2.0)
BICARBONATE: 43.2 mmol/L — AB (ref 20.0–28.0)
Bicarbonate: 41.7 mmol/L — ABNORMAL HIGH (ref 20.0–28.0)
Bicarbonate: 42.7 mmol/L — ABNORMAL HIGH (ref 20.0–28.0)
DRAWN BY: 398661
DRAWN BY: 244851
Drawn by: 39899
FIO2: 70
FIO2: 70
FIO2: 50
LHR: 12 {breaths}/min
MECHVT: 500 mL
MECHVT: 700 mL
O2 SAT: 94 %
O2 SAT: 87.3 %
O2 Saturation: 96.2 %
PEEP/CPAP: 5 cmH2O
PEEP/CPAP: 5 cmH2O
PEEP: 5 cmH2O
PH ART: 7.08 — AB (ref 7.350–7.450)
PH ART: 7.397 (ref 7.350–7.450)
PO2 ART: 103 mmHg (ref 83.0–108.0)
Patient temperature: 98.6
Patient temperature: 97.6
Patient temperature: 98.6
RATE: 12 resp/min
RATE: 12 resp/min
VT: 500 mL
pCO2 arterial: 71.7 mmHg (ref 32.0–48.0)
pH, Arterial: 7.12 — CL (ref 7.350–7.450)
pO2, Arterial: 93.1 mmHg (ref 83.0–108.0)
pO2, Arterial: 53.3 mmHg — ABNORMAL LOW (ref 83.0–108.0)

## 2016-01-14 LAB — GLUCOSE, CAPILLARY
GLUCOSE-CAPILLARY: 142 mg/dL — AB (ref 65–99)
GLUCOSE-CAPILLARY: 137 mg/dL — AB (ref 65–99)
Glucose-Capillary: 97 mg/dL (ref 65–99)
Glucose-Capillary: 193 mg/dL — ABNORMAL HIGH (ref 65–99)
Glucose-Capillary: 145 mg/dL — ABNORMAL HIGH (ref 65–99)
Glucose-Capillary: 129 mg/dL — ABNORMAL HIGH (ref 65–99)

## 2016-01-14 LAB — BASIC METABOLIC PANEL
Anion gap: 4 — ABNORMAL LOW (ref 5–15)
BUN: 28 mg/dL — AB (ref 6–20)
CHLORIDE: 98 mmol/L — AB (ref 101–111)
CO2: 41 mmol/L — ABNORMAL HIGH (ref 22–32)
CREATININE: 1.1 mg/dL (ref 0.61–1.24)
Calcium: 7.7 mg/dL — ABNORMAL LOW (ref 8.9–10.3)
GFR calc Af Amer: >60 mL/min (ref >60)
GLUCOSE: 168 mg/dL — AB (ref 65–99)
POTASSIUM: 4.8 mmol/L (ref 3.5–5.1)
SODIUM: 143 mmol/L (ref 135–145)

## 2016-01-14 LAB — CBC
HCT: 37.4 % — ABNORMAL LOW (ref 39.0–52.0)
Hemoglobin: 11.6 g/dL — ABNORMAL LOW (ref 13.0–17.0)
MCH: 30 pg (ref 26.0–34.0)
MCHC: 31 g/dL (ref 30.0–36.0)
MCV: 96.6 fL (ref 78.0–100.0)
PLATELETS: 170 10*3/uL (ref 150–400)
RBC: 3.87 MIL/uL — ABNORMAL LOW (ref 4.22–5.81)
RDW: 13.7 % (ref 11.5–15.5)
WBC: 10.9 10*3/uL — ABNORMAL HIGH (ref 4.0–10.5)

## 2016-01-14 LAB — PHOSPHORUS: Phosphorus: 2.2 mg/dL — ABNORMAL LOW (ref 2.5–4.6)

## 2016-01-14 LAB — PROCALCITONIN: PROCALCITONIN: 1.9 ng/mL

## 2016-01-14 LAB — MAGNESIUM: Magnesium: 2.6 mg/dL — ABNORMAL HIGH (ref 1.7–2.4)

## 2016-01-14 NOTE — Care Management Note (Signed)
Case Management Note  Patient Details  Name: Henry Hardin MRN: [id number] Date of Birth: 1985-03-15  Subjective/Objective:  Pt admitted on 01/09/16 with status asthmaticus.  PTA, pt independent, lives with spouse.                    Action/Plan: Met with pt's wife, at bedside; she states pt noncompliant with meds, and does not have PCP.  She states he has trouble affording meds due to not having insurance.  Suggested pt follow up at [place] for PCP follow up, and wife would like for pt to do this.  She states he must start being compliant if he wants to live.  Case management to follow up with pt upon medical stability to discuss PCP follow up and medication assistance.  Wife appreciative of my visit.    Expected Discharge Date:                  Expected Discharge Plan:  Home/Self Care  In-House Referral:     Discharge planning Services  CM Consult  Post Acute Care Choice:    Choice offered to:     DME Arranged:    DME Agency:     HH Arranged:    HH Agency:     Status of Service:  In process, will continue to follow  If discussed at Long Length of Stay Meetings, dates discussed:    Additional Comments:  Reinaldo Raddle, RN, BSN  Trauma/Neuro ICU Case Manager [phone]

## 2016-01-14 NOTE — Progress Notes (Signed)
7 AM blood gas results ( pH 7.080, CO2 too high to register) and current vent settings discussed with Dr. Delton CoombesByrum. No orders received.

## 2016-01-14 NOTE — Progress Notes (Signed)
PULMONARY / CRITICAL CARE MEDICINE   Name: Henry Hardin MRN: [id number] DOB: 07/08/1985    ADMISSION DATE:  01/09/2016 CONSULTATION DATE:  01/10/2016  REFERRING MD:  Dr. Ethelda ChickJacubowitz  CHIEF COMPLAINT:  SOB  HISTORY OF PRESENT ILLNESS:   30 year old male with PMH asthma since childhood. He has never been consistent with taking his controller medications and has been unable to get albuterol due to insurance issues per mother. He has been smoking about a pack per day for the past 6 years during which time his symptoms have gotten worse. He has been seen in the ED 8 times in the past 6 months with exacerbations of asthma. He has been intubated once in the past. 11/22 he presented to the ED with CC respiratory distress for about 3 hours. He was treated with mag, albuterol, and atrovent by EMS. He was additionally treated with solumedrol, and additional bronchodilators in ED without improvement. His mental status began to worsen and he required intubation. PCCM asked to admit.   SUBJECTIVE:  Difficult to ventilate overnight. Had to go up on paralytics. Vent settings changed. Increased ET secretions, foul smelling D/C per ET.    VITAL SIGNS: BP (!) 130/54   Pulse (!) 103   Temp 99.1 F (37.3 C) (Axillary)   Resp 16   Ht 6\' 1"  (1.854 m)   Wt 91.8 kg (202 lb 6.1 oz)   SpO2 97%   BMI 26.70 kg/m   HEMODYNAMICS:    VENTILATOR SETTINGS: Vent Mode: PRVC FiO2 (%):  [50 %-70 %] 70 % Set Rate:  [16 bmp] 16 bmp Vt Set:  [500 mL] 500 mL PEEP:  [5 cmH20] 5 cmH20 Plateau Pressure:  [18 cmH20-35 cmH20] 18 cmH20  INTAKE / OUTPUT: I/O last 3 completed shifts: In: 5688.5 [I.V.:5263.5; IV Piggyback:425] Out: 2470 [Urine:2470]  PHYSICAL EXAMINATION: General:  Young fit male in NAD on vent Neuro:  Sedated, paralyzed, RASS -4 HEENT:  [place]/AT, PERRL, no JVD Cardiovascular:  Tachy, regular, no MRG Lungs:  resps even non labored on vent, ongoing diffuse wheeze  Abdomen:  Soft, non-tender,  non-distended Musculoskeletal:  No acute deformity or ROM limitation Skin:  Grossly intact  LABS:  BMET  Recent Labs Lab 01/13/16 1411 01/13/16 1944 01/14/16 0530  NA 134* 143 143  K 6.6* 5.0 4.8  CL 96* 98* 98*  CO2 29 41* 41*  BUN 25* 29* 28*  CREATININE 1.01 1.11 1.10  GLUCOSE 139* 139* 168*    Electrolytes  Recent Labs Lab 01/12/16 0558 01/13/16 1411 01/13/16 1944 01/14/16 0530  CALCIUM 8.1* 7.1* 7.6* 7.7*  MG 2.4 2.8*  --  2.6*  PHOS 2.2* 6.1*  --  2.2*    CBC  Recent Labs Lab 01/12/16 0558 01/13/16 1450 01/14/16 0454  WBC 15.1* 10.7* 10.9*  HGB 12.7* 15.0 11.6*  HCT 39.6 48.6 37.4*  PLT 211 193 170    Coag's No results for input(s): APTT, INR in the last 168 hours.  Sepsis Markers  Recent Labs Lab 01/11/16 0000  01/12/16 0558 01/13/16 1450 01/14/16 0454  LATICACIDVEN 1.3  --   --   --   --   PROCALCITON  --   < > 1.20 1.57 1.90  < > = values in this interval not displayed.  ABG  Recent Labs Lab 01/13/16 0502 01/13/16 0605 01/13/16 1315  PHART 7.020* 7.080* 7.120*  PCO2ART ABOVE REPORTABLE ABOVE REPORTABLE RANGE 0.00*  PO2ART 335* 93.1 103    Liver Enzymes No results for  input(s): AST, ALT, ALKPHOS, BILITOT, ALBUMIN in the last 168 hours.  Cardiac Enzymes  Recent Labs Lab 01/10/16 1430 01/10/16 1957 01/11/16 0145  TROPONINI <0.03 <0.03 <0.03    Glucose  Recent Labs Lab 01/13/16 1406 01/13/16 1612 01/13/16 2023 01/14/16 0046 01/14/16 0409 01/14/16 0842  GLUCAP 143* 139* 124* 97 142* 193*    Imaging Dg Chest Port 1 View  Result Date: 01/13/2016 CLINICAL DATA:  Oxygen desaturation. EXAM: PORTABLE CHEST 1 VIEW COMPARISON:  Radiographs of January 12, 2016. FINDINGS: The heart size and mediastinal contours are within normal limits. Endotracheal and nasogastric tubes are in good position. Interval placement of right-sided PICC line with distal tip in expected position of the SVC. Both lungs are clear. The  visualized skeletal structures are unremarkable. IMPRESSION: Stable support apparatus. Interval placement of right-sided PICC line with tip in expected position of the SVC. No acute cardiopulmonary abnormality seen. Electronically Signed   By: Lupita RaiderJames  Green Jr, M.D.   On: 01/13/2016 21:54   Dg Abd Portable 1v  Result Date: 01/14/2016 CLINICAL DATA:  [age] y/o  M; enteric tube placement. EXAM: PORTABLE ABDOMEN - 1 VIEW COMPARISON:  None. FINDINGS: Enteric tube tip projects over the distal stomach. Normal bowel gas pattern. Bones are unremarkable. IMPRESSION: Enteric tube tip projects over the distal stomach. Electronically Signed   By: Mitzi HansenLance  Furusawa-Stratton M.D.   On: 01/14/2016 00:24     STUDIES:    CULTURES: MRSA (-) Trache asp 11/24 > (-) Blood 11/24 (-) Trache 11/26  >   ANTIBIOTICS: Azithro 11/24 > 11/26 Zosyn 11/26 >   SIGNIFICANT EVENTS: 11/22 intubated for asthma 11/24 airtrapping  LINES/TUBES: ETT 11/23 >  DISCUSSION: 30 year old male smoker with asthma since childhood, poorly compliant r/t cost. Frequent visits/admissions.  Admitted 11/23 with status asthmaticus requiring intubation. Has required NMB, continues to have significant resp acidosis  ASSESSMENT / PLAN:  Acute hypercarbic respiratory failure secondary to status asthmaticus:  No focal opacification on CXR.  Bronchitis, at risk evolving PNA Severe Resp acidosis  Plan: Full vent support with PRVC, will attempt to increase his Te and improve ventilation. May need to change modes. I changed him to PRVC 700 x 12, Ti 0.7, PEEP 5, Fio2 0.50. Auto PEEP ==2. Will continue this for now and check ABG at 13:15 Continue empiric zosyn. D/c'd  azithro 11/26 (got 3 days) Heavy sedation  And Continue NMB gtt for now  Scheduled duonebs and prn alb. Cont brovana and pulmicort qid (higher dose) Continue Solumedrol 60mg  IV q6h Needs to quit smoking ASAP Will need pulmonary follow up at discharge TTE pending to r/o cardiac  contribution  Acute metabolic encephalopathy Plan: Correct acid/base Propofol/fentanyl gtts  RASS goal -5 Cont paralytics for now Off TF 2/2 paralytics (started 11/24)   Hypotension -Resolved.  likely r/t high dose propofol required for vent synchrony. Note aggressive volume resuscitation ( 18L positive) Plan:  Minimize air trapping Lighten sedation when he can tolerate May merit Diuresis at this point  Best practice VTE ppx: SCDs and SQ heparin Diet: NPO     Family: Updated his wife (separated) at bedside 11/27   Independent CC time 40 minutes  Levy Pupaobert Marlys Stegmaier, MD, PhD 01/14/2016, 12:36 PM [facility] [phone] or if no answer [phone]

## 2016-01-15 ENCOUNTER — Other Ambulatory Visit (HOSPITAL_COMMUNITY): Payer: Self-pay

## 2016-01-15 LAB — BLOOD GAS, ARTERIAL
ACID-BASE EXCESS: 13.2 mmol/L — AB (ref 0.0–2.0)
Bicarbonate: 38.7 mmol/L — ABNORMAL HIGH (ref 20.0–28.0)
DRAWN BY: 398991
FIO2: 50
MECHVT: 700 mL
O2 SAT: 96 %
PATIENT TEMPERATURE: 98.6
PCO2 ART: 64.4 mmHg — AB (ref 32.0–48.0)
PEEP/CPAP: 5 cmH2O
PH ART: 7.396 (ref 7.350–7.450)
RATE: 12 resp/min
pO2, Arterial: 84.8 mmHg (ref 83.0–108.0)

## 2016-01-15 LAB — PROCALCITONIN: Procalcitonin: 1.69 ng/mL

## 2016-01-15 LAB — CBC
HEMATOCRIT: 34.8 % — AB (ref 39.0–52.0)
HEMOGLOBIN: 10.8 g/dL — AB (ref 13.0–17.0)
MCH: 29.7 pg (ref 26.0–34.0)
MCHC: 31 g/dL (ref 30.0–36.0)
MCV: 95.6 fL (ref 78.0–100.0)
Platelets: 179 10*3/uL (ref 150–400)
RBC: 3.64 MIL/uL — ABNORMAL LOW (ref 4.22–5.81)
RDW: 14.5 % (ref 11.5–15.5)
WBC: 10.4 10*3/uL (ref 4.0–10.5)

## 2016-01-15 LAB — BASIC METABOLIC PANEL
Anion gap: 7 (ref 5–15)
BUN: 32 mg/dL — AB (ref 6–20)
CHLORIDE: 100 mmol/L — AB (ref 101–111)
CO2: 38 mmol/L — AB (ref 22–32)
CREATININE: 1.1 mg/dL (ref 0.61–1.24)
Calcium: 8.2 mg/dL — ABNORMAL LOW (ref 8.9–10.3)
GFR calc Af Amer: >60 mL/min (ref >60)
GFR calc non Af Amer: >60 mL/min (ref >60)
GLUCOSE: 158 mg/dL — AB (ref 65–99)
Potassium: 4.9 mmol/L (ref 3.5–5.1)
Sodium: 145 mmol/L (ref 135–145)

## 2016-01-15 LAB — CULTURE, BLOOD (ROUTINE X 2)
CULTURE: NO GROWTH
Culture: NO GROWTH

## 2016-01-15 LAB — CULTURE, RESPIRATORY W GRAM STAIN

## 2016-01-15 LAB — CULTURE, RESPIRATORY: SPECIAL REQUESTS: NORMAL

## 2016-01-15 LAB — MAGNESIUM: Magnesium: 2.7 mg/dL — ABNORMAL HIGH (ref 1.7–2.4)

## 2016-01-15 LAB — GLUCOSE, CAPILLARY: GLUCOSE-CAPILLARY: 172 mg/dL — AB (ref 65–99)

## 2016-01-15 MED ORDER — VITAL AF 1.2 CAL PO LIQD
1000.0000 mL | ORAL | Status: DC
  Administered 2016-01-15 – 2016-01-21 (×7): 1000 mL
  Filled 2016-01-15 (×3): qty 1000

## 2016-01-15 MED ORDER — CEFAZOLIN SODIUM-DEXTROSE 2-4 GM/100ML-% IV SOLN
2.0000 g | Freq: Three times a day (TID) | INTRAVENOUS | Status: AC
  Administered 2016-01-15 – 2016-01-25 (×32): 2 g via INTRAVENOUS
  Filled 2016-01-15 (×34): qty 100

## 2016-01-15 MED ORDER — MONTELUKAST SODIUM 10 MG PO TABS
10.0000 mg | ORAL_TABLET | Freq: Every day | ORAL | Status: DC
  Administered 2016-01-15 – 2016-01-16 (×2): 10 mg
  Filled 2016-01-15 (×2): qty 1

## 2016-01-15 MED ORDER — VITAL AF 1.2 CAL PO LIQD
1000.0000 mL | ORAL | Status: DC
  Administered 2016-01-15: 1000 mL
  Filled 2016-01-15: qty 1000

## 2016-01-15 MED ORDER — VANCOMYCIN HCL IN DEXTROSE 1-5 GM/200ML-% IV SOLN
1000.0000 mg | Freq: Once | INTRAVENOUS | Status: AC
  Administered 2016-01-15: 1000 mg via INTRAVENOUS
  Filled 2016-01-15: qty 200

## 2016-01-15 MED ORDER — PRO-STAT SUGAR FREE PO LIQD
30.0000 mL | Freq: Two times a day (BID) | ORAL | Status: DC
  Administered 2016-01-15: 30 mL
  Filled 2016-01-15: qty 30

## 2016-01-15 MED ORDER — PRO-STAT SUGAR FREE PO LIQD
30.0000 mL | Freq: Three times a day (TID) | ORAL | Status: DC
Start: 2016-01-15 — End: 2016-01-29
  Administered 2016-01-15 – 2016-01-29 (×25): 30 mL
  Filled 2016-01-15 (×30): qty 30

## 2016-01-15 NOTE — Progress Notes (Signed)
Ventilator circuit changed due to noted water in tubing and low PEEP alarm continuously alarming.  Patient tolerated well.  Ventilator no longer alarming.  Patient has began to open eyes and placed on PSV/CPAP of 15/5.  Currently tolerating well.  Will continue to monitor.

## 2016-01-15 NOTE — Progress Notes (Addendum)
Pharmacy Antibiotic Note  Henry Hardin is a 30 y.o. male admitted on 01/09/2016 with pneumonia.   Patient is heavily sedated/paralyzed on vent with suspected asthma exacerbation. Currently on zosyn, but resp cx now growing staph aureus with sensitivities pending  UPDATE: culture now shows MSSA - no need for further vanc; CONSIDER CHANGE TO cefazolin or ceftriaxone and d/c ZOSYN  Plan: Continue zosyn  Add vanc 1 g x 1 dose F/U sensitivities for MSSA vs MRSA  Height: 6\' 1"  (185.4 cm) Weight: 199 lb 15.3 oz (90.7 kg) IBW/kg (Calculated) : 79.9  Temp (24hrs), Avg:99.6 F (37.6 C), Min:99.1 F (37.3 C), Max:100.9 F (38.3 C)   Recent Labs Lab 01/11/16 0000 01/11/16 0500 01/12/16 0558 01/13/16 1411 01/13/16 1450 01/13/16 1944 01/14/16 0454 01/14/16 0530 01/15/16 0500  WBC  --  13.9* 15.1*  --  10.7*  --  10.9*  --  10.4  CREATININE  --  1.60* 1.08 1.01  --  1.11  --  1.10 1.10  LATICACIDVEN 1.3  --   --   --   --   --   --   --   --     Estimated Creatinine Clearance: 113 mL/min (by C-G formula based on SCr of 1.1 mg/dL).    Allergies  Allergen Reactions  . Bee Venom Anaphylaxis, Itching and Swelling    Isaac BlissMichael Spencer Cardinal, PharmD, BCPS, BCCCP Clinical Pharmacist Clinical phone for 01/15/2016 from 7a-3:30p: [phone] If after 3:30p, please call main pharmacy at: [phone] 01/15/2016 8:19 AM

## 2016-01-15 NOTE — Progress Notes (Signed)
Nutrition Follow-up  INTERVENTION:   Vital AF 1.2 @ 45 ml/hr (1080 ml/day) 30 ml Prostat TID Provides: 1596 kcal, 126 grams protein, and 875 ml H2O.  TF regimen and propofol at current rate providing 2142 total kcal/day   NUTRITION DIAGNOSIS:   Inadequate oral intake related to inability to eat as evidenced by NPO status. Ongoing.   GOAL:   Patient will meet greater than or equal to 90% of their needs Progressing.   MONITOR:   TF tolerance, Diet advancement, Vent status, Labs, I & O's  ASSESSMENT:   30 y/o male PMHx Asthma w/ poor Compliance and tobacco abuse. Presented to ED w/ respiratory distress. Eventually required intubation. RD consulted for TF.   Pt discussed during ICU rounds and with RN.  11/24 TF turned off for neuro-blockade 11/28 neuro-blockade d/c'ed, wean sedation as tolerated  Patient is currently intubated on ventilator support MV: 5.9 L/min Temp (24hrs), Avg:99.5 F (37.5 C), Min:99.1 F (37.3 C), Max:100 F (37.8 C)  Propofol: 20.7 ml/hr provides: 546 kcal per day from lipid Medications reviewed and include: solumedrol Labs reviewed: magnesium 2.7 CBG"s: [phone]   Diet Order:  Diet NPO time specified  Skin:  Reviewed, no issues  Last BM:  11/22  Height:   Ht Readings from Last 1 Encounters:  01/09/16 6\' 1"  (1.854 m)    Weight:   Wt Readings from Last 1 Encounters:  01/15/16 199 lb 15.3 oz (90.7 kg)    Ideal Body Weight:  83.64 kg  BMI:  Body mass index is 26.38 kg/m.  Estimated Nutritional Needs:   Kcal:  2138  Protein:  112-128 g (1.4-1.6 g/kg bw)  Fluid:  Per MD  EDUCATION NEEDS:   No education needs identified at this time  Kendell BaneHeather Anie Juniel RD, LDN, CNSC [phone] Pager [phone] After Hours Pager

## 2016-01-15 NOTE — Progress Notes (Addendum)
PULMONARY / CRITICAL CARE MEDICINE   Name: Henry Hardin MRN: [id number] DOB: 07/06/1985    ADMISSION DATE:  01/09/2016 CONSULTATION DATE:  01/10/2016  REFERRING MD:  Dr. Ethelda ChickJacubowitz  CHIEF COMPLAINT:  SOB  HISTORY OF PRESENT ILLNESS:   30 year old male with PMH asthma since childhood. He has never been consistent with taking his controller medications and has been unable to get albuterol due to insurance issues per mother. He has been smoking about a pack per day for the past 6 years during which time his symptoms have gotten worse. He has been seen in the ED 8 times in the past 6 months with exacerbations of asthma. He has been intubated once in the past. 11/22 he presented to the ED with CC respiratory distress for about 3 hours. He was treated with mag, albuterol, and atrovent by EMS. He was additionally treated with solumedrol, and additional bronchodilators in ED without improvement. His mental status began to worsen and he required intubation. PCCM asked to admit.   SUBJECTIVE:  Improved ventilation 11/27 after increasing exp time.  Remains paralyzed.   VITAL SIGNS: BP (!) 148/71   Pulse 75   Temp 100 F (37.8 C) (Axillary)   Resp 12   Ht 6\' 1"  (1.854 m)   Wt 90.7 kg (199 lb 15.3 oz)   SpO2 94%   BMI 26.38 kg/m   HEMODYNAMICS:    VENTILATOR SETTINGS: Vent Mode: PRVC FiO2 (%):  [50 %-70 %] 50 % Set Rate:  [12 bmp-16 bmp] 12 bmp Vt Set:  [500 mL-700 mL] 700 mL PEEP:  [5 cmH20] 5 cmH20 Plateau Pressure:  [17 cmH20-20 cmH20] 20 cmH20  INTAKE / OUTPUT: I/O last 3 completed shifts: In: 3834 [I.V.:3394; NG/GT:40; IV Piggyback:400] Out: 3955 [Urine:3955]  PHYSICAL EXAMINATION: General:  Young fit male in NAD on vent Neuro:  Sedated, paralyzed, RASS -4 HEENT:  [place]/AT, PERRL, no JVD Cardiovascular:  Tachy, regular, no MRG Lungs:  resps even non labored on vent, very little wheeze on end exp, much improved Abdomen:  Soft, non-tender, non-distended Musculoskeletal:  No  acute deformity or ROM limitation Skin:  Grossly intact  LABS:  BMET  Recent Labs Lab 01/13/16 1944 01/14/16 0530 01/15/16 0500  NA 143 143 145  K 5.0 4.8 4.9  CL 98* 98* 100*  CO2 41* 41* 38*  BUN 29* 28* 32*  CREATININE 1.11 1.10 1.10  GLUCOSE 139* 168* 158*    Electrolytes  Recent Labs Lab 01/12/16 0558 01/13/16 1411 01/13/16 1944 01/14/16 0530 01/15/16 0500  CALCIUM 8.1* 7.1* 7.6* 7.7* 8.2*  MG 2.4 2.8*  --  2.6* 2.7*  PHOS 2.2* 6.1*  --  2.2*  --     CBC  Recent Labs Lab 01/13/16 1450 01/14/16 0454 01/15/16 0500  WBC 10.7* 10.9* 10.4  HGB 15.0 11.6* 10.8*  HCT 48.6 37.4* 34.8*  PLT 193 170 179    Coag's No results for input(s): APTT, INR in the last 168 hours.  Sepsis Markers  Recent Labs Lab 01/11/16 0000  01/13/16 1450 01/14/16 0454 01/15/16 0500  LATICACIDVEN 1.3  --   --   --   --   PROCALCITON  --   < > 1.57 1.90 1.69  < > = values in this interval not displayed.  ABG  Recent Labs Lab 01/13/16 0605 01/13/16 1315 01/14/16 1344  PHART 7.080* 7.120* 7.397  PCO2ART ABOVE REPORTABLE RANGE ABOVE REPORTABLE RANGE 71.7*  PO2ART 93.1 103 53.3*    Liver Enzymes No  results for input(s): AST, ALT, ALKPHOS, BILITOT, ALBUMIN in the last 168 hours.  Cardiac Enzymes  Recent Labs Lab 01/10/16 1430 01/10/16 1957 01/11/16 0145  TROPONINI <0.03 <0.03 <0.03    Glucose  Recent Labs Lab 01/14/16 0409 01/14/16 0842 01/14/16 1257 01/14/16 1601 01/14/16 2025 01/14/16 2354  GLUCAP 142* 193* 145* 129* 137* 172*    Imaging No results found.   STUDIES:    CULTURES: MRSA (-) Trache asp 11/24 > (-) Blood 11/24 (-) Trach 11/26  > MSSA  ANTIBIOTICS: Azithro 11/24 > 11/26 Zosyn 11/26 >  vanco x 1 11/28  SIGNIFICANT EVENTS: 11/22 intubated for asthma 11/24 airtrapping  LINES/TUBES: ETT 11/23 >  DISCUSSION: 30 year old male smoker with asthma since childhood, poorly compliant r/t cost. Frequent visits/admissions.   Admitted 11/23 with status asthmaticus requiring intubation. Has required NMB, continues to have significant resp acidosis  ASSESSMENT / PLAN:  Acute hypercarbic respiratory failure secondary to status asthmaticus:  No focal opacification on CXR.  MSSA Bronchitis, at risk evolving PNA Severe Resp acidosis  Plan: Full vent support with PRVC, continue same vent settings with larger Vt, long exp time.  Continue empiric zosyn. D/c'd  azithro 11/26 (got 3 days) Heavy sedation  And plan stop paralytics 11/28 Scheduled duonebs and prn alb. Cont brovana and pulmicort qid (higher dose) Continue Solumedrol 60mg  IV q6h Needs to quit smoking ASAP Will need pulmonary follow up at discharge Echo still not yet done  Acute metabolic encephalopathy Plan: Propofol/fentanyl gtts  RASS goal -2 to -3 Stop paralytics 11/28  Hypotension -Resolved.  likely r/t high dose propofol required for vent synchrony. Note aggressive volume resuscitation  Plan:  Minimize air trapping Lighten sedation when he can tolerate May merit Diuresis at this point  Best practice VTE ppx: SCDs and SQ heparin Diet: NPO     Family: Updated his wife (separated) at bedside 11/27   Independent CC time 35 minutes  Levy Pupaobert Andreika Vandagriff, MD, PhD 01/15/2016, 10:04 AM [facility] [phone] or if no answer [phone]

## 2016-01-16 ENCOUNTER — Inpatient Hospital Stay (HOSPITAL_COMMUNITY): Payer: Self-pay

## 2016-01-16 DIAGNOSIS — R0600 Dyspnea, unspecified: Secondary | ICD-10-CM

## 2016-01-16 DIAGNOSIS — J9600 Acute respiratory failure, unspecified whether with hypoxia or hypercapnia: Secondary | ICD-10-CM

## 2016-01-16 DIAGNOSIS — R0603 Acute respiratory distress: Secondary | ICD-10-CM

## 2016-01-16 LAB — BASIC METABOLIC PANEL
ANION GAP: 7 (ref 5–15)
BUN: 30 mg/dL — AB (ref 6–20)
CHLORIDE: 103 mmol/L (ref 101–111)
CO2: 33 mmol/L — ABNORMAL HIGH (ref 22–32)
Calcium: 8.3 mg/dL — ABNORMAL LOW (ref 8.9–10.3)
Creatinine, Ser: 0.95 mg/dL (ref 0.61–1.24)
GFR calc Af Amer: >60 mL/min (ref >60)
GFR calc non Af Amer: >60 mL/min (ref >60)
GLUCOSE: 207 mg/dL — AB (ref 65–99)
POTASSIUM: 4.5 mmol/L (ref 3.5–5.1)
Sodium: 143 mmol/L (ref 135–145)

## 2016-01-16 LAB — TRIGLYCERIDES: TRIGLYCERIDES: 178 mg/dL — AB (ref <150)

## 2016-01-16 LAB — CBC
HEMATOCRIT: 36.5 % — AB (ref 39.0–52.0)
HEMOGLOBIN: 11.2 g/dL — AB (ref 13.0–17.0)
MCH: 29.3 pg (ref 26.0–34.0)
MCHC: 30.7 g/dL (ref 30.0–36.0)
MCV: 95.5 fL (ref 78.0–100.0)
Platelets: 211 10*3/uL (ref 150–400)
RBC: 3.82 MIL/uL — AB (ref 4.22–5.81)
RDW: 14.4 % (ref 11.5–15.5)
WBC: 15.9 10*3/uL — AB (ref 4.0–10.5)

## 2016-01-16 LAB — GLUCOSE, CAPILLARY
GLUCOSE-CAPILLARY: 203 mg/dL — AB (ref 65–99)
Glucose-Capillary: 218 mg/dL — ABNORMAL HIGH (ref 65–99)

## 2016-01-16 LAB — MAGNESIUM: Magnesium: 2.4 mg/dL (ref 1.7–2.4)

## 2016-01-16 LAB — ECHOCARDIOGRAM COMPLETE
Height: 73 in
WEIGHTICAEL: 3132.3 [oz_av]

## 2016-01-16 MED ORDER — SODIUM CHLORIDE 0.9 % IV SOLN
0.0000 mg/h | INTRAVENOUS | Status: DC
  Administered 2016-01-16 – 2016-01-17 (×2): 2 mg/h via INTRAVENOUS
  Filled 2016-01-16 (×3): qty 10

## 2016-01-16 MED ORDER — POLYETHYLENE GLYCOL 3350 17 G PO PACK
17.0000 g | PACK | Freq: Every day | ORAL | Status: DC
  Administered 2016-01-16 – 2016-01-29 (×4): 17 g via ORAL
  Filled 2016-01-16 (×7): qty 1

## 2016-01-16 MED ORDER — BUDESONIDE 0.5 MG/2ML IN SUSP
0.5000 mg | Freq: Four times a day (QID) | RESPIRATORY_TRACT | Status: DC
  Administered 2016-01-16 – 2016-01-22 (×22): 0.5 mg via RESPIRATORY_TRACT
  Filled 2016-01-16 (×22): qty 2

## 2016-01-16 MED ORDER — WHITE PETROLATUM GEL
Status: AC
  Administered 2016-01-16: 02:00:00
  Filled 2016-01-16: qty 1

## 2016-01-16 MED ORDER — FENTANYL CITRATE (PF) 100 MCG/2ML IJ SOLN
50.0000 ug | Freq: Once | INTRAMUSCULAR | Status: DC
Start: 2016-01-16 — End: 2016-01-23

## 2016-01-16 MED ORDER — FENTANYL BOLUS VIA INFUSION
50.0000 ug | INTRAVENOUS | Status: DC | PRN
  Filled 2016-01-16: qty 50

## 2016-01-16 MED ORDER — MORPHINE BOLUS VIA INFUSION
5.0000 mg | INTRAVENOUS | Status: DC | PRN
  Filled 2016-01-16: qty 20

## 2016-01-16 MED ORDER — PROPOFOL 1000 MG/100ML IV EMUL
5.0000 ug/kg/min | INTRAVENOUS | Status: DC
  Administered 2016-01-16: 60 ug/kg/min via INTRAVENOUS
  Administered 2016-01-17 (×2): 80 ug/kg/min via INTRAVENOUS
  Administered 2016-01-17: 65 ug/kg/min via INTRAVENOUS
  Administered 2016-01-17: 80 ug/kg/min via INTRAVENOUS
  Administered 2016-01-17: 70 ug/kg/min via INTRAVENOUS
  Administered 2016-01-17: 65 ug/kg/min via INTRAVENOUS
  Administered 2016-01-17: 80 ug/kg/min via INTRAVENOUS
  Administered 2016-01-17: 65 ug/kg/min via INTRAVENOUS
  Administered 2016-01-17: 75 ug/kg/min via INTRAVENOUS
  Administered 2016-01-18: 50 ug/kg/min via INTRAVENOUS
  Administered 2016-01-18: 65 ug/kg/min via INTRAVENOUS
  Administered 2016-01-18: 50 ug/kg/min via INTRAVENOUS
  Administered 2016-01-18: 65 ug/kg/min via INTRAVENOUS
  Administered 2016-01-18: 70 ug/kg/min via INTRAVENOUS
  Administered 2016-01-18: 50 ug/kg/min via INTRAVENOUS
  Administered 2016-01-18: 65 ug/kg/min via INTRAVENOUS
  Administered 2016-01-19: 20 ug/kg/min via INTRAVENOUS
  Administered 2016-01-19: 15 ug/kg/min via INTRAVENOUS
  Administered 2016-01-20: 20 ug/kg/min via INTRAVENOUS
  Administered 2016-01-20 (×2): 30 ug/kg/min via INTRAVENOUS
  Administered 2016-01-21: 50 ug/kg/min via INTRAVENOUS
  Administered 2016-01-21 (×3): 20 ug/kg/min via INTRAVENOUS
  Administered 2016-01-22: 40 ug/kg/min via INTRAVENOUS
  Administered 2016-01-22: 50 ug/kg/min via INTRAVENOUS
  Filled 2016-01-16 (×14): qty 100
  Filled 2016-01-16: qty 200
  Filled 2016-01-16 (×15): qty 100

## 2016-01-16 MED ORDER — FENTANYL 2500MCG IN NS 250ML (10MCG/ML) PREMIX INFUSION
25.0000 ug/h | INTRAVENOUS | Status: DC
  Administered 2016-01-16: 400 ug/h via INTRAVENOUS
  Administered 2016-01-17: 250 ug/h via INTRAVENOUS
  Administered 2016-01-17: 400 ug/h via INTRAVENOUS
  Administered 2016-01-17: 300 ug/h via INTRAVENOUS
  Administered 2016-01-18: 250 ug/h via INTRAVENOUS
  Administered 2016-01-19: 225 ug/h via INTRAVENOUS
  Administered 2016-01-20: 175 ug/h via INTRAVENOUS
  Administered 2016-01-21: 150 ug/h via INTRAVENOUS
  Administered 2016-01-22 – 2016-01-23 (×2): 100 ug/h via INTRAVENOUS
  Filled 2016-01-16 (×12): qty 250

## 2016-01-16 MED ORDER — SODIUM CHLORIDE 0.9 % IV SOLN
10.0000 mg/h | INTRAVENOUS | Status: DC
  Filled 2016-01-16: qty 5
  Filled 2016-01-16: qty 10

## 2016-01-16 NOTE — Progress Notes (Signed)
eLink Physician-Brief Progress Note Patient Name: Henry Hardin DOB: 12/15/1985 MRN: [id number]   Date of Service  01/16/2016  HPI/Events of Note  Respiratory distress with vent dyssynchrony. Responds well to versed but only briefly.   eICU Interventions  Will add versed infusion.      Intervention Category Intermediate Interventions: Respiratory distress - evaluation and management  Shane Crutchradeep Almeta Geisel 01/16/2016, 9:55 PM

## 2016-01-16 NOTE — Progress Notes (Addendum)
PULMONARY / CRITICAL CARE MEDICINE   Name: Henry Hardin MRN: [id number] DOB: 02/27/1985    ADMISSION DATE:  01/09/2016 CONSULTATION DATE:  01/10/2016  REFERRING MD:  Dr. Ethelda ChickJacubowitz  CHIEF COMPLAINT:  SOB  HISTORY OF PRESENT ILLNESS:   30 year old male with PMH asthma since childhood. He has never been consistent with taking his controller medications and has been unable to get albuterol due to insurance issues per mother. He has been smoking about a pack per day for the past 6 years during which time his symptoms have gotten worse. He has been seen in the ED 8 times in the past 6 months with exacerbations of asthma. He has been intubated once in the past. 11/22 he presented to the ED with CC respiratory distress for about 3 hours. He was treated with mag, albuterol, and atrovent by EMS. He was additionally treated with solumedrol, and additional bronchodilators in ED without improvement. His mental status began to worsen and he required intubation. PCCM asked to admit.   SUBJECTIVE:  Off paralysis Weaning rate 35   VITAL SIGNS: BP (!) 175/127 (BP Location: Left Arm)   Pulse (!) 132   Temp 99.9 F (37.7 C) (Axillary)   Resp (!) 30   Ht 6\' 1"  (1.854 m)   Wt 88.8 kg (195 lb 12.3 oz)   SpO2 94%   BMI 25.83 kg/m   HEMODYNAMICS:    VENTILATOR SETTINGS: Vent Mode: PSV;CPAP FiO2 (%):  [40 %-50 %] 40 % Set Rate:  [12 bmp] 12 bmp Vt Set:  [700 mL] 700 mL PEEP:  [5 cmH20] 5 cmH20 Pressure Support:  [10 cmH20-15 cmH20] 10 cmH20 Plateau Pressure:  [17 cmH20] 17 cmH20  INTAKE / OUTPUT: I/O last 3 completed shifts: In: 4079.9 [I.V.:2530.9; NG/GT:699; IV Piggyback:850] Out: 6820 [Urine:6820]  PHYSICAL EXAMINATION: General:  Young fit male, paradoxical waning Neuro:  Sedated, RASS -2 HEENT:  [place]/AT, PERRL, no JVD Cardiovascular:  Tachy, regular, no MRG Lungs: bronchospasm, wheezing rt, poor air entry Abdomen:  Soft, non-tender, non-distended Musculoskeletal:  No acute deformity  or ROM limitation Skin:  Grossly intact  LABS:  BMET  Recent Labs Lab 01/14/16 0530 01/15/16 0500 01/16/16 0530  NA 143 145 143  K 4.8 4.9 4.5  CL 98* 100* 103  CO2 41* 38* 33*  BUN 28* 32* 30*  CREATININE 1.10 1.10 0.95  GLUCOSE 168* 158* 207*    Electrolytes  Recent Labs Lab 01/12/16 0558 01/13/16 1411  01/14/16 0530 01/15/16 0500 01/16/16 0530  CALCIUM 8.1* 7.1*  < > 7.7* 8.2* 8.3*  MG 2.4 2.8*  --  2.6* 2.7* 2.4  PHOS 2.2* 6.1*  --  2.2*  --   --   < > = values in this interval not displayed.  CBC  Recent Labs Lab 01/14/16 0454 01/15/16 0500 01/16/16 0530  WBC 10.9* 10.4 15.9*  HGB 11.6* 10.8* 11.2*  HCT 37.4* 34.8* 36.5*  PLT 170 179 211    Coag's No results for input(s): APTT, INR in the last 168 hours.  Sepsis Markers  Recent Labs Lab 01/11/16 0000  01/13/16 1450 01/14/16 0454 01/15/16 0500  LATICACIDVEN 1.3  --   --   --   --   PROCALCITON  --   < > 1.57 1.90 1.69  < > = values in this interval not displayed.  ABG  Recent Labs Lab 01/13/16 1315 01/14/16 1344 01/15/16 1105  PHART 7.120* 7.397 7.396  PCO2ART ABOVE REPORTABLE RANGE 71.7* 64.4*  PO2ART 103  53.3* 84.8    Liver Enzymes No results for input(s): AST, ALT, ALKPHOS, BILITOT, ALBUMIN in the last 168 hours.  Cardiac Enzymes  Recent Labs Lab 01/10/16 1430 01/10/16 1957 01/11/16 0145  TROPONINI <0.03 <0.03 <0.03    Glucose  Recent Labs Lab 01/14/16 0409 01/14/16 0842 01/14/16 1257 01/14/16 1601 01/14/16 2025 01/14/16 2354  GLUCAP 142* 193* 145* 129* 137* 172*    Imaging Dg Chest Port 1 View  Result Date: 01/16/2016 CLINICAL DATA:  Acute respiratory failure. History of asthma, current smoker. EXAM: PORTABLE CHEST 1 VIEW COMPARISON:  Portable chest x-ray of January 13, 2016 FINDINGS: The patient is slightly rotated on today's study. The lungs are well-expanded. The interstitial markings on the left have become more conspicuous especially inferiorly. No  discrete alveolar infiltrate is observed. The cardiac silhouette is top-normal in size. The pulmonary vascularity is not clearly engorged. The endotracheal tube tip lies approximately 5.4 cm above the carina. The right-sided PICC line tip projects over the midportion of the SVC. The esophagogastric tube tip projects below the inferior margin of the image. IMPRESSION: Persistent mild hyperinflation. Left lower lobe subsegmental atelectasis. The support tubes are in reasonable position. Electronically Signed   By: David  SwazilandJordan M.D.   On: 01/16/2016 07:49     STUDIES:    CULTURES: MRSA (-) Trache asp 11/24 > (-) Blood 11/24 (-) Trach 11/26  > MSSA  ANTIBIOTICS: Azithro 11/24 > 11/26 Zosyn 11/26 >  vanco x 1 11/28  SIGNIFICANT EVENTS: 11/22 intubated for asthma 11/24 air trapping 11/28- paralysis off  LINES/TUBES: ETT 11/23 > picc rt   DISCUSSION: 30 year old male smoker with asthma since childhood, poorly compliant r/t cost. Frequent visits/admissions.  Admitted 11/23 with status asthmaticus requiring intubation. Has required NMB, continues to have significant resp acidosis  ASSESSMENT / PLAN:  Acute hypercarbic respiratory failure secondary to status asthmaticus:  No focal opacification on CXR.  MSSA Bronchitis, at risk evolving PNA Severe Resp acidosis  Plan: Was neg 1.9 liters, keep ancef Scheduled duonebs and prn alb. Cont brovana and pulmicort qid (higher dose) Continue Solumedrol 60mg  IV q6h, may need escalation, limit as able with mssa abg reviewed, keep same MV Keep rates low, no square wave rewuired Monitor autopeep Weaning ps 10, rate high pcxr in am for left  Acute metabolic encephalopathy Plan: Propofol/fentanyl gtts - wua RASS goal -2 Stop paralytics 11/28, done, may need intermittent, avoid as able with combination steroids  Hypotension -Resolved Plan:  Minimize air trapping Lighten sedation when he can tolerate He had auto diuresis, allow, no add  lasxi unless even balance noted  Best practice VTE ppx: SCDs and SQ heparin Diet: NPO  Feeding, and prop noted    Family: no family present  Ccm time 35 min   Mcarthur Rossettianiel J. Tyson AliasFeinstein, MD, FACP Pgr: [phone] [place]

## 2016-01-16 NOTE — Progress Notes (Signed)
Echocardiogram 2D Echocardiogram has been performed.  Marisue Humblelexis N Carmella Kees 01/16/2016, 11:49 AM

## 2016-01-17 ENCOUNTER — Inpatient Hospital Stay (HOSPITAL_COMMUNITY): Payer: Self-pay

## 2016-01-17 LAB — BASIC METABOLIC PANEL
Anion gap: 7 (ref 5–15)
BUN: 28 mg/dL — AB (ref 6–20)
CHLORIDE: 103 mmol/L (ref 101–111)
CO2: 33 mmol/L — ABNORMAL HIGH (ref 22–32)
CREATININE: 0.81 mg/dL (ref 0.61–1.24)
Calcium: 8.6 mg/dL — ABNORMAL LOW (ref 8.9–10.3)
GFR calc Af Amer: >60 mL/min (ref >60)
GFR calc non Af Amer: >60 mL/min (ref >60)
Glucose, Bld: 186 mg/dL — ABNORMAL HIGH (ref 65–99)
Potassium: 4.7 mmol/L (ref 3.5–5.1)
SODIUM: 143 mmol/L (ref 135–145)

## 2016-01-17 LAB — CBC
HCT: 39.1 % (ref 39.0–52.0)
HEMOGLOBIN: 12.3 g/dL — AB (ref 13.0–17.0)
MCH: 29.8 pg (ref 26.0–34.0)
MCHC: 31.5 g/dL (ref 30.0–36.0)
MCV: 94.7 fL (ref 78.0–100.0)
Platelets: 227 10*3/uL (ref 150–400)
RBC: 4.13 MIL/uL — ABNORMAL LOW (ref 4.22–5.81)
RDW: 13.9 % (ref 11.5–15.5)
WBC: 17.6 10*3/uL — ABNORMAL HIGH (ref 4.0–10.5)

## 2016-01-17 LAB — GLUCOSE, CAPILLARY
GLUCOSE-CAPILLARY: 183 mg/dL — AB (ref 65–99)
GLUCOSE-CAPILLARY: 234 mg/dL — AB (ref 65–99)
Glucose-Capillary: 213 mg/dL — ABNORMAL HIGH (ref 65–99)
Glucose-Capillary: 195 mg/dL — ABNORMAL HIGH (ref 65–99)
Glucose-Capillary: 196 mg/dL — ABNORMAL HIGH (ref 65–99)

## 2016-01-17 LAB — MAGNESIUM: MAGNESIUM: 2.3 mg/dL (ref 1.7–2.4)

## 2016-01-17 MED ORDER — INSULIN ASPART 100 UNIT/ML ~~LOC~~ SOLN
0.0000 [IU] | SUBCUTANEOUS | Status: DC
  Administered 2016-01-17 (×2): 3 [IU] via SUBCUTANEOUS
  Administered 2016-01-17 (×2): 5 [IU] via SUBCUTANEOUS
  Administered 2016-01-17: 3 [IU] via SUBCUTANEOUS
  Administered 2016-01-17 – 2016-01-18 (×2): 5 [IU] via SUBCUTANEOUS
  Administered 2016-01-18: 3 [IU] via SUBCUTANEOUS
  Administered 2016-01-18 (×2): 5 [IU] via SUBCUTANEOUS
  Administered 2016-01-18: 2 [IU] via SUBCUTANEOUS
  Administered 2016-01-18: 5 [IU] via SUBCUTANEOUS
  Administered 2016-01-19: 3 [IU] via SUBCUTANEOUS
  Administered 2016-01-19: 5 [IU] via SUBCUTANEOUS
  Administered 2016-01-19: 2 [IU] via SUBCUTANEOUS
  Administered 2016-01-19: 3 [IU] via SUBCUTANEOUS
  Administered 2016-01-19 (×2): 5 [IU] via SUBCUTANEOUS
  Administered 2016-01-20: 2 [IU] via SUBCUTANEOUS
  Administered 2016-01-20: 3 [IU] via SUBCUTANEOUS
  Administered 2016-01-20: 4 [IU] via SUBCUTANEOUS
  Administered 2016-01-20 (×2): 5 [IU] via SUBCUTANEOUS
  Administered 2016-01-20: 2 [IU] via SUBCUTANEOUS
  Administered 2016-01-20 – 2016-01-21 (×3): 5 [IU] via SUBCUTANEOUS
  Administered 2016-01-21 (×2): 3 [IU] via SUBCUTANEOUS
  Administered 2016-01-21: 5 [IU] via SUBCUTANEOUS
  Administered 2016-01-22 (×2): 3 [IU] via SUBCUTANEOUS
  Administered 2016-01-22 (×2): 2 [IU] via SUBCUTANEOUS
  Administered 2016-01-22: 5 [IU] via SUBCUTANEOUS
  Administered 2016-01-22: 2 [IU] via SUBCUTANEOUS
  Administered 2016-01-23: 3 [IU] via SUBCUTANEOUS
  Administered 2016-01-23: 5 [IU] via SUBCUTANEOUS
  Administered 2016-01-23 – 2016-01-24 (×4): 2 [IU] via SUBCUTANEOUS
  Administered 2016-01-24: 3 [IU] via SUBCUTANEOUS
  Administered 2016-01-24: 2 [IU] via SUBCUTANEOUS
  Administered 2016-01-24: 3 [IU] via SUBCUTANEOUS
  Administered 2016-01-25: 2 [IU] via SUBCUTANEOUS
  Administered 2016-01-25: 3 [IU] via SUBCUTANEOUS
  Administered 2016-01-25 – 2016-01-26 (×3): 2 [IU] via SUBCUTANEOUS
  Administered 2016-01-26: 3 [IU] via SUBCUTANEOUS
  Administered 2016-01-26: 2 [IU] via SUBCUTANEOUS
  Administered 2016-01-26 – 2016-01-27 (×4): 3 [IU] via SUBCUTANEOUS
  Administered 2016-01-27: 5 [IU] via SUBCUTANEOUS
  Administered 2016-01-27: 8 [IU] via SUBCUTANEOUS
  Administered 2016-01-27: 5 [IU] via SUBCUTANEOUS
  Administered 2016-01-27 (×2): 2 [IU] via SUBCUTANEOUS
  Administered 2016-01-28: 3 [IU] via SUBCUTANEOUS
  Administered 2016-01-28: 2 [IU] via SUBCUTANEOUS
  Administered 2016-01-28: 5 [IU] via SUBCUTANEOUS
  Administered 2016-01-28 – 2016-01-29 (×3): 3 [IU] via SUBCUTANEOUS

## 2016-01-17 MED ORDER — LORAZEPAM 2 MG/ML IJ SOLN
2.0000 mg | Freq: Three times a day (TID) | INTRAMUSCULAR | Status: DC
  Administered 2016-01-17 – 2016-01-23 (×19): 2 mg via INTRAVENOUS
  Filled 2016-01-17 (×19): qty 1

## 2016-01-17 MED ORDER — RISPERIDONE 1 MG/ML PO SOLN
1.0000 mg | Freq: Two times a day (BID) | ORAL | Status: DC
  Administered 2016-01-17 (×2): 1 mg
  Filled 2016-01-17 (×3): qty 1

## 2016-01-17 MED ORDER — METHYLPREDNISOLONE SODIUM SUCC 125 MG IJ SOLR
60.0000 mg | Freq: Three times a day (TID) | INTRAMUSCULAR | Status: DC
  Administered 2016-01-17 – 2016-01-18 (×3): 60 mg via INTRAVENOUS
  Filled 2016-01-17 (×3): qty 2

## 2016-01-17 NOTE — Progress Notes (Signed)
eLink Physician-Brief Progress Note Patient Name: Henry Hardin DOB: 01/19/1986 MRN: [id number]   Date of Service  01/17/2016  HPI/Events of Note  Hyperglycemia on TFs and steroids  eICU Interventions  Placed on moderate SSI q4 hours     Intervention Category Intermediate Interventions: Hyperglycemia - evaluation and treatment  [name] 01/17/2016, 12:03 AM

## 2016-01-17 NOTE — Progress Notes (Signed)
PULMONARY / CRITICAL CARE MEDICINE   Name: Henry Hardin MRN: [id number] DOB: 08/23/1985    ADMISSION DATE:  01/09/2016 CONSULTATION DATE:  01/10/2016  REFERRING MD:  Dr. Ethelda ChickJacubowitz  CHIEF COMPLAINT:  SOB  HISTORY OF PRESENT ILLNESS:   30 year old male with PMH asthma since childhood. He has never been consistent with taking his controller medications and has been unable to get albuterol due to insurance issues per mother. He has been smoking about a pack per day for the past 6 years during which time his symptoms have gotten worse. He has been seen in the ED 8 times in the past 6 months with exacerbations of asthma. He has been intubated once in the past. 11/22 he presented to the ED with CC respiratory distress for about 3 hours. He was treated with mag, albuterol, and atrovent by EMS. He was additionally treated with solumedrol, and additional bronchodilators in ED without improvement. His mental status began to worsen and he required intubation. PCCM asked to admit.   SUBJECTIVE:  Remained off paralysis Versed added Had BM Neg 1.4 liters  VITAL SIGNS: BP 134/73   Pulse 70   Temp 98 F (36.7 C) (Axillary)   Resp 12   Ht 6\' 1"  (1.854 m)   Wt 84.7 kg (186 lb 11.7 oz)   SpO2 94%   BMI 24.64 kg/m   HEMODYNAMICS:    VENTILATOR SETTINGS: Vent Mode: PRVC FiO2 (%):  [40 %] 40 % Set Rate:  [12 bmp] 12 bmp Vt Set:  [700 mL] 700 mL PEEP:  [5 cmH20] 5 cmH20 Plateau Pressure:  [14 cmH20-22 cmH20] 17 cmH20  INTAKE / OUTPUT: I/O last 3 completed shifts: In: 4702.1 [I.V.:2468.6; Other:120; WU/JW:1191.4G/GT:1463.5; IV Piggyback:650] Out: 6750 [Urine:6750]  PHYSICAL EXAMINATION: General:  Young fit male, paradoxical Neuro:  Sedated, RASS -3, not fc HEENT:  [place]/AT, PERRL, no JVD Cardiovascular:  s1s2 regular, no MRG Lungs: wheezing exp Abdomen:  Soft, non-tender, non-distended Musculoskeletal:  No acute deformity or ROM limitation Skin:  Grossly intact  LABS:  BMET  Recent  Labs Lab 01/15/16 0500 01/16/16 0530 01/17/16 0500  NA 145 143 143  K 4.9 4.5 4.7  CL 100* 103 103  CO2 38* 33* 33*  BUN 32* 30* 28*  CREATININE 1.10 0.95 0.81  GLUCOSE 158* 207* 186*    Electrolytes  Recent Labs Lab 01/12/16 0558 01/13/16 1411  01/14/16 0530 01/15/16 0500 01/16/16 0530 01/17/16 0500  CALCIUM 8.1* 7.1*  < > 7.7* 8.2* 8.3* 8.6*  MG 2.4 2.8*  --  2.6* 2.7* 2.4 2.3  PHOS 2.2* 6.1*  --  2.2*  --   --   --   < > = values in this interval not displayed.  CBC  Recent Labs Lab 01/15/16 0500 01/16/16 0530 01/17/16 0500  WBC 10.4 15.9* 17.6*  HGB 10.8* 11.2* 12.3*  HCT 34.8* 36.5* 39.1  PLT 179 211 227    Coag's No results for input(s): APTT, INR in the last 168 hours.  Sepsis Markers  Recent Labs Lab 01/11/16 0000  01/13/16 1450 01/14/16 0454 01/15/16 0500  LATICACIDVEN 1.3  --   --   --   --   PROCALCITON  --   < > 1.57 1.90 1.69  < > = values in this interval not displayed.  ABG  Recent Labs Lab 01/13/16 1315 01/14/16 1344 01/15/16 1105  PHART 7.120* 7.397 7.396  PCO2ART ABOVE REPORTABLE RANGE 71.7* 64.4*  PO2ART 103 53.3* 84.8    Liver  Enzymes No results for input(s): AST, ALT, ALKPHOS, BILITOT, ALBUMIN in the last 168 hours.  Cardiac Enzymes  Recent Labs Lab 01/10/16 1430 01/10/16 1957 01/11/16 0145  TROPONINI <0.03 <0.03 <0.03    Glucose  Recent Labs Lab 01/14/16 1601 01/14/16 2025 01/14/16 2354 01/16/16 1953 01/16/16 2356 01/17/16 0339  GLUCAP 129* 137* 172* 203* 218* 183*    Imaging Dg Chest Port 1 View  Result Date: 01/17/2016 CLINICAL DATA:  Hypoxia EXAM: PORTABLE CHEST 1 VIEW COMPARISON:  January 16, 2016 FINDINGS: Endotracheal tube tip is 5.2 cm above the carina. Central catheter tip is in the superior vena cava. Nasogastric tube tip and side port below the diaphragm. No pneumothorax. There is no edema or consolidation. Heart is upper normal in size with pulmonary vascularity within normal limits.  No adenopathy. No bone lesions. IMPRESSION: Tube and catheter positions as described without pneumothorax. No edema or consolidation. Electronically Signed   By: Bretta BangWilliam  Woodruff III M.D.   On: 01/17/2016 07:32     STUDIES:    CULTURES: MRSA (-) Trache asp 11/24 > (-) Blood 11/24 (-) Trach 11/26  > MSSA  ANTIBIOTICS: Azithro 11/24 > 11/26 Zosyn 11/26 >  vanco x 1 11/28 Ancef 11/28>>>  SIGNIFICANT EVENTS: 11/22 intubated for asthma 11/24 air trapping 11/28- paralysis off  LINES/TUBES: ETT 11/23 > picc rt   DISCUSSION: 30 year old male smoker with asthma since childhood, poorly compliant r/t cost. Frequent visits/admissions.  Admitted 11/23 with status asthmaticus requiring intubation. Has required NMB, continues to have significant resp acidosis  ASSESSMENT / PLAN:  Acute hypercarbic respiratory failure secondary to status asthmaticus:  No focal opacification on CXR.  MSSA Bronchitis, at risk evolving PNA Severe Resp acidosis  Plan: Was neg again and left lung continues to clear Allow continued neg balance on own No new abg needed, on low MV, no autopeep noted WUA and wean cpap 5 ps5, goal 2 hours, slight reduction steroids Bders  Acute metabolic encephalopathy Plan: Propofol/fentanyl gtts and versed added 11/29 RASS goal -2 Consider add ativan q12h and Risperdal in hopes to reduce drips  Hypotension -Resolved Plan:  Tele Allow neg balance on own No role lasix  MSSA PNA Ancef, will need totoal duration abx 8-10 days  constipation mir had BM, maintain TF pepcid  Best practice VTE ppx: SCDs and SQ heparin Diet: NPO  Feeding, and prop noted    Family: no family present  Ccm time 35 min   Mcarthur Rossettianiel J. Tyson AliasFeinstein, MD, FACP Pgr: [phone] [place] [facility]

## 2016-01-18 LAB — CBC
HEMATOCRIT: 38.1 % — AB (ref 39.0–52.0)
HEMOGLOBIN: 12.4 g/dL — AB (ref 13.0–17.0)
MCH: 30.6 pg (ref 26.0–34.0)
MCHC: 32.5 g/dL (ref 30.0–36.0)
MCV: 94.1 fL (ref 78.0–100.0)
PLATELETS: 199 10*3/uL (ref 150–400)
RBC: 4.05 MIL/uL — ABNORMAL LOW (ref 4.22–5.81)
RDW: 13.9 % (ref 11.5–15.5)
WBC: 17.5 10*3/uL — AB (ref 4.0–10.5)

## 2016-01-18 LAB — MAGNESIUM: Magnesium: 2.2 mg/dL (ref 1.7–2.4)

## 2016-01-18 LAB — BASIC METABOLIC PANEL
ANION GAP: 7 (ref 5–15)
BUN: 25 mg/dL — AB (ref 6–20)
CHLORIDE: 97 mmol/L — AB (ref 101–111)
CO2: 33 mmol/L — AB (ref 22–32)
Calcium: 8 mg/dL — ABNORMAL LOW (ref 8.9–10.3)
Creatinine, Ser: 0.77 mg/dL (ref 0.61–1.24)
GFR calc Af Amer: >60 mL/min (ref >60)
GFR calc non Af Amer: >60 mL/min (ref >60)
GLUCOSE: 199 mg/dL — AB (ref 65–99)
POTASSIUM: 4.7 mmol/L (ref 3.5–5.1)
Sodium: 137 mmol/L (ref 135–145)

## 2016-01-18 LAB — GLUCOSE, CAPILLARY
GLUCOSE-CAPILLARY: 212 mg/dL — AB (ref 65–99)
GLUCOSE-CAPILLARY: 147 mg/dL — AB (ref 65–99)
GLUCOSE-CAPILLARY: 187 mg/dL — AB (ref 65–99)
Glucose-Capillary: 203 mg/dL — ABNORMAL HIGH (ref 65–99)
Glucose-Capillary: 204 mg/dL — ABNORMAL HIGH (ref 65–99)
Glucose-Capillary: 218 mg/dL — ABNORMAL HIGH (ref 65–99)
Glucose-Capillary: 227 mg/dL — ABNORMAL HIGH (ref 65–99)
Glucose-Capillary: 249 mg/dL — ABNORMAL HIGH (ref 65–99)

## 2016-01-18 MED ORDER — FAMOTIDINE 40 MG/5ML PO SUSR
20.0000 mg | Freq: Two times a day (BID) | ORAL | Status: DC
  Administered 2016-01-18 – 2016-01-24 (×9): 20 mg
  Filled 2016-01-18 (×14): qty 2.5

## 2016-01-18 MED ORDER — RISPERIDONE 2 MG PO TABS
2.0000 mg | ORAL_TABLET | Freq: Two times a day (BID) | ORAL | Status: DC
  Administered 2016-01-18 – 2016-01-24 (×10): 2 mg
  Filled 2016-01-18 (×13): qty 1

## 2016-01-18 MED ORDER — METHYLPREDNISOLONE SODIUM SUCC 125 MG IJ SOLR
60.0000 mg | Freq: Two times a day (BID) | INTRAMUSCULAR | Status: DC
  Administered 2016-01-18 – 2016-01-23 (×10): 60 mg via INTRAVENOUS
  Filled 2016-01-18 (×10): qty 2

## 2016-01-18 MED ORDER — RISPERIDONE 1 MG PO TABS
1.0000 mg | ORAL_TABLET | Freq: Two times a day (BID) | ORAL | Status: DC
  Filled 2016-01-18: qty 1

## 2016-01-18 NOTE — Progress Notes (Signed)
PULMONARY / CRITICAL CARE MEDICINE   Name: Henry Hardin MRN: [id number] DOB: 01/04/1986    ADMISSION DATE:  01/09/2016 CONSULTATION DATE:  01/10/2016  REFERRING MD:  Dr. Ethelda ChickJacubowitz  CHIEF COMPLAINT:  SOB  HISTORY OF PRESENT ILLNESS:   [age] year old male with PMH asthma since childhood. He has never been consistent with taking his controller medications and has been unable to get albuterol due to insurance issues per mother. He has been smoking about a pack per day for the past 6 years during which time his symptoms have gotten worse. He has been seen in the ED 8 times in the past 6 months with exacerbations of asthma. He has been intubated once in the past. 11/22 he presented to the ED with CC respiratory distress for about 3 hours. He was treated with mag, albuterol, and atrovent by EMS. He was additionally treated with solumedrol, and additional bronchodilators in ED without improvement. His mental status began to worsen and he required intubation. PCCM asked to admit.   SUBJECTIVE:  RN reports pt's sedation lightened, noted "double stacking" on vent.  Sedation restarted.  Net neg 1.3 L in last 24 hours  VITAL SIGNS: BP 135/75   Pulse 70   Temp 98.8 F (37.1 C) (Oral)   Resp 12   Ht 6\' 1"  (1.854 m)   Wt 164 lb 0.4 oz (74.4 kg)   SpO2 96%   BMI 21.64 kg/m   HEMODYNAMICS:    VENTILATOR SETTINGS: Vent Mode: PRVC FiO2 (%):  [40 %] 40 % Set Rate:  [12 bmp] 12 bmp Vt Set:  [700 mL] 700 mL PEEP:  [5 cmH20] 5 cmH20 Plateau Pressure:  [16 cmH20-21 cmH20] 21 cmH20  INTAKE / OUTPUT: I/O last 3 completed shifts: In: 4571.6 [I.V.:2476.6; Other:120; NG/GT:1575; IV Piggyback:400] Out: 6340 [Urine:6340]  PHYSICAL EXAMINATION: General:  Young adult male, paradoxical Neuro:  Sedated, RASS -2 HEENT:  [place]/AT, PERRL, no JVD Cardiovascular:  s1s2 regular, no MRG Lungs: non-labored, breath stacking on vent, soft wheeze bilaterally Abdomen:  Soft, non-tender,  non-distended Musculoskeletal:  No acute deformity or ROM limitation Skin:  Grossly intact  LABS:  BMET  Recent Labs Lab 01/16/16 0530 01/17/16 0500 01/18/16 0427  NA 143 143 137  K 4.5 4.7 4.7  CL 103 103 97*  CO2 33* 33* 33*  BUN 30* 28* 25*  CREATININE 0.95 0.81 0.77  GLUCOSE 207* 186* 199*    Electrolytes  Recent Labs Lab 01/12/16 0558 01/13/16 1411  01/14/16 0530  01/16/16 0530 01/17/16 0500 01/18/16 0427  CALCIUM 8.1* 7.1*  < > 7.7*  < > 8.3* 8.6* 8.0*  MG 2.4 2.8*  --  2.6*  < > 2.4 2.3 2.2  PHOS 2.2* 6.1*  --  2.2*  --   --   --   --   < > = values in this interval not displayed.  CBC  Recent Labs Lab 01/16/16 0530 01/17/16 0500 01/18/16 0427  WBC 15.9* 17.6* 17.5*  HGB 11.2* 12.3* 12.4*  HCT 36.5* 39.1 38.1*  PLT 211 227 199    Coag's No results for input(s): APTT, INR in the last 168 hours.  Sepsis Markers  Recent Labs Lab 01/13/16 1450 01/14/16 0454 01/15/16 0500  PROCALCITON 1.57 1.90 1.69    ABG  Recent Labs Lab 01/13/16 1315 01/14/16 1344 01/15/16 1105  PHART 7.120* 7.397 7.396  PCO2ART ABOVE REPORTABLE RANGE 71.7* 64.4*  PO2ART 103 53.3* 84.8    Liver Enzymes No results for input(s): AST,  ALT, ALKPHOS, BILITOT, ALBUMIN in the last 168 hours.  Cardiac Enzymes No results for input(s): TROPONINI, PROBNP in the last 168 hours.  Glucose  Recent Labs Lab 01/17/16 1202 01/17/16 1610 01/17/16 2015 01/18/16 0000 01/18/16 0336 01/18/16 0711  GLUCAP 195* 234* 196* 227* 212* 147*    Imaging No results found.   STUDIES:    CULTURES: MRSA (-) Trache asp 11/24 > (-) Blood 11/24 (-) Trach 11/26  > MSSA  ANTIBIOTICS: Azithro 11/24 > 11/26 Zosyn 11/26 >> 11/28 vanco x 1 11/28 Ancef 11/28 >>  SIGNIFICANT EVENTS: 11/22  intubated for asthma 11/24  air trapping 11/28  paralysis off  LINES/TUBES: ETT 11/23 >> RUE PICC 11/26 >>  DISCUSSION: 30 year old male smoker with asthma since childhood, poorly  compliant r/t cost. Frequent visits/admissions.  Admitted 11/23 with status asthmaticus requiring intubation. Has required NMB, continues to have significant resp acidosis  ASSESSMENT / PLAN:  Acute hypercarbic respiratory failure secondary to status asthmaticus:  No focal opacification on CXR.  MSSA Bronchitis, at risk evolving PNA Severe Resp acidosis  Plan: Goal for negative balance Monitor for autopeep WUA and wean cpap 5 ps5, goal 2 hours Solu-medrol 60 mg IV Q8 Brovana + Pulmicort  Intermittent CXR  Acute metabolic encephalopathy At Risk Deconditioning   Plan: Propofol/fentanyl gtts and versed added 11/29 Wean versed to off first if able RASS goal -2 Ativan 2mg  IV Q8 + Risperdal in hopes to reduce drips Passive ROM   Hypotension - resolved  Plan:  Tele Allow neg balance on own No role lasix  MSSA PNA Ancef, will need totoal duration abx 8-10 days  Constipation At Risk Malnutrition  Continue Miralax  TF Pepcid  Best practice VTE ppx: SCDs and SQ heparin Diet: NPO, TF for nutrition    Family:  Mother / Father updated on patients status.    CC Time:  30 minutes   Canary BrimBrandi Ollis, NP-C [facility] Pgr: [phone] or if no answer [phone] 01/18/2016, 8:54 AM   STAFF NOTE: I, Rory Percyaniel Francene Mcerlean, MD FACP have personally reviewed patient's available data, including medical history, events of note, physical examination and test results as part of my evaluation. I have discussed with resident/NP and other care providers such as pharmacist, RN and RRT. In addition, I personally evaluated patient and elicited key findings of: awakens, agitation, lungs moving better air, no pcxr, no autopeep, WUA then cpap 5 ps 5, goal, but need reduction in sedation further, appears that neuro is the limiting issue for weaning, escalate ativan, escalate risp, then re attempt dc versed, also could add prn versed, I updated family in room myself, reduce steroids,  maintain Bders, after above increases in int sedation would then reduice fent , oncce versed off, continued feeds The patient is critically ill with multiple organ systems failure and requires high complexity decision making for assessment and support, frequent evaluation and titration of therapies, application of advanced monitoring technologies and extensive interpretation of multiple databases.   Critical Care Time devoted to patient care services described in this note is 35 Minutes. This time reflects time of care of this signee: Rory Percyaniel Medhansh Brinkmeier, MD FACP. This critical care time does not reflect procedure time, or teaching time or supervisory time of PA/NP/Med student/Med Resident etc but could involve care discussion time. Rest per NP/medical resident whose note is outlined above and that I agree with   Mcarthur Rossettianiel J. Tyson AliasFeinstein, MD, FACP Pgr: [phone] [facility] 01/18/2016 9:46 AM

## 2016-01-18 NOTE — Progress Notes (Signed)
Pt vent continues to alarm "low peep" due to trigger being set at -5. RT adjusted trigger to -2 with peep increasing to 5. Pt VT >700, Pt not stacking breaths, more comfortable with adjustments. RT paged CCM MD, waiting call back. RN made aware, RT will continue to monitor.

## 2016-01-19 ENCOUNTER — Inpatient Hospital Stay (HOSPITAL_COMMUNITY): Payer: Self-pay

## 2016-01-19 DIAGNOSIS — J9811 Atelectasis: Secondary | ICD-10-CM

## 2016-01-19 LAB — BLOOD GAS, ARTERIAL
Acid-Base Excess: 9.7 mmol/L — ABNORMAL HIGH (ref 0.0–2.0)
BICARBONATE: 33.8 mmol/L — AB (ref 20.0–28.0)
FIO2: 40
O2 Saturation: 91.6 %
PATIENT TEMPERATURE: 98.6
PH ART: 7.481 — AB (ref 7.350–7.450)
PO2 ART: 61.9 mmHg — AB (ref 83.0–108.0)
pCO2 arterial: 45.8 mmHg (ref 32.0–48.0)

## 2016-01-19 LAB — BASIC METABOLIC PANEL
ANION GAP: 9 (ref 5–15)
BUN: 30 mg/dL — ABNORMAL HIGH (ref 6–20)
CALCIUM: 8.3 mg/dL — AB (ref 8.9–10.3)
CHLORIDE: 97 mmol/L — AB (ref 101–111)
CO2: 32 mmol/L (ref 22–32)
Creatinine, Ser: 0.87 mg/dL (ref 0.61–1.24)
GFR calc non Af Amer: >60 mL/min (ref >60)
GLUCOSE: 188 mg/dL — AB (ref 65–99)
POTASSIUM: 4.1 mmol/L (ref 3.5–5.1)
Sodium: 138 mmol/L (ref 135–145)

## 2016-01-19 LAB — CBC
HEMATOCRIT: 38.8 % — AB (ref 39.0–52.0)
HEMOGLOBIN: 12.6 g/dL — AB (ref 13.0–17.0)
MCH: 29.9 pg (ref 26.0–34.0)
MCHC: 32.5 g/dL (ref 30.0–36.0)
MCV: 92.2 fL (ref 78.0–100.0)
Platelets: 199 10*3/uL (ref 150–400)
RBC: 4.21 MIL/uL — AB (ref 4.22–5.81)
RDW: 13.4 % (ref 11.5–15.5)
WBC: 20 10*3/uL — ABNORMAL HIGH (ref 4.0–10.5)

## 2016-01-19 LAB — GLUCOSE, CAPILLARY
GLUCOSE-CAPILLARY: 184 mg/dL — AB (ref 65–99)
GLUCOSE-CAPILLARY: 224 mg/dL — AB (ref 65–99)
GLUCOSE-CAPILLARY: 126 mg/dL — AB (ref 65–99)
Glucose-Capillary: 234 mg/dL — ABNORMAL HIGH (ref 65–99)
Glucose-Capillary: 186 mg/dL — ABNORMAL HIGH (ref 65–99)

## 2016-01-19 LAB — TRIGLYCERIDES: Triglycerides: 218 mg/dL — ABNORMAL HIGH (ref <150)

## 2016-01-19 LAB — MAGNESIUM: MAGNESIUM: 2 mg/dL (ref 1.7–2.4)

## 2016-01-19 MED ORDER — FENTANYL CITRATE (PF) 100 MCG/2ML IJ SOLN
100.0000 ug | Freq: Once | INTRAMUSCULAR | Status: AC
  Administered 2016-01-19: 100 ug via INTRAVENOUS

## 2016-01-19 MED ORDER — ROCURONIUM BROMIDE 50 MG/5ML IV SOLN
70.0000 mg | Freq: Once | INTRAVENOUS | Status: AC
  Administered 2016-01-19: 70 mg via INTRAVENOUS
  Filled 2016-01-19: qty 7

## 2016-01-19 MED ORDER — FUROSEMIDE 10 MG/ML IJ SOLN
20.0000 mg | Freq: Once | INTRAMUSCULAR | Status: AC
  Administered 2016-01-19: 20 mg via INTRAVENOUS
  Filled 2016-01-19: qty 2

## 2016-01-19 MED ORDER — MIDAZOLAM HCL 2 MG/2ML IJ SOLN
2.0000 mg | Freq: Once | INTRAMUSCULAR | Status: AC
  Administered 2016-01-19: 2 mg via INTRAVENOUS

## 2016-01-19 MED ORDER — ETOMIDATE 2 MG/ML IV SOLN
20.0000 mg | Freq: Once | INTRAVENOUS | Status: AC
  Administered 2016-01-19: 20 mg via INTRAVENOUS

## 2016-01-19 NOTE — Procedures (Signed)
Intubation Procedure Note Henry Hardin [id number] 07/29/1985  Procedure: Intubation Indications: Respiratory insufficiency  Procedure Details Consent: Unable to obtain consent because of emergent medical necessity. Time Out: Verified patient identification, verified procedure, site/side was marked, verified correct patient position, special equipment/implants available, medications/allergies/relevent history reviewed, required imaging and test results available.  Performed  MAC and 3 Medications:  Fentanyl 100 mcg Etomidate 20 mg Versed 2 mg NMB 70 mg rocuronium    Evaluation Hemodynamic Status: BP stable throughout; O2 sats: stable throughout Patient's Current Condition: stable Complications: No apparent complications Patient did tolerate procedure well. Chest X-ray ordered to verify placement.  CXR: pending.   Brett CanalesSteve Minor ACNP [facility] Pager [phone] till 3 pm If no answer page [phone] 01/19/2016, 12:58 PM  Alyson ReedyWesam G. Beulah Matusek, M.D. [facility]. Pager: [phone]. After hours pager: [phone].

## 2016-01-19 NOTE — Progress Notes (Signed)
Dr Molli KnockYacoub notified of radiology report from chest xray concerning air in LLL and around the heart boarder. No new orders recieved

## 2016-01-19 NOTE — Progress Notes (Signed)
PULMONARY / CRITICAL CARE MEDICINE   Name: Henry Hardin MRN: [id number] DOB: 02/16/1986    ADMISSION DATE:  01/09/2016 CONSULTATION DATE:  01/10/2016  REFERRING MD:  Henry. Ethelda ChickJacubowitz  CHIEF COMPLAINT:  SOB  HISTORY OF PRESENT ILLNESS:   30 year old male with PMH asthma since childhood. He has never been consistent with taking his controller medications and has been unable to get albuterol due to insurance issues per mother. He has been smoking about a pack per day for the past 6 years during which time his symptoms have gotten worse. He has been seen in the ED 8 times in the past 6 months with exacerbations of asthma. He has been intubated once in the past. 11/22 he presented to the ED with CC respiratory distress for about 3 hours. He was treated with mag, albuterol, and atrovent by EMS. He was additionally treated with solumedrol, and additional bronchodilators in ED without improvement. His mental status began to worsen and he required intubation. PCCM asked to admit.   SUBJECTIVE:  Likes psv much better than prvc  VITAL SIGNS: BP 127/75   Pulse (!) 102   Temp 98.8 F (37.1 C) (Axillary)   Resp 17   Ht 6\' 1"  (1.854 m)   Wt 179 lb 0.2 oz (81.2 kg)   SpO2 98%   BMI 23.62 kg/m   HEMODYNAMICS:    VENTILATOR SETTINGS: Vent Mode: CPAP;PSV FiO2 (%):  [40 %] 40 % Set Rate:  [12 bmp] 12 bmp Vt Set:  [700 mL] 700 mL PEEP:  [5 cmH20] 5 cmH20 Pressure Support:  [10 cmH20] 10 cmH20 Plateau Pressure:  [12 cmH20-26 cmH20] 20 cmH20  INTAKE / OUTPUT: I/O last 3 completed shifts: In: 2672.1 [I.V.:1307.1; Other:130; NG/GT:1035; IV Piggyback:200] Out: 4875 [Urine:4875]  PHYSICAL EXAMINATION: General:  Young adult male, paradoxical Neuro:  Sedated, RASS -2 HEENT:  [place]/AT, PERRL, no JVD Cardiovascular:  s1s2 regular, no MRG Lungs: non-labored, breath stacking on vent, soft wheeze bilaterally Abdomen:  Soft, non-tender, non-distended Musculoskeletal:  No acute deformity or ROM  limitation Skin:  Grossly intact  LABS:  BMET  Recent Labs Lab 01/17/16 0500 01/18/16 0427 01/19/16 0330  NA 143 137 138  K 4.7 4.7 4.1  CL 103 97* 97*  CO2 33* 33* 32  BUN 28* 25* 30*  CREATININE 0.81 0.77 0.87  GLUCOSE 186* 199* 188*    Electrolytes  Recent Labs Lab 01/13/16 1411  01/14/16 0530  01/17/16 0500 01/18/16 0427 01/19/16 0330  CALCIUM 7.1*  < > 7.7*  < > 8.6* 8.0* 8.3*  MG 2.8*  --  2.6*  < > 2.3 2.2 2.0  PHOS 6.1*  --  2.2*  --   --   --   --   < > = values in this interval not displayed.  CBC  Recent Labs Lab 01/17/16 0500 01/18/16 0427 01/19/16 0330  WBC 17.6* 17.5* 20.0*  HGB 12.3* 12.4* 12.6*  HCT 39.1 38.1* 38.8*  PLT 227 199 199    Coag's No results for input(s): APTT, INR in the last 168 hours.  Sepsis Markers  Recent Labs Lab 01/13/16 1450 01/14/16 0454 01/15/16 0500  PROCALCITON 1.57 1.90 1.69    ABG  Recent Labs Lab 01/13/16 1315 01/14/16 1344 01/15/16 1105  PHART 7.120* 7.397 7.396  PCO2ART ABOVE REPORTABLE RANGE 71.7* 64.4*  PO2ART 103 53.3* 84.8    Liver Enzymes No results for input(s): AST, ALT, ALKPHOS, BILITOT, ALBUMIN in the last 168 hours.  Cardiac Enzymes No  results for input(s): TROPONINI, PROBNP in the last 168 hours.  Glucose  Recent Labs Lab 01/18/16 1113 01/18/16 1417 01/18/16 1556 01/18/16 1923 01/18/16 2303 01/19/16 0307  GLUCAP 249* 203* 187* 204* 218* 184*    Imaging No results found.   STUDIES:    CULTURES: MRSA (-) Trache asp 11/24 > (-) Blood 11/24 (-) Trach 11/26  > MSSA  ANTIBIOTICS: Azithro 11/24 > 11/26 Zosyn 11/26 >> 11/28 vanco x 1 11/28 Ancef 11/28 >>  SIGNIFICANT EVENTS: 11/22  intubated for asthma 11/24  air trapping 11/28  paralysis off  LINES/TUBES: ETT 11/23 >> RUE PICC 11/26 >>  DISCUSSION: 30 year old male smoker with asthma since childhood, poorly compliant r/t cost. Frequent visits/admissions.  Admitted 11/23 with status asthmaticus  requiring intubation. Has required NMB, continues to have significant resp acidosis  ASSESSMENT / PLAN:  Acute hypercarbic respiratory failure secondary to status asthmaticus:  No focal opacification on CXR.  MSSA Bronchitis, at risk evolving PNA Severe Resp acidosis  Plan: Goal for negative balance Monitor for autopeep WUA and wean cpap 5 ps5, goal 2 hours Solu-medrol 60 mg IV Q8 Brovana + Pulmicort  Intermittent CXR He needs an attempt at extubation but concern for CIP with prolonged intubation he may require trach.  Acute metabolic encephalopathy At Risk Deconditioning   Plan: Propofol/fentanyl gtts and versed added 11/29 Wean versed to off first if able RASS goal -2 Ativan 2mg  IV Q8 + Risperdal in hopes to reduce drips Passive ROM   Hypotension - resolved  Plan:  Tele Allow neg balance on own No role lasix  MSSA PNA Ancef #4/10, will need totoal duration abx 8-10 days  Constipation At Risk Malnutrition  Continue Miralax  TF Pepcid  Best practice VTE ppx: SCDs and SQ heparin Diet: NPO, TF for nutrition    Family:  No family at bedside  CC Time:  30 minutes   Henry Hardin ACNP Henry Hardin PCCM Pager [phone] till 3 pm If no answer page [phone] 01/19/2016, 7:44 AM  Attending Note:  30 year old male smoker with asthma presenting with status asthmaticus requiring intubation 10 days ago.  On WUA he did not fully arouse.  On exam, lung sounds with no wheezing.  I reviewed CXR myself, ETT ok but LLL is atelectatic.  Will stop sedation completely and evaluate mental status.  Would like to at least give the patient a chance at extubation prior to committing to a tracheostomy given his age and over all functional status.  Will plan on a bronchoscopy today if not extubatable.  Low dose diureses with 20 mg of IV lasix x1.  PS as able.  Switch to PCV if will go back to full support.  The patient is critically ill with multiple organ systems failure and requires high  complexity decision making for assessment and support, frequent evaluation and titration of therapies, application of advanced monitoring technologies and extensive interpretation of multiple databases.   Critical Care Time devoted to patient care services described in this note is  35  Minutes. This time reflects time of care of this signee Henry Hardin. This critical care time does not reflect procedure time, or teaching time or supervisory time of PA/NP/Med student/Med Resident etc but could involve care discussion time.  Henry ReedyWesam G. Hardin, M.D. [facility]. Pager: [phone]. After hours pager: [phone].

## 2016-01-19 NOTE — Procedures (Signed)
Extubation Procedure Note  Patient Details:   Name: Henry Hardin DOB: 11/26/1985 MRN: [id number]   Airway Documentation:     Evaluation  O2 sats: stable throughout Complications: No apparent complications Patient did tolerate procedure well. Bilateral Breath Sounds: Expiratory wheezes   Yes   Patient extubated to 4lnc. Vital signs stable at this time. No complications. RN at bedside. RT will continue to monitor.  Ave Filterdkins, Becket Wecker Williams 01/19/2016, 12:38 PM

## 2016-01-19 NOTE — Procedures (Signed)
Intubation Procedure Note Henry Hardin [id number] 05/03/1985  Procedure: Intubation Indications: Airway protection and maintenance  Procedure Details Consent: Risks of procedure as well as the alternatives and risks of each were explained to the (patient/caregiver).  Consent for procedure obtained. Time Out: Verified patient identification, verified procedure, site/side was marked, verified correct patient position, special equipment/implants available, medications/allergies/relevent history reviewed, required imaging and test results available.  Performed  Maximum sterile technique was used including gloves, hand hygiene and mask.  MAC and 4    Evaluation Hemodynamic Status: BP stable throughout; O2 sats: stable throughout Patient's Current Condition: stable Complications: No apparent complications Patient did tolerate procedure well. Chest X-ray ordered to verify placement.  CXR: tube position acceptable.   Ave Filterdkins, Binnie Droessler Williams 01/19/2016

## 2016-01-19 NOTE — Procedures (Signed)
Bronchoscopy Procedure Note Enzo BiReegius L Housman [id number] 07/23/1985  Procedure: Bronchoscopy Indications: Diagnostic evaluation of the airways and Obtain specimens for culture and/or other diagnostic studies  Procedure Details Consent: Unable to obtain consent because of emergent medical necessity. Time Out: Verified patient identification, verified procedure, site/side was marked, verified correct patient position, special equipment/implants available, medications/allergies/relevent history reviewed, required imaging and test results available.  Performed  In preparation for procedure, patient was given 100% FiO2 and bronchoscope lubricated. Sedation: Benzodiazepines, Muscle relaxants and Etomidate  Airway entered and the following bronchi were examined: RUL, RML, RLL, LUL, LLL and Bronchi.   LLL was completely occluded and aspirated. Bronchoscope removed.    Evaluation Hemodynamic Status: BP stable throughout; O2 sats: stable throughout Patient's Current Condition: stable Specimens:  Sent purulent fluid Complications: No apparent complications Patient did tolerate procedure well.   Koren BoundYACOUB,WESAM 01/19/2016

## 2016-01-20 ENCOUNTER — Inpatient Hospital Stay (HOSPITAL_COMMUNITY): Payer: Self-pay

## 2016-01-20 LAB — BASIC METABOLIC PANEL
ANION GAP: 9 (ref 5–15)
BUN: 34 mg/dL — ABNORMAL HIGH (ref 6–20)
CALCIUM: 8.4 mg/dL — AB (ref 8.9–10.3)
CO2: 31 mmol/L (ref 22–32)
CREATININE: 0.84 mg/dL (ref 0.61–1.24)
Chloride: 98 mmol/L — ABNORMAL LOW (ref 101–111)
Glucose, Bld: 183 mg/dL — ABNORMAL HIGH (ref 65–99)
Potassium: 3.8 mmol/L (ref 3.5–5.1)
Sodium: 138 mmol/L (ref 135–145)

## 2016-01-20 LAB — CBC
HCT: 38.6 % — ABNORMAL LOW (ref 39.0–52.0)
HEMOGLOBIN: 12.7 g/dL — AB (ref 13.0–17.0)
MCH: 29.8 pg (ref 26.0–34.0)
MCHC: 32.9 g/dL (ref 30.0–36.0)
MCV: 90.6 fL (ref 78.0–100.0)
PLATELETS: 191 10*3/uL (ref 150–400)
RBC: 4.26 MIL/uL (ref 4.22–5.81)
RDW: 13.7 % (ref 11.5–15.5)
WBC: 22.7 10*3/uL — ABNORMAL HIGH (ref 4.0–10.5)

## 2016-01-20 LAB — GLUCOSE, CAPILLARY
GLUCOSE-CAPILLARY: 184 mg/dL — AB (ref 65–99)
GLUCOSE-CAPILLARY: 195 mg/dL — AB (ref 65–99)
GLUCOSE-CAPILLARY: 246 mg/dL — AB (ref 65–99)
Glucose-Capillary: 149 mg/dL — ABNORMAL HIGH (ref 65–99)
Glucose-Capillary: 229 mg/dL — ABNORMAL HIGH (ref 65–99)
Glucose-Capillary: 246 mg/dL — ABNORMAL HIGH (ref 65–99)

## 2016-01-20 LAB — MAGNESIUM: MAGNESIUM: 2.1 mg/dL (ref 1.7–2.4)

## 2016-01-20 LAB — PHOSPHORUS: PHOSPHORUS: 4.1 mg/dL (ref 2.5–4.6)

## 2016-01-20 MED ORDER — FENTANYL CITRATE (PF) 100 MCG/2ML IJ SOLN
200.0000 ug | Freq: Once | INTRAMUSCULAR | Status: DC
Start: 2016-01-20 — End: 2016-01-23

## 2016-01-20 MED ORDER — VECURONIUM BROMIDE 10 MG IV SOLR: 10.0000 mg | Freq: Once | INTRAVENOUS | Status: DC

## 2016-01-20 MED ORDER — ETOMIDATE 2 MG/ML IV SOLN: 40.0000 mg | Freq: Once | INTRAVENOUS | Status: DC

## 2016-01-20 MED ORDER — PROPOFOL 500 MG/50ML IV EMUL: 5.0000 ug/kg/min | Freq: Once | INTRAVENOUS | Status: DC

## 2016-01-20 MED ORDER — MIDAZOLAM HCL 2 MG/2ML IJ SOLN: 4.0000 mg | Freq: Once | INTRAMUSCULAR | Status: DC

## 2016-01-20 NOTE — Progress Notes (Signed)
PULMONARY / CRITICAL CARE MEDICINE   Name: Henry Hardin MRN: [id number] DOB: 02/25/1985    ADMISSION DATE:  01/09/2016 CONSULTATION DATE:  01/10/2016  REFERRING MD:  Dr. Ethelda ChickJacubowitz  CHIEF COMPLAINT:  SOB  HISTORY OF PRESENT ILLNESS:   30 year old male with PMH asthma since childhood. He has never been consistent with taking his controller medications and has been unable to get albuterol due to insurance issues per mother. He has been smoking about a pack per day for the past 6 years during which time his symptoms have gotten worse. He has been seen in the ED 8 times in the past 6 months with exacerbations of asthma. He has been intubated once in the past. 11/22 he presented to the ED with CC respiratory distress for about 3 hours. He was treated with mag, albuterol, and atrovent by EMS. He was additionally treated with solumedrol, and additional bronchodilators in ED without improvement. His mental status began to worsen and he required intubation. PCCM asked to admit.   SUBJECTIVE:  Likes psv much better than prvc  VITAL SIGNS: BP 101/61   Pulse 83   Temp 98.6 F (37 C)   Resp 16   Ht 6\' 1"  (1.854 m)   Wt 171 lb 4.8 oz (77.7 kg)   SpO2 94%   BMI 22.60 kg/m   HEMODYNAMICS:    VENTILATOR SETTINGS: Vent Mode: PSV;CPAP FiO2 (%):  [40 %] 40 % Set Rate:  [12 bmp-14 bmp] 14 bmp Vt Set:  [700 mL] 700 mL PEEP:  [5 cmH20] 5 cmH20 Pressure Support:  [5 cmH20] 5 cmH20 Plateau Pressure:  [13 cmH20-18 cmH20] 18 cmH20  INTAKE / OUTPUT: I/O last 3 completed shifts: In: 3366 [I.V.:1187.5; NWGNF:621Other:240; HY/QM:5784.6G/GT:1438.5; IV Piggyback:500] Out: 5325 [Urine:5325]  PHYSICAL EXAMINATION: General:  Young adult male, paradoxical Neuro:  Sedated, RASS -2 HEENT:  [place]/AT, PERRL, no JVD, copious thick orasl secretions Cardiovascular:  s1s2 regular, no MRG Lungs: non-labored, breath stacking on vent, soft wheeze bilaterally Abdomen:  Soft, non-tender, non-distended Musculoskeletal:  No acute  deformity or ROM limitation Skin:  Grossly intact  LABS:  BMET  Recent Labs Lab 01/18/16 0427 01/19/16 0330 01/20/16 0425  NA 137 138 138  K 4.7 4.1 3.8  CL 97* 97* 98*  CO2 33* 32 31  BUN 25* 30* 34*  CREATININE 0.77 0.87 0.84  GLUCOSE 199* 188* 183*    Electrolytes  Recent Labs Lab 01/13/16 1411  01/14/16 0530  01/18/16 0427 01/19/16 0330 01/20/16 0425  CALCIUM 7.1*  < > 7.7*  < > 8.0* 8.3* 8.4*  MG 2.8*  --  2.6*  < > 2.2 2.0 2.1  PHOS 6.1*  --  2.2*  --   --   --  4.1  < > = values in this interval not displayed.  CBC  Recent Labs Lab 01/18/16 0427 01/19/16 0330 01/20/16 0425  WBC 17.5* 20.0* 22.7*  HGB 12.4* 12.6* 12.7*  HCT 38.1* 38.8* 38.6*  PLT 199 199 191    Coag's No results for input(s): APTT, INR in the last 168 hours.  Sepsis Markers  Recent Labs Lab 01/13/16 1450 01/14/16 0454 01/15/16 0500  PROCALCITON 1.57 1.90 1.69    ABG  Recent Labs Lab 01/14/16 1344 01/15/16 1105 01/19/16 0758  PHART 7.397 7.396 7.481*  PCO2ART 71.7* 64.4* 45.8  PO2ART 53.3* 84.8 61.9*    Liver Enzymes No results for input(s): AST, ALT, ALKPHOS, BILITOT, ALBUMIN in the last 168 hours.  Cardiac Enzymes No results  for input(s): TROPONINI, PROBNP in the last 168 hours.  Glucose  Recent Labs Lab 01/19/16 1156 01/19/16 1521 01/19/16 1925 01/20/16 0025 01/20/16 0436 01/20/16 0732  GLUCAP 224* 126* 186* 246* 184* 195*    Imaging Dg Chest Port 1 View  Result Date: 01/20/2016 CLINICAL DATA:  ETT. EXAM: PORTABLE CHEST 1 VIEW COMPARISON:  January 19, 2016 FINDINGS: The ET tube has been removed. The NG tube terminates in the left upper quadrant with the side port in the stomach. The right PICC line is stable. The right lung is clear. Lucency is seen along the left heart border, similar to 1 of the studies from yesterday. This air is probably in the mediastinum when reviewing previous studies. A small medial pneumothorax is could demonstrate a  similar appearance. No other evidence of pneumothorax. Persistent opacity in the left retrocardiac region with probable small layering effusion on the left, similar in the interval. IMPRESSION: 1. Removal of ET tube.  Other support apparatus is stable. 2. Continued air along the left heart border which could be mediastinal or pleural. This is unchanged and this result was called to the patient's nurse several times yesterday. 3. Persistent opacity and probable associated effusion in the left base. Electronically Signed   By: Gerome Sam III M.D   On: 01/20/2016 07:23   Dg Chest Port 1 View  Result Date: 01/19/2016 CLINICAL DATA:  ET and OG tube placement EXAM: PORTABLE CHEST 1 VIEW COMPARISON:  January 19, 2016 FINDINGS: Opacity remains in the left retrocardiac region. Possible small layering effusion on the left. The ETT is in good position. The side port of the NG tube is just above the GE junction with the distal tip in the body of the stomach. Mottled lucency over the mediastinum suggest mediastinal air. No definite pneumothorax. The right PICC line is in good position. The right lung is clear. IMPRESSION: 1. The ET is in good position. 2. The side port of the NG tube is just above the GE junction. 3. Suggested mediastinal air. 4. Persistent opacity in the left retrocardiac region with volume loss on the left. I suspect significant collapse/atelectasis of the left lower lobe. Findings called to the patient's nurse, Henry Hardin Electronically Signed   By: Gerome Sam III M.D   On: 01/19/2016 15:38   Dg Chest Port 1 View  Result Date: 01/19/2016 CLINICAL DATA:  Respiratory failure.  Severe untreated asthma. EXAM: PORTABLE CHEST 1 VIEW COMPARISON:  January 19, 2016 FINDINGS: Lucency along the left heart border is new. Suspected opacity in the left retrocardiac region. Haziness also suggests some layering effusion. The right PICC line is stable. The ETT is in good position. The NG tube terminates below  today's film. The hila and mediastinum are unchanged. IMPRESSION: 1. Lucency along the left heart border could represent pneumopericardium, pneumomediastinum, or a small medial pneumothorax. 2. Increasing density in the region left heart suspected to represent left retrocardiac opacity, possibly from left lower lobe atelectasis or collapse with suspected associated effusion. 3. Support apparatus is stable. Findings called to the patient's nurse, Henry Hardin. A CT scan could better evaluate as clinically warranted. Electronically Signed   By: Gerome Sam III M.D   On: 01/19/2016 12:57   Dg Abd Portable 1v  Result Date: 01/19/2016 CLINICAL DATA:  Orogastric tube placement. EXAM: PORTABLE ABDOMEN - 1 VIEW COMPARISON:  03/15/2015 FINDINGS: Enteric tube is seen with tip in the proximal stomach. No evidence of dilated bowel loops. Retrocardiac left lower lobe collapse or consolidation noted.  IMPRESSION: Enteric tube tip overlies the proximal stomach. Retrocardiac left lower lobe collapse versus consolidation. Electronically Signed   By: Myles RosenthalJohn  Hardin M.D.   On: 01/19/2016 15:37     STUDIES:    CULTURES: MRSA (-) Trache asp 11/24 > (-) Blood 11/24 (-) Trach 11/26  > MSSA  ANTIBIOTICS: Azithro 11/24 > 11/26 Zosyn 11/26 >> 11/28 vanco x 1 11/28 Ancef 11/28 >>  SIGNIFICANT EVENTS: 11/22  intubated for asthma 11/24  air trapping 11/28  paralysis off 12/2 failed extubation 12/3 plan for trach>>>  LINES/TUBES: ETT 11/23 >>12/2 promptly failed and re intubated 12/2>> RUE PICC 11/26 >>  DISCUSSION: 30 year old male smoker with asthma since childhood, poorly compliant r/t cost. Frequent visits/admissions.  Admitted 11/23 with status asthmaticus requiring intubation. Has required NMB, continues to have significant resp acidosis. He will need trach and chronic wean from vent.  ASSESSMENT / PLAN:  Acute hypercarbic respiratory failure secondary to status asthmaticus:  No focal opacification on CXR.   MSSA Bronchitis, at risk evolving PNA Severe Resp acidosis  Plan: Goal for negative balance Monitor for autopeep Plan for trach then start weaning. Failed extubation 12/2 Solu-medrol 60 mg IV Q12 Brovana + Pulmicort  Intermittent CXR He needs an attempt at extubation but concern for CIP with prolonged intubation he will require trach. Note 12/3 ET not visible on CxR. ETT at 19 cm at lips. Will advance 6 cm.  May trach today if there is time. Tube feeds are off.  Acute metabolic encephalopathy At Risk Deconditioning   Plan: Propofol/fentanyl gtts and versed added 11/29 Wean versed to off first if able RASS goal -2 Ativan 2mg  IV Q8 + Risperdal in hopes to reduce drips, not wotking Passive ROM   Hypotension - resolved  Plan:  Tele Allow neg balance on own No role lasix  MSSA PNA Ancef #5/10, will need totoal duration abx 8-10 days  Constipation At Risk Malnutrition  Continue Miralax  TF Pepcid  Best practice VTE ppx: SCDs and SQ heparin Diet: NPO, TF for nutrition    Family: Family advised of need for tracheostomy 12/2  CC Time:  30 minutes   Henry Hardin ACNP Henry Hardin PCCM Pager [phone] till 3 pm If no answer page [phone] 01/20/2016, 8:31 AM  Attending Note:  30 year old male smoker with asthma presenting with status asthmaticus requiring intubation 11 days ago.  On WUA he did not fully arouse.  On exam, lung sounds with no wheezing.  I reviewed CXR myself, ETT very high, move it down.  Will minimize sedation and evaluate mental status. Patient failed extubation on Saturday then reintubated and bronched.  Clearly unable to clear secretions.  Weaning today but belly breathing.  Will continue even volume.  Spoke with mother, ready for trach.  Will perform today if able, if not then tomorrow.  The patient is critically ill with multiple organ systems failure and requires high complexity decision making for assessment and support, frequent evaluation and  titration of therapies, application of advanced monitoring technologies and extensive interpretation of multiple databases.   Critical Care Time devoted to patient care services described in this note is  35  Minutes. This time reflects time of care of this signee Dr Koren BoundWesam Finbar Hardin. This critical care time does not reflect procedure time, or teaching time or supervisory time of PA/NP/Med student/Med Resident etc but could involve care discussion time.  Alyson ReedyWesam G. Chase Knebel, M.D. [facility]. Pager: [phone]. After hours pager: [phone].

## 2016-01-20 NOTE — Progress Notes (Signed)
RT found ETT secured at 19 - Per MD advance 5cm. Rt secure ETT at 24 lips. Pt tol well

## 2016-01-21 ENCOUNTER — Inpatient Hospital Stay (HOSPITAL_COMMUNITY): Payer: Self-pay

## 2016-01-21 ENCOUNTER — Encounter (HOSPITAL_COMMUNITY): Payer: Self-pay

## 2016-01-21 LAB — GLUCOSE, CAPILLARY
GLUCOSE-CAPILLARY: 146 mg/dL — AB (ref 65–99)
GLUCOSE-CAPILLARY: 176 mg/dL — AB (ref 65–99)
GLUCOSE-CAPILLARY: 221 mg/dL — AB (ref 65–99)
GLUCOSE-CAPILLARY: 239 mg/dL — AB (ref 65–99)
GLUCOSE-CAPILLARY: 190 mg/dL — AB (ref 65–99)
GLUCOSE-CAPILLARY: 204 mg/dL — AB (ref 65–99)

## 2016-01-21 LAB — PROTIME-INR
INR: 1.1
INR: 1.1
PROTHROMBIN TIME: 14.2 s (ref 11.4–15.2)
Prothrombin Time: 14.3 seconds (ref 11.4–15.2)

## 2016-01-21 LAB — CBC
HEMATOCRIT: 37.5 % — AB (ref 39.0–52.0)
Hemoglobin: 12.1 g/dL — ABNORMAL LOW (ref 13.0–17.0)
MCH: 29.5 pg (ref 26.0–34.0)
MCHC: 32.3 g/dL (ref 30.0–36.0)
MCV: 91.5 fL (ref 78.0–100.0)
PLATELETS: 185 10*3/uL (ref 150–400)
RBC: 4.1 MIL/uL — ABNORMAL LOW (ref 4.22–5.81)
RDW: 13.7 % (ref 11.5–15.5)
WBC: 19.4 10*3/uL — ABNORMAL HIGH (ref 4.0–10.5)

## 2016-01-21 LAB — BASIC METABOLIC PANEL
Anion gap: 7 (ref 5–15)
Anion gap: 9 (ref 5–15)
BUN: 32 mg/dL — AB (ref 6–20)
BUN: 33 mg/dL — AB (ref 6–20)
CHLORIDE: 100 mmol/L — AB (ref 101–111)
CHLORIDE: 99 mmol/L — AB (ref 101–111)
CO2: 30 mmol/L (ref 22–32)
CO2: 29 mmol/L (ref 22–32)
CREATININE: 0.87 mg/dL (ref 0.61–1.24)
Calcium: 8.4 mg/dL — ABNORMAL LOW (ref 8.9–10.3)
Calcium: 8.4 mg/dL — ABNORMAL LOW (ref 8.9–10.3)
Creatinine, Ser: 0.9 mg/dL (ref 0.61–1.24)
GFR calc Af Amer: >60 mL/min (ref >60)
GFR calc Af Amer: >60 mL/min (ref >60)
GFR calc non Af Amer: >60 mL/min (ref >60)
GFR calc non Af Amer: >60 mL/min (ref >60)
GLUCOSE: 223 mg/dL — AB (ref 65–99)
GLUCOSE: 224 mg/dL — AB (ref 65–99)
POTASSIUM: 4.6 mmol/L (ref 3.5–5.1)
Potassium: 4 mmol/L (ref 3.5–5.1)
SODIUM: 137 mmol/L (ref 135–145)
Sodium: 137 mmol/L (ref 135–145)

## 2016-01-21 LAB — APTT
aPTT: 25 seconds (ref 24–36)
aPTT: 25 seconds (ref 24–36)

## 2016-01-21 LAB — PNEUMOCYSTIS JIROVECI SMEAR BY DFA: PNEUMOCYSTIS JIROVECI AG: NEGATIVE

## 2016-01-21 MED ORDER — FENTANYL CITRATE (PF) 100 MCG/2ML IJ SOLN: 200.0000 ug | Freq: Once | INTRAMUSCULAR | Status: DC

## 2016-01-21 MED ORDER — MIDAZOLAM HCL 2 MG/2ML IJ SOLN
4.0000 mg | Freq: Once | INTRAMUSCULAR | Status: AC
  Administered 2016-01-22: 4 mg via INTRAVENOUS
  Filled 2016-01-21: qty 4

## 2016-01-21 MED ORDER — VECURONIUM BROMIDE 10 MG IV SOLR
10.0000 mg | Freq: Once | INTRAVENOUS | Status: AC
  Administered 2016-01-22: 10 mg via INTRAVENOUS

## 2016-01-21 MED ORDER — PROPOFOL 500 MG/50ML IV EMUL: 5.0000 ug/kg/min | Freq: Once | INTRAVENOUS | Status: DC

## 2016-01-21 MED ORDER — ETOMIDATE 2 MG/ML IV SOLN: 40.0000 mg | Freq: Once | INTRAVENOUS | Status: DC

## 2016-01-21 NOTE — Progress Notes (Signed)
PULMONARY / CRITICAL CARE MEDICINE   Name: Enzo BiReegius L Preble MRN: [id number] DOB: 04/20/1985    ADMISSION DATE:  01/09/2016 CONSULTATION DATE:  01/10/2016  REFERRING MD:  Dr. Ethelda ChickJacubowitz  CHIEF COMPLAINT:  SOB  HISTORY OF PRESENT ILLNESS:   30 year old male with PMH asthma since childhood. He has never been consistent with taking his controller medications and has been unable to get albuterol due to insurance issues per mother. He has been smoking about a pack per day for the past 6 years during which time his symptoms have gotten worse. He has been seen in the ED 8 times in the past 6 months with exacerbations of asthma. He has been intubated once in the past. 11/22 he presented to the ED with CC respiratory distress for about 3 hours. He was treated with mag, albuterol, and atrovent by EMS. He was additionally treated with solumedrol, and additional bronchodilators in ED without improvement. His mental status began to worsen and he required intubation. PCCM asked to admit.   SUBJECTIVE: weaning ps 12  VITAL SIGNS: BP 105/68   Pulse 88   Temp 98.5 F (36.9 C) (Axillary)   Resp 20   Ht 6\' 1"  (1.854 m)   Wt 77.2 kg (170 lb 3.1 oz)   SpO2 94%   BMI 22.45 kg/m   HEMODYNAMICS:    VENTILATOR SETTINGS: Vent Mode: PCV FiO2 (%):  [40 %] 40 % Set Rate:  [14 bmp] 14 bmp PEEP:  [5 cmH20] 5 cmH20 Pressure Support:  [5 cmH20] 5 cmH20  INTAKE / OUTPUT: I/O last 3 completed shifts: In: 2827 [I.V.:1092.4; Other:120; NG/GT:1114.6; IV Piggyback:500] Out: 3775 [Urine:3775]  PHYSICAL EXAMINATION: General:  Young adult male,eyes open Neuro:  Sedated, RASS -1 HEENT:  [place]/AT, PERRL, no JVD, copious thick orasl secretions Cardiovascular:  s1s2 regular, no MRG Lungs: less paradoxical breathing, tonchi Abdomen:  Soft, non-tender, non-distended Musculoskeletal:  No acute deformity or ROM limitation Skin:  Grossly intact  LABS:  BMET  Recent Labs Lab 01/19/16 0330 01/20/16 0425  01/21/16 0503  NA 138 138 137  K 4.1 3.8 4.0  CL 97* 98* 100*  CO2 32 31 30  BUN 30* 34* 32*  CREATININE 0.87 0.84 0.87  GLUCOSE 188* 183* 223*    Electrolytes  Recent Labs Lab 01/18/16 0427 01/19/16 0330 01/20/16 0425 01/21/16 0503  CALCIUM 8.0* 8.3* 8.4* 8.4*  MG 2.2 2.0 2.1  --   PHOS  --   --  4.1  --     CBC  Recent Labs Lab 01/19/16 0330 01/20/16 0425 01/21/16 0503  WBC 20.0* 22.7* 19.4*  HGB 12.6* 12.7* 12.1*  HCT 38.8* 38.6* 37.5*  PLT 199 191 185    Coag's No results for input(s): APTT, INR in the last 168 hours.  Sepsis Markers  Recent Labs Lab 01/15/16 0500  PROCALCITON 1.69    ABG  Recent Labs Lab 01/14/16 1344 01/15/16 1105 01/19/16 0758  PHART 7.397 7.396 7.481*  PCO2ART 71.7* 64.4* 45.8  PO2ART 53.3* 84.8 61.9*    Liver Enzymes No results for input(s): AST, ALT, ALKPHOS, BILITOT, ALBUMIN in the last 168 hours.  Cardiac Enzymes No results for input(s): TROPONINI, PROBNP in the last 168 hours.  Glucose  Recent Labs Lab 01/20/16 0436 01/20/16 0732 01/20/16 1128 01/20/16 1614 01/20/16 2321 01/21/16 0336  GLUCAP 184* 195* 229* 149* 246* 176*    Imaging Dg Chest Port 1 View  Result Date: 01/21/2016 CLINICAL DATA:  Acute respiratory failure, history of asthma, pneumonia,  current smoker. EXAM: PORTABLE CHEST 1 VIEW COMPARISON:  Portable chest x-ray of January 20, 2016 FINDINGS: The patient is mildly rotated toward the left. The right lung is mildly hyperinflated and clear. On the left there is lower lobe atelectasis. There is no pneumothorax or pleural effusion. The upper lobe appears hyperinflated. The cardiac silhouette is normal in size. The pulmonary vascularity is not engorged. The esophagogastric tube has its proximal port just below the level of the GE junction. The observed bony thorax exhibits no acute abnormality. IMPRESSION: Dense left lower lobe atelectasis or pneumonia with mild shift of the mediastinum from right  to left accentuated by patient rotation. No pneumothorax nor definite pneumomediastinum nor significant pleural effusion. Advancement of the esophagogastric tube by approximately 5-10 cm would assure that the proximal port remains below the GE junction. Electronically Signed   By: David  SwazilandJordan M.D.   On: 01/21/2016 07:38     STUDIES:    CULTURES: MRSA (-) Trache asp 11/24 > (-) Blood 11/24 (-) Trach 11/26  > MSSA  ANTIBIOTICS: Azithro 11/24 > 11/26 Zosyn 11/26 >> 11/28 vanco x 1 11/28 Ancef 11/28 >>  SIGNIFICANT EVENTS: 11/22  intubated for asthma 11/24  air trapping 11/28  paralysis off 12/2 failed extubation  LINES/TUBES: ETT 11/23 >>12/2 promptly failed and re intubated 12/2>> RUE PICC 11/26 >>  DISCUSSION: 30 year old male smoker with asthma since childhood, poorly compliant r/t cost. Frequent visits/admissions.  Admitted 11/23 with status asthmaticus requiring intubation. Has required NMB, continues to have significant resp acidosis. He will need trach and chronic wean from vent.  ASSESSMENT / PLAN:  Acute hypercarbic respiratory failure secondary to status asthmaticus:  No focal opacification on CXR.  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 Failed extubation 12/2 Solu-medrol 60 mg IV Q12, likley can reduce  Brovana + Pulmicort  adance ett 7 cm and check pcxr, I dont see ett on pcxr, will need to dropcuff then advance , may need to place scope to do this May trach today if there is time. Tube feeds are off.  Acute metabolic encephalopathy At Risk Deconditioning   Plan: Propofol/fentanyl gtts and versed added 11/29 RASS goal -2 Ativan 2mg  IV Q8 + Risperdal, post trach likley will abort, taper Passive ROM  Consider EMG, will d/w neuro if we have  Hypotension - resolved  Plan:  Tele Allow neg balance on own, BP wnl, tolerating  MSSA  PNA Ancef for 10 days  Constipation At Risk Malnutrition  Continue Miralax  TF hold 5 am  Pepcid  Best practice VTE ppx: SCDs and SQ heparin Diet: NPO, TF for nutrition    Family: none in room  CC Time:  30 minutes   Mcarthur Rossettianiel J. Tyson AliasFeinstein, MD, FACP Pgr: [phone] [place] [facility]

## 2016-01-21 NOTE — Procedures (Signed)
Intubation Procedure Note Henry Hardin [id number] 11/07/1985  Procedure: Intubation Indications: Respiratory insufficiency  Procedure Details Consent: Risks of procedure as well as the alternatives and risks of each were explained to the (patient/caregiver).  Consent for procedure obtained. Time Out: Verified patient identification, verified procedure, site/side was marked, verified correct patient position, special equipment/implants available, medications/allergies/relevent history reviewed, required imaging and test results available.  Performed  Maximum sterile technique was used including gloves, gown and hand hygiene.  MAC and 4    Evaluation Hemodynamic Status: BP stable throughout; O2 sats: stable throughout Patient's Current Condition: stable Complications: No apparent complications Patient did tolerate procedure well. Chest X-ray ordered to verify placement.  CXR: pending.   Nelda BucksFEINSTEIN,DANIEL J. 01/21/2016  2 days in a row, ETT not on pcxr I palced glide, noted cuff herniated above cords Drop cuff Advance through cords easily  Mcarthur Rossettianiel J. Tyson AliasFeinstein, MD, FACP Pgr: [phone] [place] [facility]

## 2016-01-21 NOTE — Progress Notes (Signed)
Pt. Had 1 occurrence of tan colored emesis. MD aware. Instructed to hold TF until after CXR this morning.  Gari CrownAshley Yasir Kitner, RN

## 2016-01-21 NOTE — Progress Notes (Addendum)
ETT advanced by Dr Tyson AliasFeinstein with glidescope. 90mg  propofol bolus given as well as 100mcg fentanyl bolus. F/U CXR ordered. Wife Grayland OrmondMartika Leckey notified of change in time for tracheostomy procedure to 01/22/16 at 11:00 AM.

## 2016-01-22 ENCOUNTER — Inpatient Hospital Stay (HOSPITAL_COMMUNITY): Payer: Self-pay

## 2016-01-22 LAB — CULTURE, BAL-QUANTITATIVE W GRAM STAIN: Culture: NO GROWTH

## 2016-01-22 LAB — COMPREHENSIVE METABOLIC PANEL
ALBUMIN: 2.6 g/dL — AB (ref 3.5–5.0)
ALK PHOS: 57 U/L (ref 38–126)
ALT: 126 U/L — AB (ref 17–63)
AST: 88 U/L — AB (ref 15–41)
Anion gap: 9 (ref 5–15)
BILIRUBIN TOTAL: 0.6 mg/dL (ref 0.3–1.2)
BUN: 29 mg/dL — AB (ref 6–20)
CALCIUM: 8.9 mg/dL (ref 8.9–10.3)
CO2: 31 mmol/L (ref 22–32)
CREATININE: 0.85 mg/dL (ref 0.61–1.24)
Chloride: 98 mmol/L — ABNORMAL LOW (ref 101–111)
GFR calc Af Amer: >60 mL/min (ref >60)
GFR calc non Af Amer: >60 mL/min (ref >60)
GLUCOSE: 113 mg/dL — AB (ref 65–99)
Potassium: 3.9 mmol/L (ref 3.5–5.1)
Sodium: 138 mmol/L (ref 135–145)
TOTAL PROTEIN: 6.3 g/dL — AB (ref 6.5–8.1)

## 2016-01-22 LAB — GLUCOSE, CAPILLARY
GLUCOSE-CAPILLARY: 139 mg/dL — AB (ref 65–99)
GLUCOSE-CAPILLARY: 198 mg/dL — AB (ref 65–99)
GLUCOSE-CAPILLARY: 203 mg/dL — AB (ref 65–99)
Glucose-Capillary: 130 mg/dL — ABNORMAL HIGH (ref 65–99)
Glucose-Capillary: 137 mg/dL — ABNORMAL HIGH (ref 65–99)
Glucose-Capillary: 184 mg/dL — ABNORMAL HIGH (ref 65–99)
Glucose-Capillary: 203 mg/dL — ABNORMAL HIGH (ref 65–99)

## 2016-01-22 LAB — CULTURE, BAL-QUANTITATIVE: SPECIAL REQUESTS: NORMAL

## 2016-01-22 LAB — TRIGLYCERIDES: Triglycerides: 134 mg/dL (ref <150)

## 2016-01-22 MED ORDER — BUDESONIDE 0.5 MG/2ML IN SUSP
0.5000 mg | Freq: Two times a day (BID) | RESPIRATORY_TRACT | Status: DC
  Administered 2016-01-22 – 2016-01-27 (×10): 0.5 mg via RESPIRATORY_TRACT
  Filled 2016-01-22 (×10): qty 2

## 2016-01-22 MED ORDER — IPRATROPIUM-ALBUTEROL 0.5-2.5 (3) MG/3ML IN SOLN
3.0000 mL | Freq: Four times a day (QID) | RESPIRATORY_TRACT | Status: DC
  Administered 2016-01-22 – 2016-01-27 (×20): 3 mL via RESPIRATORY_TRACT
  Filled 2016-01-22 (×20): qty 3

## 2016-01-22 NOTE — Progress Notes (Signed)
PULMONARY / CRITICAL CARE MEDICINE   Name: Henry Hardin MRN: [id number] DOB: 09/15/1985    ADMISSION DATE:  01/09/2016 CONSULTATION DATE:  01/10/2016  REFERRING MD:  Dr. Ethelda ChickJacubowitz  CHIEF COMPLAINT:  SOB  HISTORY OF PRESENT ILLNESS:   30 year old male with PMH asthma since childhood. He has never been consistent with taking his controller medications and has been unable to get albuterol due to insurance issues per mother. He has been smoking about a pack per day for the past 6 years during which time his symptoms have gotten worse. He has been seen in the ED 8 times in the past 6 months with exacerbations of asthma. He has been intubated once in the past. 11/22 he presented to the ED with CC respiratory distress for about 3 hours. He was treated with mag, albuterol, and atrovent by EMS. He was additionally treated with solumedrol, and additional bronchodilators in ED without improvement. His mental status began to worsen and he required intubation. PCCM asked to admit.   SUBJECTIVE: weaning ps 12 for1 hour then failed  VITAL SIGNS: BP 105/61   Pulse 90   Temp 99.6 F (37.6 C) (Axillary)   Resp 17   Ht 6\' 1"  (1.854 m)   Wt 158 lb 15.2 oz (72.1 kg)   SpO2 92%   BMI 20.97 kg/m   HEMODYNAMICS:    VENTILATOR SETTINGS: Vent Mode: PCV FiO2 (%):  [40 %] (P) 40 % Set Rate:  [14 bmp-20 bmp] 14 bmp PEEP:  [5 cmH20] 5 cmH20 Plateau Pressure:  [12 cmH20-21 cmH20] 18 cmH20  INTAKE / OUTPUT: I/O last 3 completed shifts: In: 2562.6 [I.V.:1080.6; NG/GT:982; IV Piggyback:500] Out: 3275 [Urine:3275]  PHYSICAL EXAMINATION: General:  Young adult male,eyes open, heavily sedated Neuro:  Sedated, RASS -1 HEENT:  [place]/AT, PERRL, no JVD, copious thick orasl secretions Cardiovascular:  s1s2 regular, no MRG Lungs: less paradoxical breathing, Rhonchi, mild abd cw paradoxus when stmulated Abdomen:  Soft, non-tender, non-distended Musculoskeletal:  No acute deformity or ROM limitation Skin:   Grossly intact  LABS:  BMET  Recent Labs Lab 01/21/16 0503 01/21/16 0900 01/22/16 0500  NA 137 137 138  K 4.0 4.6 3.9  CL 100* 99* 98*  CO2 30 29 31   BUN 32* 33* 29*  CREATININE 0.87 0.90 0.85  GLUCOSE 223* 224* 113*    Electrolytes  Recent Labs Lab 01/18/16 0427 01/19/16 0330 01/20/16 0425 01/21/16 0503 01/21/16 0900 01/22/16 0500  CALCIUM 8.0* 8.3* 8.4* 8.4* 8.4* 8.9  MG 2.2 2.0 2.1  --   --   --   PHOS  --   --  4.1  --   --   --     CBC  Recent Labs Lab 01/19/16 0330 01/20/16 0425 01/21/16 0503  WBC 20.0* 22.7* 19.4*  HGB 12.6* 12.7* 12.1*  HCT 38.8* 38.6* 37.5*  PLT 199 191 185    Coag's  Recent Labs Lab 01/21/16 0815 01/21/16 0900  APTT 25 25  INR 1.10 1.10    Sepsis Markers No results for input(s): LATICACIDVEN, PROCALCITON, O2SATVEN in the last 168 hours.  ABG  Recent Labs Lab 01/15/16 1105 01/19/16 0758  PHART 7.396 7.481*  PCO2ART 64.4* 45.8  PO2ART 84.8 61.9*    Liver Enzymes  Recent Labs Lab 01/22/16 0500  AST 88*  ALT 126*  ALKPHOS 57  BILITOT 0.6  ALBUMIN 2.6*    Cardiac Enzymes No results for input(s): TROPONINI, PROBNP in the last 168 hours.  Glucose  Recent Labs  Lab 01/21/16 1153 01/21/16 1533 01/21/16 1950 01/21/16 2351 01/22/16 0349 01/22/16 0818  GLUCAP 239* 190* 204* 203* 130* 184*    Imaging Dg Chest Port 1 View  Result Date: 01/22/2016 CLINICAL DATA:  Intubation EXAM: PORTABLE CHEST 1 VIEW COMPARISON:  Yesterday FINDINGS: Endotracheal tube tip just below the clavicular heads. An orogastric tube reaches the side port. Right upper extremity PICC with tip at the SVC. Unchanged collapse of the left lower lobe. Remainder of the lungs are clear. Normal heart size. No pneumothorax. IMPRESSION: 1. Tubes and central line are in good position. 2. Continued left lower lobe collapse. Electronically Signed   By: Marnee Spring M.D.   On: 01/22/2016 08:18   Dg Chest Port 1 View  Result Date:  01/21/2016 CLINICAL DATA:  30 year old male with respiratory failure. Endotracheal tube advanced. Initial encounter. EXAM: PORTABLE CHEST 1 VIEW COMPARISON:  0620 hours today and earlier. FINDINGS: Portable AP semi upright view at at 0925 hours. Endotracheal tube tip now projects in good position between the level the clavicles and carina. Enteric tube probably also was advanced, the side hole now appears to be distal to the level of the diaphragm. Mediastinal contours remain normal. Left lower lobe collapse or consolidation. No other abnormal pulmonary opacity identified. No pneumothorax. IMPRESSION: 1. Endotracheal tube tip in good position between the clavicles and carina. Visible enteric tube also appears satisfactory. 2. Continued left lower lobe collapse or consolidation. Electronically Signed   By: Odessa Fleming M.D.   On: 01/21/2016 09:34     STUDIES:    CULTURES: MRSA (-) Trache asp 11/24 > (-) Blood 11/24 (-) Trach 11/26  > MSSA  ANTIBIOTICS: Azithro 11/24 > 11/26 Zosyn 11/26 >> 11/28 vanco x 1 11/28 Ancef 11/28 >>  SIGNIFICANT EVENTS: 11/22  intubated for asthma 11/24  air trapping 11/28  paralysis off 12/2 failed extubation  LINES/TUBES: ETT 11/23 >>12/2 promptly failed and re intubated 12/2>> RUE PICC 11/26 >>  DISCUSSION: 30 year old male smoker with asthma since childhood, poorly compliant r/t cost. Frequent visits/admissions.  Admitted 11/23 with status asthmaticus requiring intubation. Has required NMB, continues to have significant resp acidosis. He will need trach and chronic wean from vent.  ASSESSMENT / PLAN:  Acute hypercarbic respiratory failure secondary to status asthmaticus:  No focal opacification on CXR.  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 Failed extubation 12/2 Solu-medrol 60 mg IV Q12,  likley can reduce  Brovana + Pulmicort  adance ett 7 cm and check pcxr, I dont see ett on pcxr, will need to dropcuff then advance , may need to place scope to do this May trach today if there is time. Tube feeds are off.  Acute metabolic encephalopathy At Risk Deconditioning  Suspect CIP  Plan: Propofol/fentanyl gtts and versed added 11/29 RASS goal -2 Ativan 2mg  IV Q8 + Risperdal, post trach likley will abort, taper Passive ROM  Consider EMG, will d/w neuro if we have  Hypotension - resolved  Plan:  Tele Allow neg balance on own, BP wnl, tolerating  MSSA PNA Ancef for 10 days, stop date 12/8  Constipation At Risk Malnutrition  Continue Miralax  TF hold 5 am  Pepcid  Best practice VTE ppx: SCDs and SQ heparin Diet: NPO, TF for nutrition    Family: none in room  CC Time:  30 minutes  Brett CanalesSteve Minor ACNP [facility] Pager [phone] till 3 pm If no answer page [phone] 01/22/2016, 8:32 AM  STAFF NOTE: I, Rory Percyaniel Feinstein, MD FACP have personally reviewed patient's available data, including medical history, events of note, physical examination and test results as part of my evaluation. I have discussed with resident/NP and other care providers such as pharmacist, RN and RRT. In addition, I personally evaluated patient and elicited key findings of: sedated, coarse BS, no wheezing, improved, pcxr without infiltrate change LLL, reduce steroids, Bders maintain, trach done, then reduce sedation and assess muslce power, will need to assess Nif, VC, NO PEG planned, likely can liberate unless this is sig contibution frmo myoapthy, will extend MSSA coverage to 2 weeks given lack of progress and bronch findings x 2, I updated wife and mom in room The patient is critically ill with multiple organ systems failure and requires high complexity decision making for assessment and support, frequent evaluation and titration of therapies, application of advanced monitoring technologies and  extensive interpretation of multiple databases.   Critical Care Time devoted to patient care services described in this note is 35 Minutes. This time reflects time of care of this signee: Rory Percyaniel Feinstein, MD FACP. This critical care time does not reflect procedure time, or teaching time or supervisory time of PA/NP/Med student/Med Resident etc but could involve care discussion time. Rest per NP/medical resident whose note is outlined above and that I agree with   Mcarthur Rossettianiel J. Tyson AliasFeinstein, MD, FACP Pgr: [phone] [place] [facility] 01/22/2016 12:26 PM

## 2016-01-22 NOTE — Progress Notes (Signed)
Nutrition Follow-up  INTERVENTION:   Once Cortrak placed resume:  Vital AF 1.2 @ 45 ml/hr (1080 ml/day) 30 ml Prostat TID Provides: 1596 kcal, 126 grams protein, and 875 ml H2O.  TF regimen and propofol at current rate providing 2158 total kcal/day    NUTRITION DIAGNOSIS:   Inadequate oral intake related to inability to eat as evidenced by NPO status. Ongoing  GOAL:   Patient will meet greater than or equal to 90% of their needs Met  MONITOR:   TF tolerance, Diet advancement, Vent status, Labs, I & O's  REASON FOR ASSESSMENT:   Consult Enteral/tube feeding initiation and management  ASSESSMENT:   30 y/o male PMHx Asthma w/ poor Compliance and tobacco abuse. Presented to ED w/ respiratory distress. Eventually required intubation. RD consulted for TF.   Pt discussed during ICU rounds and with RN. Per RN will try to decrease sedation.  Noted weight change, unsure of accuracy 175 to 158 lb Noted some muscle loss in lower extremities due to inactivity. Per RN pt has weakness per MD likely related to prolonged period of being paralyzed.   TF held at midnight for trach placement today  Patient is currently intubated on ventilator support MV: 9 L/min Temp (24hrs), Avg:98.9 F (37.2 C), Min:97.7 F (36.5 C), Max:99.6 F (37.6 C)  Propofol: 21.3 ml/hr provides: 562 kcal from lipid Medications reviewed and include: solumedrol, miralax Labs reviewed: TG 134 CBG's: 203-130-184  Diet Order:  Diet NPO time specified  Skin:   (MASD on groin and buttocks)  Last BM:  12/4  Height:   Ht Readings from Last 1 Encounters:  01/09/16 _0  (1.854 m)    Weight:   Wt Readings from Last 1 Encounters:  01/22/16 158 lb 15.2 oz (72.1 kg)    Ideal Body Weight:  83.64 kg  BMI:  Body mass index is 20.97 kg/m.  Estimated Nutritional Needs:   Kcal:  2138  Protein:  112-128 g (1.4-1.6 g/kg bw)  Fluid:  Per MD  EDUCATION NEEDS:   No education needs identified at this  time  [place], [place], [place] Pager [phone] After Hours Pager

## 2016-01-22 NOTE — Procedures (Signed)
Bedside Tracheostomy Insertion Procedure Note   Patient Details:   Name: Henry Hardin DOB: 07/15/1985 MRN: [id number]  Procedure: Tracheostomy  Pre Procedure Assessment: ET Tube Size:7.5 ET Tube secured at lip (cm):24 Bite block in place Breath Sounds bilateral  Post Procedure Assessment: BP 115/66   Pulse (!) 125   Temp 99.6 F (37.6 C) (Axillary)   Resp 20   Ht 6\' 1"  (1.854 m)   Wt 158 lb 15.2 oz (72.1 kg)   SpO2 93%   BMI 20.97 kg/m  O2 sats:98 Complications:no complications Patient did tolerate procedure well Tracheostomy Brand:shiley Tracheostomy Style:cuffed Tracheostomy Size:8.0 Tracheostomy Secured via sutures and velcro ties Tracheostomy Placement Confirmation with direct visualization with bronchoscope   Shanda BumpsBrewer, Othello Dickenson Faye 01/22/2016, 11:11 AM

## 2016-01-22 NOTE — Progress Notes (Signed)
Pt s/p tracheostomy today at bedside.  Pt with deconditioning, extreme muscle weakness, per MD/bedside RN.  Will need PT/OT consults when able to tolerate therapies to evaluate for possible rehab.  Will follow progress.    Quintella BatonJulie W. Lyndall Bellot, RN, BSN  Trauma/Neuro ICU Case Manager [phone]

## 2016-01-22 NOTE — Procedures (Signed)
Name:  [name] MRN:  [id number] DOB:  12/24/1985  OPERATIVE NOTE  Procedure:  Percutaneous tracheostomy.  Indications:  Ventilator-dependent respiratory failure.  Consent:  Procedure, alternatives, risks and benefits discussed with medical POA.  Questions answered.  Consent obtained.  Anesthesia:  Versed, fent, prop, vec  Procedure summary:  Appropriate equipment was assembled.  The patient was identified as Henry Hardin and safety timeout was performed. The patient was placed in supine position with a towel roll behind shoulder blades and neck extended.  Sterile technique was used. The patient's neck and upper chest were prepped using chlorhexidine / alcohol scrub and the field was draped in usual sterile fashion with full body drape. After the adequate sedation / anesthesia was achieved, attention was directed at the midline trachea, where the cricothyroid membrane was palpated. Approximately two fingerbreadths above the sternal notch, a verticle  incision was created with a scalpel after local infiltration with 0.2% Lidocaine. Noted a cut small calibar artery required 4 x sutures 3-0 silk , with hemostaisis at just below skin level. Then, using Seldinger technique and a percutaneous tracheostomy set, the trachea was entered with a 14 gauge needle with an overlying sheath. This was all confirmed under direct visualization of a fiberoptic flexible bronchoscope. Entrance into the trachea was identified through the third tracheal ring interspace. Following this, a guidewire was inserted. The needle was removed, leaving the sheath and the guidewire intact. Next, the sheath was removed and a small dilator was inserted. The tracheal rings were then dilated. A #8 Shiley was then opened. The balloon was checked. It was placed over a tracheal dilator, which was then advanced over the guidewire and through the previously dilated tract. The Shiley tracheostomy tube was noted to pass in the trachea with  little resistance. The guidewire and dilator tubes were removed from the trachea. An inner cannula was placed through the tracheostomy tube. The tracheostomy was then secured at the anterior neck with 4 monofilament sutures. The oral endotracheal tube was removed and the ventilator was attached to the newly placed tracheostomy tube. Adequate tidal volumes were noted. The cuff was inflated and no evidence of air leak was noted. No evidence of bleeding was noted. At this point, the procedure was concluded. Post-procedure chest x-ray was ordered.  Complications:  No immediate complications were noted.  Hemodynamic parameters and oxygenation remained stable throughout the procedure.  Estimated blood loss:  Less then 30 mL.  Nelda BucksFEINSTEIN,Izacc Demeyer J., MD Pulmonary and Critical Care Medicine [facility] Pager: [phone]  01/22/2016, 12:24 PM  Should follo wup in [facility] trach clinic [phone]

## 2016-01-22 NOTE — Procedures (Signed)
Bronchoscopy  for Percutaneous  Tracheostomy  Name: Henry Hardin MRN: [id number] DOB: 02/17/1986 Procedure: Bronchoscopy for Percutaneous Tracheostomy Indications: Diagnostic evaluation of the airways In conjunction with: Dr. Tyson AliasFeinstein   Procedure Details Consent: Risks of procedure as well as the alternatives and risks of each were explained to the (patient/caregiver).  Consent for procedure obtained. Time Out: Verified patient identification, verified procedure, site/side was marked, verified correct patient position, special equipment/implants available, medications/allergies/relevent history reviewed, required imaging and test results available.  Performed  In preparation for procedure, patient was given 100% FiO2 and bronchoscope lubricated. Sedation: Benzodiazepines, Muscle relaxants and Short-acting barbiturates  Airway entered and the following bronchi were examined: RML and LLL.   Procedures performed: Endotracheal Tube retracted in 2 cm increments. Cannulation of airway observed. Dilation observed. Placement of trachel tube  observed . No overt complications. Bronchoscope removed.    Evaluation Hemodynamic Status: BP stable throughout; O2 sats: stable throughout Patient's Current Condition: stable Specimens:  None Complications: No apparent complications Patient did tolerate procedure well.   Brett CanalesSteve Minor ACNP [facility] Pager [phone] till 3 pm If no answer page [phone] 01/22/2016, 10:57 AM   Supervised entire procedure Noted no injury to post wall. Trach wnl, nio bl;eeding  Mcarthur Rossettianiel J. Tyson AliasFeinstein, MD, FACP Pgr: [phone] [place] [facility]

## 2016-01-23 ENCOUNTER — Inpatient Hospital Stay (HOSPITAL_COMMUNITY): Payer: Self-pay

## 2016-01-23 DIAGNOSIS — E872 Acidosis: Secondary | ICD-10-CM

## 2016-01-23 DIAGNOSIS — J9602 Acute respiratory failure with hypercapnia: Secondary | ICD-10-CM

## 2016-01-23 LAB — CBC
HCT: 36.9 % — ABNORMAL LOW (ref 39.0–52.0)
HEMOGLOBIN: 11.9 g/dL — AB (ref 13.0–17.0)
MCH: 29.7 pg (ref 26.0–34.0)
MCHC: 32.2 g/dL (ref 30.0–36.0)
MCV: 92 fL (ref 78.0–100.0)
PLATELETS: 229 10*3/uL (ref 150–400)
RBC: 4.01 MIL/uL — AB (ref 4.22–5.81)
RDW: 14 % (ref 11.5–15.5)
WBC: 18.4 10*3/uL — AB (ref 4.0–10.5)

## 2016-01-23 LAB — GLUCOSE, CAPILLARY
GLUCOSE-CAPILLARY: 75 mg/dL (ref 65–99)
GLUCOSE-CAPILLARY: 177 mg/dL — AB (ref 65–99)
GLUCOSE-CAPILLARY: 145 mg/dL — AB (ref 65–99)
Glucose-Capillary: 117 mg/dL — ABNORMAL HIGH (ref 65–99)
Glucose-Capillary: 137 mg/dL — ABNORMAL HIGH (ref 65–99)
Glucose-Capillary: 144 mg/dL — ABNORMAL HIGH (ref 65–99)

## 2016-01-23 LAB — BASIC METABOLIC PANEL
ANION GAP: 8 (ref 5–15)
BUN: 28 mg/dL — ABNORMAL HIGH (ref 6–20)
CALCIUM: 8.7 mg/dL — AB (ref 8.9–10.3)
CO2: 30 mmol/L (ref 22–32)
CREATININE: 0.87 mg/dL (ref 0.61–1.24)
Chloride: 102 mmol/L (ref 101–111)
Glucose, Bld: 123 mg/dL — ABNORMAL HIGH (ref 65–99)
Potassium: 3.5 mmol/L (ref 3.5–5.1)
SODIUM: 140 mmol/L (ref 135–145)

## 2016-01-23 LAB — PROTIME-INR
INR: 1.12
PROTHROMBIN TIME: 14.5 s (ref 11.4–15.2)

## 2016-01-23 LAB — APTT: aPTT: 25 seconds (ref 24–36)

## 2016-01-23 MED ORDER — ACETAMINOPHEN 160 MG/5ML PO SOLN
650.0000 mg | Freq: Four times a day (QID) | ORAL | Status: DC | PRN
  Administered 2016-01-23: 650 mg
  Filled 2016-01-23: qty 20.3

## 2016-01-23 MED ORDER — LORAZEPAM 2 MG/ML IJ SOLN: 2.0000 mg | Freq: Four times a day (QID) | INTRAMUSCULAR | Status: DC | PRN

## 2016-01-23 MED ORDER — METHYLPREDNISOLONE SODIUM SUCC 40 MG IJ SOLR
30.0000 mg | Freq: Two times a day (BID) | INTRAMUSCULAR | Status: DC
  Administered 2016-01-23 – 2016-01-24 (×2): 30 mg via INTRAVENOUS
  Filled 2016-01-23 (×2): qty 1

## 2016-01-23 NOTE — Progress Notes (Addendum)
PULMONARY / CRITICAL CARE MEDICINE   Name: Henry Hardin MRN: [id number] DOB: 02/13/1986    ADMISSION DATE:  01/09/2016 CONSULTATION DATE:  01/10/2016  REFERRING MD:  Dr. Ethelda ChickJacubowitz  CHIEF COMPLAINT:  SOB  HISTORY OF PRESENT ILLNESS:   30 year old male with PMH asthma since childhood. He has never been consistent with taking his controller medications and has been unable to get albuterol due to insurance issues per mother. He has been smoking about a pack per day for the past 6 years during which time his symptoms have gotten worse. He has been seen in the ED 8 times in the past 6 months with exacerbations of asthma. He has been intubated once in the past. 11/22 he presented to the ED with CC respiratory distress for about 3 hours. He was treated with mag, albuterol, and atrovent by EMS. He was additionally treated with solumedrol, and additional bronchodilators in ED without improvement. His mental status began to worsen and he required intubation. PCCM asked to admit.   SUBJECTIVE: On full vent support  VITAL SIGNS: BP 126/68   Pulse (!) 110   Temp 98.7 F (37.1 C) (Axillary)   Resp 17   Ht 6\' 1"  (1.854 m)   Wt 157 lb 3 oz (71.3 kg)   SpO2 93%   BMI 20.74 kg/m   HEMODYNAMICS:    VENTILATOR SETTINGS: Vent Mode: PCV FiO2 (%):  [40 %-70 %] 40 % Set Rate:  [14 bmp-22 bmp] 14 bmp PEEP:  [5 cmH20] 5 cmH20 Plateau Pressure:  [7 cmH20-21 cmH20] 13 cmH20  INTAKE / OUTPUT: I/O last 3 completed shifts: In: 1978.8 [I.V.:776.8; NG/GT:702; IV Piggyback:500] Out: 2275 [Urine:2275]  PHYSICAL EXAMINATION: General:  Young adult male,eyes open,follows commands. On fentanyl drip which was converted to pushes 12/6 Neuro:  Sedated, RASS 0, follows commands HEENT:  [place]/AT, PERRL, no JVD,  New trach site CDI Cardiovascular:  s1s2 regular, no MRG Lungs:More natural breathing pattern. Decreased bases Abdomen:  Soft, non-tender, non-distended, + bs , cortrak ordered 12/6 Musculoskeletal:   No acute deformity or ROM limitation Skin:  Grossly intact  LABS:  BMET  Recent Labs Lab 01/21/16 0503 01/21/16 0900 01/22/16 0500  NA 137 137 138  K 4.0 4.6 3.9  CL 100* 99* 98*  CO2 30 29 31   BUN 32* 33* 29*  CREATININE 0.87 0.90 0.85  GLUCOSE 223* 224* 113*    Electrolytes  Recent Labs Lab 01/18/16 0427 01/19/16 0330 01/20/16 0425 01/21/16 0503 01/21/16 0900 01/22/16 0500  CALCIUM 8.0* 8.3* 8.4* 8.4* 8.4* 8.9  MG 2.2 2.0 2.1  --   --   --   PHOS  --   --  4.1  --   --   --     CBC  Recent Labs Lab 01/20/16 0425 01/21/16 0503 01/23/16 0644  WBC 22.7* 19.4* 18.4*  HGB 12.7* 12.1* 11.9*  HCT 38.6* 37.5* 36.9*  PLT 191 185 229    Coag's  Recent Labs Lab 01/21/16 0815 01/21/16 0900 01/23/16 0644  APTT 25 25 25   INR 1.10 1.10 1.12    Sepsis Markers No results for input(s): LATICACIDVEN, PROCALCITON, O2SATVEN in the last 168 hours.  ABG  Recent Labs Lab 01/19/16 0758  PHART 7.481*  PCO2ART 45.8  PO2ART 61.9*    Liver Enzymes  Recent Labs Lab 01/22/16 0500  AST 88*  ALT 126*  ALKPHOS 57  BILITOT 0.6  ALBUMIN 2.6*    Cardiac Enzymes No results for input(s): TROPONINI, PROBNP in  the last 168 hours.  Glucose  Recent Labs Lab 01/22/16 0818 01/22/16 1158 01/22/16 1613 01/22/16 2002 01/22/16 2341 01/23/16 0346  GLUCAP 184* 198* 137* 139* 203* 75    Imaging Dg Chest Port 1 View  Result Date: 01/22/2016 CLINICAL DATA:  Tracheostomy EXAM: PORTABLE CHEST 1 VIEW COMPARISON:  Earlier same day FINDINGS: Tracheostomy in place, grossly well positioned. No pneumothorax. Right arm PICC tip in the SVC 5 cm above the right atrium. Persistent left lower lobe atelectasis. IMPRESSION: Tracheostomy grossly well positioned without visible complication. Persistent left lower lobe collapse. Electronically Signed   By: Paulina FusiMark  Shogry M.D.   On: 01/22/2016 11:43     STUDIES:    CULTURES: MRSA (-) Trache asp 11/24 > (-) Blood 11/24  (-) Trach 11/26  > MSSA  ANTIBIOTICS: Azithro 11/24 > 11/26 Zosyn 11/26 >> 11/28 vanco x 1 11/28 Ancef 11/28 >>  SIGNIFICANT EVENTS: 11/22  intubated for asthma 11/24  air trapping 11/28  paralysis off 12/2 failed extubation 12/5 #8 shiley (DF)  LINES/TUBES: ETT 11/23 >>12/2 promptly failed and re intubated 12/2>>12/5 #8 Shiley 12/5>> RUE PICC 11/26 >>  DISCUSSION: 30 year old male smoker with asthma since childhood, poorly compliant r/t cost. Frequent visits/admissions.  Admitted 11/23 with status asthmaticus requiring intubation. Has required NMB, continues to have significant resp acidosis. Trached 12/5 and will convert care from acute to chronic  ASSESSMENT / PLAN:  Acute hypercarbic respiratory failure secondary to status asthmaticus:  No focal opacification on CXR.  MSSA Bronchitis, at risk evolving PNA Severe Resp acidosis(Resolved) Likely myopathy  trached 12/5 as he failed extubation  Intake/Output Summary (Last 24 hours) at 01/23/16 [id number] Last data filed at 01/23/16 [id number]  Gross per 24 hour  Intake            555.3 ml  Output             1400 ml  Net           -844.7 ml   Plan: Goal for negative balance daily obtained Solu-medrol reduced 12/6 Brovana + Pulmicort  Ordered cortrak 12/6  Acute metabolic encephalopathy At Risk Deconditioning  Suspect CIP  Plan: Propofol/fentanyl gtts stopped 12/6 Fet pushes and prn ativan along with Risperdal  RASS goal 0-1 Passive ROM  Ordered PT eval and OOB 12/6 Consider EMG, will d/w neuro if we have  Hypotension - resolved  Plan:  Tele Allow neg balance on own, BP wnl, tolerating  MSSA PNA Ancef for 10 days, stop date 12/8  Constipation At Risk Malnutrition  Continue Miralax  TF hold till cortrak placed Pepcid  Best practice VTE ppx: SCDs and SQ heparin Diet: NPO, TF for nutrition once cortrak placed   Family: none in room  CC Time:  30 minutes   Brett CanalesSteve Minor ACNP [facility] Pager [phone]  till 3 pm If no answer page [phone] 01/23/2016, 7:19 AM    STAFF NOTE: I, Rory Percyaniel Juniper Cobey, MD FACP have personally reviewed patient's available data, including medical history, events of note, physical examination and test results as part of my evaluation. I have discussed with resident/NP and other care providers such as pharmacist, RN and RRT. In addition, I personally evaluated patient and elicited key findings of: awake, fc now, coarse , wheezing reduced, prop to dc, fent to off as goal and to q6h, trach is clean/ dry, weaning to goal trach collar, goal 2-4 hours, full course 14 days for mssa given bronch findings and lack of progress, allow continued  neg balance, PT consult, to chair if able, I updated family in room, assess trach collar, iof faisl and needs PS then place cortrak, if successful then slp in am  The patient is critically ill with multiple organ systems failure and requires high complexity decision making for assessment and support, frequent evaluation and titration of therapies, application of advanced monitoring technologies and extensive interpretation of multiple databases.   Critical Care Time devoted to patient care services described in this note is 30 Minutes. This time reflects time of care of this signee: Rory Percy, MD FACP. This critical care time does not reflect procedure time, or teaching time or supervisory time of PA/NP/Med student/Med Resident etc but could involve care discussion time. Rest per NP/medical resident whose note is outlined above and that I agree with   Mcarthur Rossetti. Tyson Alias, MD, FACP Pgr: [phone]  Pulmonary & Critical Care 01/23/2016 11:13 AM

## 2016-01-23 NOTE — Progress Notes (Signed)
200 mL of Fentanyl wasted in sink. Witnessed by Juleen Starraryn Hutton, Rn.

## 2016-01-23 NOTE — Progress Notes (Signed)
Wife desiring to speak with financial counselor to discuss applying for Medicaid.  Notified financial counselor Archie Pattenonya who states she will call wife to set up an appointment with her.    Quintella BatonJulie W. Roschelle Calandra, RN, BSN  Trauma/Neuro ICU Case Manager [phone]

## 2016-01-23 NOTE — Progress Notes (Signed)
Hyu, RN made aware patient has an order for coretrak placement. Nursing to continue to monitor.

## 2016-01-23 NOTE — Significant Event (Signed)
Cortrak feeding tube placed via left nare-marked at 95cm. Patient tolerated procedure. RN left tube in stomach, did not advance for post pyloric, as patient's primary RN Marsh DollyMarianna stated that patient will be on trach collar and will be doing swallowing studies, so feeding tube will be removed then.

## 2016-01-24 LAB — GLUCOSE, CAPILLARY
GLUCOSE-CAPILLARY: 82 mg/dL (ref 65–99)
GLUCOSE-CAPILLARY: 149 mg/dL — AB (ref 65–99)
Glucose-Capillary: 160 mg/dL — ABNORMAL HIGH (ref 65–99)
Glucose-Capillary: 120 mg/dL — ABNORMAL HIGH (ref 65–99)
Glucose-Capillary: 141 mg/dL — ABNORMAL HIGH (ref 65–99)

## 2016-01-24 LAB — BASIC METABOLIC PANEL
ANION GAP: 8 (ref 5–15)
Anion gap: 11 (ref 5–15)
BUN: 28 mg/dL — AB (ref 6–20)
BUN: 26 mg/dL — ABNORMAL HIGH (ref 6–20)
CALCIUM: 8 mg/dL — AB (ref 8.9–10.3)
CALCIUM: 8.6 mg/dL — AB (ref 8.9–10.3)
CHLORIDE: 107 mmol/L (ref 101–111)
CO2: 23 mmol/L (ref 22–32)
CO2: 22 mmol/L (ref 22–32)
CREATININE: 0.67 mg/dL (ref 0.61–1.24)
CREATININE: 0.72 mg/dL (ref 0.61–1.24)
Chloride: 106 mmol/L (ref 101–111)
GFR calc Af Amer: >60 mL/min (ref >60)
GFR calc non Af Amer: >60 mL/min (ref >60)
GLUCOSE: 107 mg/dL — AB (ref 65–99)
Glucose, Bld: 149 mg/dL — ABNORMAL HIGH (ref 65–99)
Potassium: 2.7 mmol/L — CL (ref 3.5–5.1)
Potassium: 3.4 mmol/L — ABNORMAL LOW (ref 3.5–5.1)
SODIUM: 140 mmol/L (ref 135–145)
SODIUM: 137 mmol/L (ref 135–145)

## 2016-01-24 MED ORDER — PREDNISONE 5 MG/5ML PO SOLN
20.0000 mg | Freq: Two times a day (BID) | ORAL | Status: DC
Start: 2016-01-24 — End: 2016-01-25
  Filled 2016-01-24 (×3): qty 20

## 2016-01-24 MED ORDER — SODIUM CHLORIDE 0.9 % IV BOLUS (SEPSIS)
500.0000 mL | Freq: Once | INTRAVENOUS | Status: AC
  Administered 2016-01-24: 500 mL via INTRAVENOUS

## 2016-01-24 MED ORDER — POTASSIUM CHLORIDE 2 MEQ/ML IV SOLN
30.0000 meq | INTRAVENOUS | Status: AC
  Administered 2016-01-24 (×2): 30 meq via INTRAVENOUS
  Filled 2016-01-24 (×2): qty 15

## 2016-01-24 MED ORDER — LORAZEPAM 2 MG/ML IJ SOLN
0.5000 mg | Freq: Two times a day (BID) | INTRAMUSCULAR | Status: DC
  Administered 2016-01-24 (×2): 0.5 mg via INTRAVENOUS
  Filled 2016-01-24 (×2): qty 1

## 2016-01-24 NOTE — Progress Notes (Signed)
Pt trached... NIF and VC held.

## 2016-01-24 NOTE — Progress Notes (Signed)
Met with pt and mother this morning.  Pt much improved; able to text "have you spoken with speech therapy?" on his phone, with some effort.  PT/OT/ST  ordered; will follow for recommendations.    Reinaldo Raddle, RN, BSN  Trauma/Neuro ICU Case Manager [phone]

## 2016-01-24 NOTE — Progress Notes (Signed)
Nutrition Follow-up  INTERVENTION:   If diet advanced will supplement diet to meet increased nutirtion needs If needs enteral nutrition therapy recommend: Pivot 1.5 @ 65 ml/hr (1560 ml/day) Provides: 2340 kcal, 146 grams protein, and 1184 ml H2O.   NUTRITION DIAGNOSIS:   Inadequate oral intake related to inability to eat as evidenced by NPO status. Ongoing.   GOAL:   Patient will meet greater than or equal to 90% of their needs Not met.   MONITOR:   Diet advancement, I & O's  ASSESSMENT:   30 y/o male PMHx Asthma w/ poor Compliance and tobacco abuse. Presented to ED w/ respiratory distress. Eventually required intubation. RD consulted for TF.   12/5 trach and PEG placed, TF held 12/6 Cortrak in stomach for meds  Pt discussed during ICU rounds and with RN.  Has been on trach collar since 12/6 Will need FEES prior to diet initiation  Labs reviewed: K+ 2.7  Diet Order:  Diet NPO time specified  Skin:   (MASD to groin and buttocks)  Last BM:  12/7  Height:   Ht Readings from Last 1 Encounters:  01/09/16 6' 1" (1.854 m)    Weight:   Wt Readings from Last 1 Encounters:  01/24/16 154 lb 1.6 oz (69.9 kg)    Ideal Body Weight:  83.64 kg  BMI:  Body mass index is 20.33 kg/m.  Estimated Nutritional Needs:   Kcal:  2200-2400  Protein:  112-128 grams  Fluid:  > 2.2 l/day  EDUCATION NEEDS:   No education needs identified at this time    RD, LDN, CNSC [phone] Pager [phone] After Hours Pager  

## 2016-01-24 NOTE — Progress Notes (Signed)
Inpatient Rehabilitation  Per PT and SLP request patient was screened by Fae PippinMelissa Calvin Chura for appropriateness for an Inpatient Acute Rehab consult.  At this time we are recommending an Inpatient Rehab consult.  Please order if you are agreeable.    Charlane FerrettiMelissa Ayleah Hofmeister, M.A., CCC/SLP Admission Coordinator  [facility]  Cell [phone]

## 2016-01-24 NOTE — Evaluation (Signed)
Passy-Muir Speaking Valve - Evaluation Patient Details  Name: Henry Hardin L Eland MRN: [id number] Date of Birth: 05/06/1985  Today's Date: 01/24/2016 Time: [id number] SLP Time Calculation (min) (ACUTE ONLY): 35 min  Past Medical History:  Past Medical History:  Diagnosis Date  . Asthma   . Pneumonia    Past Surgical History: History reviewed. No pertinent surgical history. HPI:  30 year old with asthma since childhood. Per chart poorly compliant-history obtained from mother (7 ED visits in the last 6 months). Other PMH:  intubated 2 years ago, continues to smoke about a pack per day. Admitted with  asthma exacerbation and respiratory distress. Intubated 11/24 and trach'd 12/5; initiated TC 12/7.   Assessment / Plan / Recommendation Clinical Impression  Pt tolerated PMSV for approximately 30 min and achieved more of a whisper quality versus phonation using true vocal cords. Introduced strategies including deep inhalation, 1-2 words at a time for subsequent attempts. Pt able to return demonstrate and carry over with minmal verbal/demonstration cues. Pt frequently coughed valve off during initial 2-4 minutes but valve remained on trach hub remainder of evaluation. RR 14-19, HR 93-97 and SpO2 95-96%. A smaller size and cuffless trach may increase phonatory ability to allow more space for air toward vocal cords. Recommend pt wear valve during all waking hours, remove during sleep.        SLP Assessment  Patient needs continued Speech Lanaguage Pathology Services    Follow Up Recommendations  Inpatient Rehab    Frequency and Duration min 2x/week  2 weeks    PMSV Trial PMSV was placed for: 25 min Able to redirect subglottic air through upper airway: Yes Able to Attain Phonation: No (more whisper) Able to Expectorate Secretions: Yes Level of Secretion Expectoration with PMSV: Tracheal Breath Support for Phonation: Moderately decreased Intelligibility: Intelligibility reduced Word: 50-74%  accurate Phrase: 25-49% accurate Sentence: 0-24% accurate Respirations During Trial:  (14-19) SpO2 During Trial:  (95-96%) Pulse During Trial:  (93-97) Behavior: Alert;Controlled;Cooperative;Responsive to questions   Tracheostomy Tube       Vent Dependency  FiO2 (%): 35 %    Cuff Deflation Trial  GO Tolerated Cuff Deflation: Yes Length of Time for Cuff Deflation Trial: 35 min Behavior: Alert;Controlled;Cooperative;Good eye contact;Responsive to questions        Royce MacadamiaLitaker, Symphani Eckstrom Willis 01/24/2016, 1:50 PM   Breck CoonsLisa Willis Lonell FaceLitaker M.Ed [employer]-SLP Pager [phone]

## 2016-01-24 NOTE — Evaluation (Signed)
Clinical/Bedside Swallow Evaluation Patient Details  Name: Henry Hardin MRN: [id number] Date of Birth: 01/02/1986  Today's Date: 01/24/2016 Time: SLP Start Time (ACUTE ONLY): 1353 SLP Stop Time (ACUTE ONLY): 1414 SLP Time Calculation (min) (ACUTE ONLY): 21 min  Past Medical History:  Past Medical History:  Diagnosis Date  . Asthma   . Pneumonia    Past Surgical History: History reviewed. No pertinent surgical history. HPI:  30 year old with asthma since childhood. Per chart poorly compliant-history obtained from mother (7 ED visits in the last 6 months). Other PMH:  intubated 2 years ago, continues to smoke about a pack per day. Admitted with  asthma exacerbation and respiratory distress. Intubated 11/24 and trach'd 12/5; initiated TC 12/7.   Assessment / Plan / Recommendation Clinical Impression  Subtle signs of likely intubation induced pharyngeal dysphagia with great prognosis for return to regular texture and thin liquid suspected delayed initiation and consistent delayed cough following water. Given approximately 11 day intubation, overall deconditioning recommend objective assessment with FEES prior to po clearance.      Aspiration Risk  Moderate aspiration risk    Diet Recommendation Ice chips PRN after oral care   Medication Administration: Via alternative means    Other  Recommendations Oral Care Recommendations: Oral care QID   Follow up Recommendations Inpatient Rehab      Frequency and Duration min 2x/week  2 weeks       Prognosis        Swallow Study   General HPI: 30 year old with asthma since childhood. Per chart poorly compliant-history obtained from mother (7 ED visits in the last 6 months). Other PMH:  intubated 2 years ago, continues to smoke about a pack per day. Admitted with  asthma exacerbation and respiratory distress. Intubated 11/24 and trach'd 12/5; initiated TC 12/7. Type of Study: Bedside Swallow Evaluation Previous Swallow Assessment:   (none) Diet Prior to this Study: NPO Temperature Spikes Noted: No Respiratory Status: Trach;Trach Collar Trach Size and Type: Cuff;#8;Deflated;With PMSV in place History of Recent Intubation: Yes Length of Intubations (days): 11 days Date extubated:  (trach 12/5) Behavior/Cognition: Alert;Cooperative;Requires cueing Oral Cavity Assessment: Within Functional Limits Oral Care Completed by SLP: No Oral Cavity - Dentition: Adequate natural dentition Vision: Functional for self-feeding Self-Feeding Abilities: Able to feed self;Needs assist Patient Positioning: Upright in chair Baseline Vocal Quality:  (mostly whisper) Volitional Cough: Strong Volitional Swallow: Able to elicit    Oral/Motor/Sensory Function Overall Oral Motor/Sensory Function: Generalized oral weakness   Ice Chips Ice chips: Within functional limits Presentation: Spoon   Thin Liquid Thin Liquid: Impaired Presentation: Cup;Spoon Oral Phase Impairments: Reduced labial seal Oral Phase Functional Implications:  (anterior spill) Pharyngeal  Phase Impairments: Suspected delayed Swallow;Cough - Delayed (weak reflexive)    Nectar Thick Nectar Thick Liquid: Not tested   Honey Thick Honey Thick Liquid: Not tested   Puree Puree: Impaired Presentation: Spoon Pharyngeal Phase Impairments: Suspected delayed Swallow   Solid   GO   Solid: Not tested        Royce MacadamiaLitaker, Shilynn Hoch Willis 01/24/2016,3:10 PM  Breck CoonsLisa Willis Lonell FaceLitaker M.Ed [employer]-SLP Pager [phone]

## 2016-01-24 NOTE — Progress Notes (Signed)
CRITICAL VALUE ALERT  Critical value received:  K 2.7  Date of notification:  01/24/16  Time of notification:  0620  Critical value read back:Yes.    Nurse who received alert: Dorthula MatasKendra Chaquetta Schlottman, RN  MD notified (1st page):  eLink RN  Time of first page:  0622  MD notified (2nd page):  Time of second page:  Responding MD:  Arsenio LoaderSommer  Time MD responded:  [phone]

## 2016-01-24 NOTE — Evaluation (Signed)
Physical Therapy Evaluation Patient Details Name: Henry Hardin MRN: [id number] DOB: 04/01/1985 Today's Date: 01/24/2016   History of Present Illness  Patient is a 30 y/o male with hx of asthma, who has never been consistent with taking his controller medications and has been unable to get albuterol due to insurance issues as well as a smoker (1 pack per day) presents with asthma exacerbation and went into respiratory distress s/p trach.  Clinical Impression  Patient presents with deconditioning, generalized weakness, impaired sitting/standing balance and impaired mobility s/p above. Pt has not been OOB for 2 weeks. Pt able to nod appropriately to questions asked and mouth words. Requires Max A of 2 for standing and SPT to chair due to bil knee buckling and poor trunk activation. Encouraged OOB to chair daily. Would benefit from CIR to maximize independence and mobility prior to return home. Will follow acutely.    Follow Up Recommendations CIR    Equipment Recommendations  Other (comment) (TBA)    Recommendations for Other Services OT consult;Rehab consult     Precautions / Restrictions Precautions Precautions: Fall Precaution Comments: trach Restrictions Weight Bearing Restrictions: No      Mobility  Bed Mobility Overal bed mobility: Needs Assistance Bed Mobility: Supine to Sit     Supine to sit: Mod assist;+2 for safety/equipment;HOB elevated     General bed mobility comments: Assist to elevate trunk and scoot bottom to EOB. Able to bring LEs to EOb. No dizziness.  Transfers Overall transfer level: Needs assistance Equipment used: 2 person hand held assist Transfers: Sit to/from [employer]Stand;Stand Pivot Transfers Sit to Stand: Max assist;+2 physical assistance Stand pivot transfers: Max assist;+2 physical assistance       General transfer comment: Assist of 2 to power to standing with therapist stabilizing bil knees due to buckling. Cues for upright posture as pt with poor  trunk activation. SPT Max A of 2.   Ambulation/Gait                Stairs            Wheelchair Mobility    Modified Rankin (Stroke Patients Only)       Balance Overall balance assessment: Needs assistance Sitting-balance support: Feet supported;Bilateral upper extremity supported Sitting balance-Leahy Scale: Poor Sitting balance - Comments: Varies from min A-Max A with cues for upright posture and cervical extension. Fatigues easily and not able to maintain upright without cues/assist. Postural control: Posterior lean Standing balance support: During functional activity Standing balance-Leahy Scale: Zero                               Pertinent Vitals/Pain Pain Assessment: No/denies pain    Home Living Family/patient expects to be discharged to:: Private residence Living Arrangements: Spouse/significant other;Children Available Help at Discharge: Family Type of Home: House Home Access: Stairs to enter Entrance Stairs-Rails: Right Entrance Stairs-Number of Steps: 3 Home Layout: Two level Home Equipment: None      Prior Function Level of Independence: Independent               Hand Dominance   Dominant Hand: Right    Extremity/Trunk Assessment   Upper Extremity Assessment: Defer to OT evaluation;Generalized weakness           Lower Extremity Assessment: Generalized weakness      Cervical / Trunk Assessment: Other exceptions  Communication   Communication: Tracheostomy  Cognition Arousal/Alertness: Awake/alert Behavior During Therapy: [facility] Surgery Center LLCWFL for  tasks assessed/performed Overall Cognitive Status: Difficult to assess (Seems WFL.)                      General Comments General comments (skin integrity, edema, etc.): Mother present but stepped out of room during session.    Exercises     Assessment/Plan    PT Assessment Patient needs continued PT services  PT Problem List Decreased strength;Decreased  mobility;Decreased range of motion;Decreased activity tolerance;Cardiopulmonary status limiting activity;Decreased balance;Decreased knowledge of use of DME          PT Treatment Interventions DME instruction;Therapeutic activities;Gait training;Therapeutic exercise;Patient/family education;Balance training;Functional mobility training;Neuromuscular re-education;Stair training    PT Goals (Current goals can be found in the Care Plan section)  Acute Rehab PT Goals Patient Stated Goal: to get out of bed PT Goal Formulation: With patient Time For Goal Achievement: 02/07/16 Potential to Achieve Goals: Good    Frequency Min 3X/week   Barriers to discharge Inaccessible home environment stairs; need to see level of support pt has from family    Co-evaluation               End of Session Equipment Utilized During Treatment: Gait belt;Oxygen (trach) Activity Tolerance: Patient tolerated treatment well Patient left: in chair;with call bell/phone within reach;with family/visitor present Nurse Communication: Mobility status;Need for lift equipment         Time: [id number] PT Time Calculation (min) (ACUTE ONLY): 23 min   Charges:   PT Evaluation $PT Eval Moderate Complexity: 1 Procedure PT Treatments $Therapeutic Activity: 8-22 mins   PT G Codes:        Yassen Kinnett A Farhad Burleson 01/24/2016, 1:51 PM Mylo RedShauna Quaran Kedzierski, PT, DPT [phone]

## 2016-01-24 NOTE — Progress Notes (Signed)
PULMONARY / CRITICAL CARE MEDICINE   Name: Henry Hardin MRN: [id number] DOB: 12/18/1985    ADMISSION DATE:  01/09/2016 CONSULTATION DATE:  01/10/2016  REFERRING MD:  Dr. Ethelda ChickJacubowitz  CHIEF COMPLAINT:  SOB  HISTORY OF PRESENT ILLNESS:   30 year old male with PMH asthma since childhood. He has never been consistent with taking his controller medications and has been unable to get albuterol due to insurance issues per mother. He has been smoking about a pack per day for the past 6 years during which time his symptoms have gotten worse. He has been seen in the ED 8 times in the past 6 months with exacerbations of asthma. He has been intubated once in the past. 11/22 he presented to the ED with CC respiratory distress for about 3 hours. He was treated with mag, albuterol, and atrovent by EMS. He was additionally treated with solumedrol, and additional bronchodilators in ED without improvement. His mental status began to worsen and he required intubation. PCCM asked to admit.   SUBJECTIV Off vent 24 hours\ Increased strength hands  VITAL SIGNS: BP 124/79   Pulse 80   Temp 98.5 F (36.9 C) (Axillary)   Resp (!) 22   Ht 6\' 1"  (1.854 m)   Wt 69.9 kg (154 lb 1.6 oz)   SpO2 98%   BMI 20.33 kg/m   HEMODYNAMICS:    VENTILATOR SETTINGS: Vent Mode: Stand-by FiO2 (%):  [35 %-40 %] 35 % Set Rate:  [14 bmp] 14 bmp PEEP:  [5 cmH20] 5 cmH20 Pressure Support:  [20 cmH20] 20 cmH20 Plateau Pressure:  [12 cmH20] 12 cmH20  INTAKE / OUTPUT: I/O last 3 completed shifts: In: 995 [I.V.:140; Other:90; IV Piggyback:765] Out: 1850 [Urine:1850]  PHYSICAL EXAMINATION: General:  Young adult male,eyes open,follows commands. Neuro:  Sedated, RASS 0, follows commands, strength in hands increased, min leg movement HEENT:  Trach wnl Cardiovascular:  s1s2 regular, no MRG Lungs:good cough, ronchi Abdomen:  Soft, non-tender, non-distended, + bs , cortrak Musculoskeletal:  No acute deformity or ROM  limitation Skin:  Grossly intact  LABS:  BMET  Recent Labs Lab 01/22/16 0500 01/23/16 0644 01/24/16 0554  NA 138 140 140  K 3.9 3.5 2.7*  CL 98* 102 106  CO2 31 30 23   BUN 29* 28* 28*  CREATININE 0.85 0.87 0.67  GLUCOSE 113* 123* 107*    Electrolytes  Recent Labs Lab 01/18/16 0427 01/19/16 0330 01/20/16 0425  01/22/16 0500 01/23/16 0644 01/24/16 0554  CALCIUM 8.0* 8.3* 8.4*  < > 8.9 8.7* 8.0*  MG 2.2 2.0 2.1  --   --   --   --   PHOS  --   --  4.1  --   --   --   --   < > = values in this interval not displayed.  CBC  Recent Labs Lab 01/20/16 0425 01/21/16 0503 01/23/16 0644  WBC 22.7* 19.4* 18.4*  HGB 12.7* 12.1* 11.9*  HCT 38.6* 37.5* 36.9*  PLT 191 185 229    Coag's  Recent Labs Lab 01/21/16 0815 01/21/16 0900 01/23/16 0644  APTT 25 25 25   INR 1.10 1.10 1.12    Sepsis Markers No results for input(s): LATICACIDVEN, PROCALCITON, O2SATVEN in the last 168 hours.  ABG  Recent Labs Lab 01/19/16 0758  PHART 7.481*  PCO2ART 45.8  PO2ART 61.9*    Liver Enzymes  Recent Labs Lab 01/22/16 0500  AST 88*  ALT 126*  ALKPHOS 57  BILITOT 0.6  ALBUMIN 2.6*  Cardiac Enzymes No results for input(s): TROPONINI, PROBNP in the last 168 hours.  Glucose  Recent Labs Lab 01/23/16 1202 01/23/16 1540 01/23/16 1952 01/23/16 2328 01/24/16 0346 01/24/16 0822  GLUCAP 145* 117* 137* 144* 82 149*    Imaging Dg Abd Portable 1v  Result Date: 01/23/2016 CLINICAL DATA:  Feeding tube placement EXAM: PORTABLE ABDOMEN - 1 VIEW COMPARISON:  01/19/2016 FINDINGS: Feeding tube coils in the stomach with the tip in the fundus. Mild diffuse gaseous distention of bowel. Mild gaseous distention of the stomach. IMPRESSION: Feeding tube coils in the stomach with the tip in the fundus. Electronically Signed   By: Charlett NoseKevin  Dover M.D.   On: 01/23/2016 14:20     STUDIES:    CULTURES: MRSA (-) Trache asp 11/24 > (-) Blood 11/24 (-) Trach 11/26  >  MSSA  ANTIBIOTICS: Azithro 11/24 > 11/26 Zosyn 11/26 >> 11/28 vanco x 1 11/28 Ancef 11/28 >>  SIGNIFICANT EVENTS: 11/22  intubated for asthma 11/24  air trapping 11/28  paralysis off 12/2 failed extubation 12/5 #8 shiley (DF)  LINES/TUBES: ETT 11/23 >>12/2 promptly failed and re intubated 12/2>>12/5 #8 Shiley 12/5>> RUE PICC 11/26 >>  DISCUSSION: 30 year old male smoker with asthma since childhood, poorly compliant r/t cost. Frequent visits/admissions.  Admitted 11/23 with status asthmaticus requiring intubation. Has required NMB, continues to have significant resp acidosis. Trached 12/5 and will convert care from acute to chronic  ASSESSMENT / PLAN:  Acute hypercarbic respiratory failure secondary to status asthmaticus:  No focal opacification on CXR.  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   MSSA PNA Ancef for 14 days, stop date in place  Constipation At Risk Malnutrition  Continue Miralax  TF held, getting SLP Pepcid  Best practice VTE ppx: SCDs and SQ heparin Diet: NPO, TF for nutrition once cortrak placed   Family: none in room  Await SDU bed day 3! To reg sdu now  Mcarthur RossettiDaniel J. Tyson AliasFeinstein, MD, FACP Pgr: [phone] [facility] 01/24/2016 11:23 AM

## 2016-01-24 NOTE — Progress Notes (Signed)
Cortrak became dislodged. PCCM MD aware and okay'd to pull. Cortrak pulled at 1050 with no complications. Patient tolerated well.

## 2016-01-24 NOTE — Progress Notes (Signed)
[facility] FOR [facility] LAB REPLACEMENT ONLY  The patient does apply for the [facility] Electrolyte Replacment Protocol based on the criteria listed below:   1. Is GFR >/= 40 ml/min? Yes.    Patient's GFR today is >60 2. Is urine output >/= 0.5 ml/kg/hr for the last 6 hours? Yes.   Patient's UOP is 1.19 ml/kg/hr 3. Is BUN < 60 mg/dL? Yes.    Patient's BUN today is 28 4. Abnormal electrolyte  K 2.7 5. Ordered repletion with: per protocol 6. If a panic level lab has been reported, has the CCM MD in charge been notified? Yes.  .   Physician:  Henry BranchSommer  Zhyon Hardin, Lang Snowlizabeth McEachran 01/24/2016 6:25 AM

## 2016-01-24 NOTE — Progress Notes (Signed)
eLink Physician-Brief Progress Note Patient Name: Henry Hardin DOB: 04/06/1985 MRN: [id number]   Date of Service  01/24/2016  HPI/Events of Note  Still has continuous neb ordered but not receiving   eICU Interventions  D/c continuous alb/ continue duoneb and prn alb     Intervention Category Major Interventions: Respiratory failure - evaluation and management  Sandrea HughsMichael Alverda Nazzaro 01/24/2016, 7:58 PM

## 2016-01-25 ENCOUNTER — Encounter (HOSPITAL_COMMUNITY): Payer: Self-pay | Admitting: Physical Medicine and Rehabilitation

## 2016-01-25 DIAGNOSIS — J45902 Unspecified asthma with status asthmaticus: Principal | ICD-10-CM

## 2016-01-25 DIAGNOSIS — E876 Hypokalemia: Secondary | ICD-10-CM

## 2016-01-25 DIAGNOSIS — G7281 Critical illness myopathy: Secondary | ICD-10-CM

## 2016-01-25 LAB — BASIC METABOLIC PANEL
ANION GAP: 10 (ref 5–15)
BUN: 24 mg/dL — AB (ref 6–20)
CHLORIDE: 104 mmol/L (ref 101–111)
CO2: 25 mmol/L (ref 22–32)
Calcium: 8.7 mg/dL — ABNORMAL LOW (ref 8.9–10.3)
Creatinine, Ser: 0.68 mg/dL (ref 0.61–1.24)
Glucose, Bld: 96 mg/dL (ref 65–99)
POTASSIUM: 3.1 mmol/L — AB (ref 3.5–5.1)
SODIUM: 139 mmol/L (ref 135–145)

## 2016-01-25 LAB — GLUCOSE, CAPILLARY
GLUCOSE-CAPILLARY: 123 mg/dL — AB (ref 65–99)
GLUCOSE-CAPILLARY: 131 mg/dL — AB (ref 65–99)
GLUCOSE-CAPILLARY: 135 mg/dL — AB (ref 65–99)
GLUCOSE-CAPILLARY: 189 mg/dL — AB (ref 65–99)
GLUCOSE-CAPILLARY: 92 mg/dL (ref 65–99)
Glucose-Capillary: 118 mg/dL — ABNORMAL HIGH (ref 65–99)

## 2016-01-25 LAB — TRIGLYCERIDES: TRIGLYCERIDES: 94 mg/dL (ref <150)

## 2016-01-25 MED ORDER — POTASSIUM CHLORIDE CRYS ER 20 MEQ PO TBCR
40.0000 meq | EXTENDED_RELEASE_TABLET | ORAL | Status: AC
  Administered 2016-01-25 (×2): 40 meq via ORAL
  Filled 2016-01-25 (×2): qty 2

## 2016-01-25 MED ORDER — PANTOPRAZOLE SODIUM 40 MG PO TBEC
40.0000 mg | DELAYED_RELEASE_TABLET | Freq: Every day | ORAL | Status: DC
  Administered 2016-01-25 – 2016-01-29 (×5): 40 mg via ORAL
  Filled 2016-01-25 (×5): qty 1

## 2016-01-25 MED ORDER — PREDNISONE 20 MG PO TABS
40.0000 mg | ORAL_TABLET | Freq: Every day | ORAL | Status: DC
  Administered 2016-01-25 – 2016-01-28 (×4): 40 mg via ORAL
  Filled 2016-01-25 (×4): qty 2

## 2016-01-25 NOTE — Evaluation (Signed)
Occupational Therapy Evaluation Patient Details Name: Enzo BiReegius L Yambao MRN: [id number] DOB: 10/27/1985 Today's Date: 01/25/2016    History of Present Illness Patient is a 30 y/o male with hx of asthma, who has never been consistent with taking his controller medications and has been unable to get albuterol due to insurance issues as well as a smoker (1 pack per day) presents with asthma exacerbation and went into respiratory distress s/p trach.   Clinical Impression   Pt with decline in function and safety with ADLs and ADL mobility with decreased strength, balance, endurance and ROM. Pt requires extensive assist with ADLs and requires mod A for balance/support sitting EOB during dynamic tasks. Pt would benefit from acute OT services to address impairments to increase level of function and safety    Follow Up Recommendations  CIR    Equipment Recommendations  Other (comment) (TBD at next venue of care)    Recommendations for Other Services       Precautions / Restrictions Precautions Precautions: Fall Precaution Comments: trach Restrictions Weight Bearing Restrictions: No      Mobility Bed Mobility Overal bed mobility: Needs Assistance Bed Mobility: Supine to Sit;Sit to Supine     Supine to sit: Min assist;HOB elevated Sit to supine: Mod assist;+2 for physical assistance;+2 for safety/equipment   General bed mobility comments: Pt required min A with trunk to achieve sitting EOB; mod A x2 with trunk and bilateral LEs to return to supine  Transfers Overall transfer level: Needs assistance Equipment used: 2 person hand held assist Transfers: Sit to/from Stand Sit to Stand: Mod assist;+2 physical assistance Stand pivot transfers: Max assist;+2 physical assistance       General transfer comment: Mod A x2 to power to standing with therapist stabilizing bil knees due to buckling. Cues for upright posture as pt with poor trunk activation.     Balance Overall balance  assessment: Needs assistance Sitting-balance support: Feet supported;No upper extremity supported Sitting balance-Leahy Scale: Poor Sitting balance - Comments: pt tolerated sitting EOB without UE supports; however, could not tolerate any perturbations or dynamic balance challenges without mod A for balance/support. Sat EOB ~ 5 minutes  Postural control: Posterior lean Standing balance support: During functional activity;Bilateral upper extremity supported Standing balance-Leahy Scale: Poor Standing balance comment: pt required mod A x2 to maintain upright standing                            ADL Overall ADL's : Needs assistance/impaired     Grooming: Wash/dry hands;Wash/dry face;Minimal assistance;Sitting   Upper Body Bathing: Maximal assistance Upper Body Bathing Details (indicate cue type and reason): mod A for balance/support at EOB Lower Body Bathing: Total assistance   Upper Body Dressing : Maximal assistance Upper Body Dressing Details (indicate cue type and reason): mod A for balance/support at EOB Lower Body Dressing: Total assistance   Toilet Transfer: Moderate assistance;Maximal assistance;+2 for physical assistance;+2 for safety/equipment;Stand-pivot   Toileting- Clothing Manipulation and Hygiene: Total assistance       Functional mobility during ADLs: Moderate assistance;Maximal assistance;+2 for physical assistance;+2 for safety/equipment General ADL Comments:  Poor dynmamic sitting balance due to trunk/UB weakness. Poor cervical muscle strength to keep head/neck upright. Sat EOB ~ 5 minutes with mod A     Vision Vision Assessment?: No apparent visual deficits              Pertinent Vitals/Pain Pain Assessment: No/denies pain Faces Pain Scale: No hurt Pain  Intervention(s): Monitored during session     Hand Dominance Right   Extremity/Trunk Assessment Upper Extremity Assessment Upper Extremity Assessment: Generalized weakness   Lower  Extremity Assessment Lower Extremity Assessment: Defer to PT evaluation   Cervical / Trunk Assessment Cervical / Trunk Exceptions: Weak cervical musculature and trunk   Communication Communication Communication: Tracheostomy   Cognition Arousal/Alertness: Awake/alert Behavior During Therapy: WFL for tasks assessed/performed Overall Cognitive Status: Within Functional Limits for tasks assessed                     General Comments   pt pleasant and cooperative    Exercises   Other Exercises Other Exercises: B UE ROM in flexion, extension, shoulder elevation         Home Living Family/patient expects to be discharged to:: Private residence Living Arrangements: Spouse/significant other;Children Available Help at Discharge: Family Type of Home: House Home Access: Stairs to enter Secretary/administratorntrance Stairs-Number of Steps: 3 Entrance Stairs-Rails: Right Home Layout: Two level Alternate Level Stairs-Number of Steps: 1 flight   Bathroom Shower/Tub: Chief Strategy OfficerTub/shower unit   Bathroom Toilet: Standard     Home Equipment: None          Prior Functioning/Environment Level of Independence: Independent                 OT Problem List: Decreased strength;Impaired balance (sitting and/or standing);Decreased range of motion;Decreased activity tolerance;Decreased coordination;Decreased knowledge of use of DME or AE;Impaired UE functional use   OT Treatment/Interventions: Self-care/ADL training;DME and/or AE instruction;Therapeutic activities;Balance training;Therapeutic exercise;Neuromuscular education;Patient/family education    OT Goals(Current goals can be found in the care plan section) Acute Rehab OT Goals Patient Stated Goal: to go to rehab OT Goal Formulation: With patient Time For Goal Achievement: 02/01/16 Potential to Achieve Goals: Good ADL Goals Pt Will Perform Grooming: with min guard assist;sitting Pt Will Perform Upper Body Bathing: with mod assist;sitting Pt Will  Perform Lower Body Bathing: with max assist;with mod assist;sitting/lateral leans Pt Will Perform Upper Body Dressing: with mod assist;sitting Pt Will Transfer to Toilet: with mod assist;bedside commode;stand pivot transfer Additional ADL Goal #1: Pt will tolerate B UE strenghtening exercises with level 2 theraband Additional ADL Goal #2: Pt will sit EOB x 8 minutes with min A - min guard A for balance/support for grooming and UB ADL tasks  OT Frequency: Min 2X/week   Barriers to D/C: Decreased caregiver support          Co-evaluation PT/OT/SLP Co-Evaluation/Treatment: Yes Reason for Co-Treatment: For patient/therapist safety;To address functional/ADL transfers PT goals addressed during session: Mobility/safety with mobility;Balance;Strengthening/ROM OT goals addressed during session: ADL's and self-care;Strengthening/ROM      End of Session Equipment Utilized During Treatment: Gait belt  Activity Tolerance: Patient limited by fatigue Patient left: in bed;with call bell/phone within reach   Time: [id number] OT Time Calculation (min): 25 min Charges:  OT General Charges $OT Visit: 1 Procedure OT Evaluation $OT Eval Moderate Complexity: 1 Procedure G-Codes:    Galen ManilaSpencer, Kamani Lewter Jeanette 01/25/2016, 12:09 PM

## 2016-01-25 NOTE — Procedures (Signed)
Objective Swallowing Evaluation: Type of Study: FEES-Fiberoptic Endoscopic Evaluation of Swallow  Patient Details  Name: Henry Hardin MRN: [id number] Date of Birth: 08/30/1985  Today's Date: 01/25/2016 Time: SLP Start Time (ACUTE ONLY): 1020-SLP Stop Time (ACUTE ONLY): 1050 SLP Time Calculation (min) (ACUTE ONLY): 30 min  Past Medical History:  Past Medical History:  Diagnosis Date  . Asthma   . Pneumonia    Past Surgical History: History reviewed. No pertinent surgical history. HPI: 30 year old with asthma since childhood. Per chart poorly compliant-history obtained from mother (7 ED visits in the last 6 months). Other PMH:  intubated 2 years ago, continues to smoke about a pack per day. Admitted with  asthma exacerbation and respiratory distress. Intubated 11/24 and trach'd 12/5 (extubated and reintubated immediately after); initiated TC 12/7.  No Data Recorded   Assessment / Plan / Recommendation  CHL IP CLINICAL IMPRESSIONS 01/25/2016  Therapy Diagnosis Mild pharyngeal phase dysphagia  Clinical Impression Pt's FEES completed wearing Passy-Muir speaking valve. Yesterday vocal quality was at a whisper; today phonating with adequate intensity and clear quality. Min-mild sensory based pharyngeal dysphagia resulting in swallow initiating at level of valleculae with thin. No larygneal penetration, aspiration or pharyngeal residue observed. Oral phase within functional limits. Recommend regular texture, thin liquids, straws allowed, pills with thin and wear speaking valve during all meals/snacks/meds. ST will continue dysphagia intervention for safety and efficiency.   Impact on safety and function Mild aspiration risk      CHL IP TREATMENT RECOMMENDATION 01/25/2016  Treatment Recommendations Therapy as outlined in treatment plan below     Prognosis 01/25/2016  Prognosis for Safe Diet Advancement Good  Barriers to Reach Goals --  Barriers/Prognosis Comment --    CHL IP DIET  RECOMMENDATION 01/25/2016  SLP Diet Recommendations Regular solids;Thin liquid  Liquid Administration via Cup;Straw  Medication Administration Whole meds with liquid  Compensations Slow rate;Small sips/bites  Postural Changes Seated upright at 90 degrees      CHL IP OTHER RECOMMENDATIONS 01/25/2016  Recommended Consults --  Oral Care Recommendations Oral care BID  Other Recommendations --      CHL IP FOLLOW UP RECOMMENDATIONS 01/25/2016  Follow up Recommendations Inpatient Rehab      CHL IP FREQUENCY AND DURATION 01/25/2016  Speech Therapy Frequency (ACUTE ONLY) min 1 x/week  Treatment Duration 2 weeks           CHL IP ORAL PHASE 01/25/2016  Oral Phase WFL  Oral - Pudding Teaspoon --  Oral - Pudding Cup --  Oral - Honey Teaspoon --  Oral - Honey Cup --  Oral - Nectar Teaspoon --  Oral - Nectar Cup --  Oral - Nectar Straw --  Oral - Thin Teaspoon --  Oral - Thin Cup --  Oral - Thin Straw --  Oral - Puree --  Oral - Mech Soft --  Oral - Regular --  Oral - Multi-Consistency --  Oral - Pill --  Oral Phase - Comment --    CHL IP PHARYNGEAL PHASE 01/25/2016  Pharyngeal Phase Impaired  Pharyngeal- Pudding Teaspoon --  Pharyngeal --  Pharyngeal- Pudding Cup --  Pharyngeal --  Pharyngeal- Honey Teaspoon --  Pharyngeal --  Pharyngeal- Honey Cup --  Pharyngeal --  Pharyngeal- Nectar Teaspoon --  Pharyngeal --  Pharyngeal- Nectar Cup --  Pharyngeal --  Pharyngeal- Nectar Straw --  Pharyngeal --  Pharyngeal- Thin Teaspoon --  Pharyngeal --  Pharyngeal- Thin Cup Delayed swallow initiation-vallecula  Pharyngeal --  Pharyngeal- Thin  Straw Delayed swallow initiation-vallecula  Pharyngeal --  Pharyngeal- Puree --  Pharyngeal --  Pharyngeal- Mechanical Soft --  Pharyngeal --  Pharyngeal- Regular Delayed swallow initiation-vallecula  Pharyngeal --  Pharyngeal- Multi-consistency --  Pharyngeal --  Pharyngeal- Pill --  Pharyngeal --  Pharyngeal Comment --     CHL IP  CERVICAL ESOPHAGEAL PHASE 01/25/2016  Cervical Esophageal Phase WFL  Pudding Teaspoon --  Pudding Cup --  Honey Teaspoon --  Honey Cup --  Nectar Teaspoon --  Nectar Cup --  Nectar Straw --  Thin Teaspoon --  Thin Cup --  Thin Straw --  Puree --  Mechanical Soft --  Regular --  Multi-consistency --  Pill --  Cervical Esophageal Comment --    No flowsheet data found.  Royce MacadamiaLitaker, Anyjah Roundtree Willis 01/25/2016, 2:30 PM   Breck CoonsLisa Willis Lonell FaceLitaker M.Ed [employer]-SLP Pager [phone]

## 2016-01-25 NOTE — Progress Notes (Signed)
Inpatient Rehabilitation  Met with patient and fiance to discuss team's recommendation for IP Rehab.  Shared booklets and answered questions.  Patient reported being eager to get to IP Rehab to regain his independence.  Plan to follow along for timing of medical stability and bed availability.  Please call with questions.   Carmelia Roller., CCC/SLP Admission Coordinator  [place]  Cell [phone]

## 2016-01-25 NOTE — Progress Notes (Addendum)
Physical Therapy Treatment Patient Details Name: Henry Hardin MRN: [id number] DOB: 03/11/1985 Today's Date: 01/25/2016    History of Present Illness Patient is a 30 y/o male with hx of asthma, who has never been consistent with taking his controller medications and has been unable to get albuterol due to insurance issues as well as a smoker (1 pack per day) presents with asthma exacerbation and went into respiratory distress s/p trach.    PT Comments    Pt presented supine in bed with HOB elevated, awake and willing to participate in therapy session. Pt making slow progress, requiring less physical assist than previous session; however, still needing mod A x2 for sit-to-stand and to maintain balance in standing. Pt continuing to be limited by fatigue with tremors in bilateral UEs with all movement. At end of session, PT demonstrated and instructed pt in bilateral LE therapeutic exercises including heelslides, hip abduction/adduction, SLR and ankle pumps.   Pt would continue to benefit from skilled physical therapy services at this time while admitted and after d/c to address his limitations in order to improve his overall safety and independence with functional mobility. Pt continues to be an excellent candidate for CIR when medically stable.   Follow Up Recommendations  CIR     Equipment Recommendations  Other (comment) (defer to next venue)    Recommendations for Other Services OT consult;Rehab consult     Precautions / Restrictions Precautions Precautions: Fall Precaution Comments: trach Restrictions Weight Bearing Restrictions: No    Mobility  Bed Mobility Overal bed mobility: Needs Assistance Bed Mobility: Supine to Sit;Sit to Supine     Supine to sit: Min assist;HOB elevated Sit to supine: Mod assist;+2 for physical assistance;+2 for safety/equipment   General bed mobility comments: Pt required min A with trunk to achieve sitting EOB; mod A x2 with trunk and bilateral  LEs to return to supine  Transfers Overall transfer level: Needs assistance Equipment used: 2 person hand held assist Transfers: Sit to/from Stand Sit to Stand: Mod assist;+2 physical assistance         General transfer comment: Mod A x2 to power to standing with therapist stabilizing bil knees due to buckling. Cues for upright posture as pt with poor trunk activation.   Ambulation/Gait             General Gait Details: defered secondary to significant weakness in standing   Stairs            Wheelchair Mobility    Modified Rankin (Stroke Patients Only)       Balance Overall balance assessment: Needs assistance Sitting-balance support: Feet supported;No upper extremity supported Sitting balance-Leahy Scale: Fair Sitting balance - Comments: pt tolerated sitting EOB without UE supports; however, could not tolerate any perturbations or dynamic balance challenges   Standing balance support: During functional activity;Bilateral upper extremity supported Standing balance-Leahy Scale: Poor Standing balance comment: pt required mod A x2 to maintain upright standing                    Cognition Arousal/Alertness: Awake/alert Behavior During Therapy: WFL for tasks assessed/performed Overall Cognitive Status: Within Functional Limits for tasks assessed                      Exercises      General Comments        Pertinent Vitals/Pain Pain Assessment: Faces Faces Pain Scale: No hurt Pain Intervention(s): Monitored during session   All VSS throughout.  Home Living                      Prior Function            PT Goals (current goals can now be found in the care plan section) Acute Rehab PT Goals Patient Stated Goal: to go to rehab PT Goal Formulation: With patient Time For Goal Achievement: 02/07/16 Potential to Achieve Goals: Good Progress towards PT goals: Progressing toward goals    Frequency    Min 3X/week       PT Plan Current plan remains appropriate    Co-evaluation PT/OT/SLP Co-Evaluation/Treatment: Yes Reason for Co-Treatment: For patient/therapist safety;To address functional/ADL transfers PT goals addressed during session: Mobility/safety with mobility;Balance;Strengthening/ROM       End of Session Equipment Utilized During Treatment: Gait belt;Oxygen (35% O2 on trach collar) Activity Tolerance: Patient limited by fatigue Patient left: in bed;with call bell/phone within reach;Other (comment) (SLP entering room to perform FEES)     Time: [id number] PT Time Calculation (min) (ACUTE ONLY): 25 min  Charges:  $Therapeutic Activity: 8-22 mins                    G CodesAlessandra Bevels:      Dalbert Stillings M Mariamawit Depaoli 01/25/2016, 10:46 AM Deborah ChalkJennifer Sophie Quiles, PT, DPT [phone]

## 2016-01-25 NOTE — Consult Note (Signed)
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 Huel Cote de Dios   HPI: Henry Hardin is a 30 y.o. male with history of asthma, medication non-compliance, ongoing tobacco use despite multiple asthma exacerbations in the past 6 moths. He was admitted on 01/09/16 with status asthmaticus and started on steroids and bronchodilators without improvement and required intubation in ED.  He was treated for MSSA PNA and has required sedation due to agitation. He failed attempts at extubation and required tracheostomy on 12/5. Has been weaned to ATC, tolerating PMSV during the day and regular diet. Patient noed to be deconditioned. CIR recommended for follow up therapy.    Review of Systems  Constitutional: Positive for malaise/fatigue.  HENT: Negative for hearing loss and tinnitus.   Respiratory: Negative for cough, sputum production and shortness of breath.   Cardiovascular: Negative for chest pain and palpitations.  Gastrointestinal: Negative for abdominal pain, heartburn and nausea.  Genitourinary: Negative for dysuria and frequency.  Musculoskeletal: Negative for back pain, joint pain and myalgias.  Skin: Negative for itching and rash.  Neurological: Positive for tremors and weakness. Negative for dizziness and headaches.  Psychiatric/Behavioral: Negative for memory loss. The patient is not nervous/anxious.       Past Medical History:  Diagnosis Date  . Asthma   . Pneumonia     History reviewed. No pertinent surgical history.    Family History  Problem Relation Age of Onset  . Healthy Mother       Social History:   Lives with fiancee and three children. Was independent and working PTA. He reports that he has been smoking Cigarettes.  He has been smoking about 0.75 packs per day. He has never used smokeless tobacco. He reports that he drinks alcohol. He reports that he does  not use drugs.     Allergies  Allergen Reactions  . Bee Venom Anaphylaxis, Itching and Swelling    Medications Prior to Admission  Medication Sig Dispense Refill  . acetaminophen (TYLENOL) 500 MG tablet Take 500-1,000 mg by mouth every 6 (six) hours as needed (for pain).    Marland Kitchen albuterol (PROAIR HFA) 108 (90 BASE) MCG/ACT inhaler Inhale 2 puffs into the lungs every 6 (six) hours as needed for wheezing or shortness of breath. 6.7 g 1  . ibuprofen (ADVIL,MOTRIN) 200 MG tablet Take 200-800 mg by mouth every 6 (six) hours as needed (for pain).    Marland Kitchen albuterol (PROVENTIL HFA;VENTOLIN HFA) 108 (90 Base) MCG/ACT inhaler Inhale 2 puffs into the lungs every 6 (six) hours as needed for wheezing or shortness of breath. (Patient not taking: Reported on 10/04/2015) 1 Inhaler 2  . cetirizine-pseudoephedrine (ZYRTEC-D) 5-120 MG tablet Take 1 tablet by mouth 2 (two) times daily. (Patient not taking: Reported on 12/18/2015) 20 tablet 0  . clindamycin (CLEOCIN) 300 MG capsule Take 1 capsule (300 mg total) by mouth 3 (three) times daily. (Patient not taking: Reported on 09/20/2015) 30 capsule 0  . diazepam (VALIUM) 5 MG tablet Take 1 tablet (5 mg total) by mouth every 8 (eight) hours as needed for anxiety. (Patient not taking: Reported on 09/20/2015) 20 tablet 0  . guaiFENesin (ROBITUSSIN) 100 MG/5ML liquid Take 5-10 mLs (100-200 mg total) by mouth every 4 (four) hours as needed for cough. (Patient not taking: Reported on 12/18/2015) 60 mL 0  . guaiFENesin-codeine 100-10 MG/5ML syrup Take 5-10 mLs by mouth every 6 (six) hours as needed  for cough. (Patient not taking: Reported on 09/20/2015) 120 mL 0  . HYDROcodone-acetaminophen (NORCO) 5-325 MG tablet Take 1 tablet by mouth every 6 (six) hours as needed for moderate pain. (Patient not taking: Reported on 09/20/2015) 20 tablet 0  . predniSONE (DELTASONE) 20 MG tablet Take 2 tablets (40 mg total) by mouth daily. (Patient not taking: Reported on 01/09/2016) 10 tablet 0     Home: Home Living Family/patient expects to be discharged to:: Private residence Living Arrangements: Spouse/significant other, Children Available Help at Discharge: Family Type of Home: House Home Access: Stairs to enter Secretary/administrator of Steps: 3 Entrance Stairs-Rails: Right Home Layout: Two level Alternate Level Stairs-Number of Steps: 1 flight Bathroom Shower/Tub: Engineer, manufacturing systems: Standard Home Equipment: None  Functional History: Prior Function Level of Independence: Independent Functional Status:  Mobility: Bed Mobility Overal bed mobility: Needs Assistance Bed Mobility: Supine to Sit, Sit to Supine Supine to sit: Min assist, HOB elevated Sit to supine: Mod assist, +2 for physical assistance, +2 for safety/equipment General bed mobility comments: Pt required min A with trunk to achieve sitting EOB; mod A x2 with trunk and bilateral LEs to return to supine Transfers Overall transfer level: Needs assistance Equipment used: 2 person hand held assist Transfers: Sit to/from Stand Sit to Stand: Mod assist, +2 physical assistance Stand pivot transfers: Max assist, +2 physical assistance General transfer comment: Mod A x2 to power to standing with therapist stabilizing bil knees due to buckling. Cues for upright posture as pt with poor trunk activation.  Ambulation/Gait General Gait Details: defered secondary to significant weakness in standing    ADL: ADL Overall ADL's : Needs assistance/impaired Grooming: Wash/dry hands, Wash/dry face, Minimal assistance, Sitting Upper Body Bathing: Maximal assistance Upper Body Bathing Details (indicate cue type and reason): mod A for balance/support at EOB Lower Body Bathing: Total assistance Upper Body Dressing : Maximal assistance Upper Body Dressing Details (indicate cue type and reason): mod A for balance/support at EOB Lower Body Dressing: Total assistance Toilet Transfer: Moderate assistance, Maximal  assistance, +2 for physical assistance, +2 for safety/equipment, Stand-pivot Toileting- Clothing Manipulation and Hygiene: Total assistance Functional mobility during ADLs: Moderate assistance, Maximal assistance, +2 for physical assistance, +2 for safety/equipment General ADL Comments:  Poor dynmamic sitting balance due to trunk/UB weakness. Poor cervical muscle strength to keep head/neck upright. Sat EOB ~ 5 minutes with mod A  Cognition: Cognition Overall Cognitive Status: Within Functional Limits for tasks assessed Orientation Level: Oriented X4 Cognition Arousal/Alertness: Awake/alert Behavior During Therapy: WFL for tasks assessed/performed Overall Cognitive Status: Within Functional Limits for tasks assessed Difficult to assess due to: Tracheostomy   Blood pressure 118/82, pulse 85, temperature 99.1 F (37.3 C), temperature source Oral, resp. rate 17, height 6\' 3"  (1.905 m), weight 72.8 kg (160 lb 7.9 oz), SpO2 99 %. Physical Exam  Nursing note and vitals reviewed. Constitutional: He is oriented to person, place, and time. He appears well-developed and well-nourished. No distress.  HENT:  Head: Normocephalic and atraumatic.  Mouth/Throat: Oropharynx is clear and moist.  Eyes: Conjunctivae and EOM are normal. Pupils are equal, round, and reactive to light. Right eye exhibits no discharge. Left eye exhibits no discharge.  Neck: Normal range of motion. Neck supple.  Cuffed trach with PMSV and ATC in place.   Cardiovascular: Normal rate and regular rhythm.   Respiratory: Effort normal. No stridor.  GI: Soft. Bowel sounds are normal. He exhibits no distension. There is no tenderness.  Musculoskeletal: He exhibits no edema or  tenderness.  Neurological: He is alert and oriented to person, place, and time.  Speech clear and able to follow basic commands without difficulty. UE: 4- deltoid, 4/5 biceps and triceps and 5/5 HI. LE: 3- HF, 3KE and 4/5 ADF/PF. No sensory deficits.   Skin:  Skin is warm. He is not diaphoretic.  Psychiatric: He has a normal mood and affect. His behavior is normal. Thought content normal.    Results for orders placed or performed during the hospital encounter of 01/09/16 (from the past 24 hour(s))  Glucose, capillary     Status: Abnormal   Collection Time: 01/24/16  4:27 PM  Result Value Ref Range   Glucose-Capillary 120 (H) 65 - 99 mg/dL  BMET today at [id number]     Status: Abnormal   Collection Time: 01/24/16  7:51 PM  Result Value Ref Range   Sodium 137 135 - 145 mmol/L   Potassium 3.4 (L) 3.5 - 5.1 mmol/L   Chloride 107 101 - 111 mmol/L   CO2 22 22 - 32 mmol/L   Glucose, Bld 149 (H) 65 - 99 mg/dL   BUN 26 (H) 6 - 20 mg/dL   Creatinine, Ser 1.910.72 0.61 - 1.24 mg/dL   Calcium 8.6 (L) 8.9 - 10.3 mg/dL   GFR calc non Af Amer >60 >60 mL/min   GFR calc Af Amer >60 >60 mL/min   Anion gap 8 5 - 15  Glucose, capillary     Status: Abnormal   Collection Time: 01/24/16  8:13 PM  Result Value Ref Range   Glucose-Capillary 141 (H) 65 - 99 mg/dL  Glucose, capillary     Status: Abnormal   Collection Time: 01/25/16 12:39 AM  Result Value Ref Range   Glucose-Capillary 135 (H) 65 - 99 mg/dL  Glucose, capillary     Status: None   Collection Time: 01/25/16  3:53 AM  Result Value Ref Range   Glucose-Capillary 92 65 - 99 mg/dL  BMET in AM     Status: Abnormal   Collection Time: 01/25/16  4:00 AM  Result Value Ref Range   Sodium 139 135 - 145 mmol/L   Potassium 3.1 (L) 3.5 - 5.1 mmol/L   Chloride 104 101 - 111 mmol/L   CO2 25 22 - 32 mmol/L   Glucose, Bld 96 65 - 99 mg/dL   BUN 24 (H) 6 - 20 mg/dL   Creatinine, Ser 4.780.68 0.61 - 1.24 mg/dL   Calcium 8.7 (L) 8.9 - 10.3 mg/dL   GFR calc non Af Amer >60 >60 mL/min   GFR calc Af Amer >60 >60 mL/min   Anion gap 10 5 - 15  Triglycerides     Status: None   Collection Time: 01/25/16  4:32 AM  Result Value Ref Range   Triglycerides 94 <150 mg/dL  Glucose, capillary     Status: Abnormal   Collection Time:  01/25/16  8:17 AM  Result Value Ref Range   Glucose-Capillary 123 (H) 65 - 99 mg/dL  Glucose, capillary     Status: Abnormal   Collection Time: 01/25/16 12:35 PM  Result Value Ref Range   Glucose-Capillary 131 (H) 65 - 99 mg/dL   Dg Abd Portable 1v  Result Date: 01/23/2016 CLINICAL DATA:  Feeding tube placement EXAM: PORTABLE ABDOMEN - 1 VIEW COMPARISON:  01/19/2016 FINDINGS: Feeding tube coils in the stomach with the tip in the fundus. Mild diffuse gaseous distention of bowel. Mild gaseous distention of the stomach. IMPRESSION: Feeding tube coils in the stomach with  the tip in the fundus. Electronically Signed   By: Charlett NoseKevin  Dover M.D.   On: 01/23/2016 14:20    Assessment/Plan: Diagnosis: critical illness myopathy 1. Does the need for close, 24 hr/day medical supervision in concert with the patient's rehab needs make it unreasonable for this patient to be served in a less intensive setting? Yes 2. Co-Morbidities requiring supervision/potential complications: asthma/trach 3. Due to bladder management, bowel management, safety, skin/wound care, disease management, medication administration, pain management and patient education, does the patient require 24 hr/day rehab nursing? Yes 4. Does the patient require coordinated care of a physician, rehab nurse, PT (1-2 hrs/day, 5 days/week), OT (1-2 hrs/day, 5 days/week) and SLP (1-2 hrs/day, 5 days/week) to address physical and functional deficits in the context of the above medical diagnosis(es)? Yes Addressing deficits in the following areas: balance, endurance, locomotion, strength, transferring, bowel/bladder control, bathing, dressing, feeding, grooming, toileting and psychosocial support 5. Can the patient actively participate in an intensive therapy program of at least 3 hrs of therapy per day at least 5 days per week? Yes 6. The potential for patient to make measurable gains while on inpatient rehab is excellent 7. Anticipated functional  outcomes upon discharge from inpatient rehab are modified independent  with PT, modified independent with OT, modified independent with SLP. 8. Estimated rehab length of stay to reach the above functional goals is: 13-18 days 9. Does the patient have adequate social supports and living environment to accommodate these discharge functional goals? Yes 10. Anticipated D/C setting: Home 11. Anticipated post D/C treatments: HH therapy and Outpatient therapy 12. Overall Rehab/Functional Prognosis: excellent  RECOMMENDATIONS: This patient's condition is appropriate for continued rehabilitative care in the following setting: CIR Patient has agreed to participate in recommended program. Yes Note that insurance prior authorization may be required for reimbursement for recommended care.  Comment: Rehab Admissions Coordinator to follow up.  Thanks,  Ranelle OysterZachary T. Swartz, MD, Earlie CountsFAAPMR    Love, Pamela S, PA-C 01/25/2016

## 2016-01-25 NOTE — Progress Notes (Signed)
NIF -20. VC 1.25L.  Pt gave good effort.

## 2016-01-25 NOTE — Progress Notes (Addendum)
PULMONARY / CRITICAL CARE MEDICINE   Name: Henry Hardin MRN: [id number] DOB: 05/21/1985    ADMISSION DATE:  01/09/2016 CONSULTATION DATE:  01/10/2016  REFERRING MD:  Dr. Ethelda ChickJacubowitz  CHIEF COMPLAINT:  SOB  HISTORY OF PRESENT ILLNESS:   30 year old male with PMH asthma since childhood. He has never been consistent with taking his controller medications and has been unable to get albuterol due to insurance issues per mother. He has been smoking about a pack per day for the past 6 years during which time his symptoms have gotten worse. He has been seen in the ED 8 times in the past 6 months with exacerbations of asthma. He has been intubated once in the past. 11/22 he presented to the ED with CC respiratory distress for about 3 hours. He was treated with mag, albuterol, and atrovent by EMS. He was additionally treated with solumedrol, and additional bronchodilators in ED without improvement. His mental status began to worsen and he required intubation. PCCM asked to admit.   SUBJECTIV Getting stronger Passed swallow   VITAL SIGNS: BP 130/84 (BP Location: Left Arm)   Pulse 65   Temp 99 F (37.2 C) (Oral)   Resp 16   Ht 6\' 3"  (1.905 m)   Wt 160 lb 7.9 oz (72.8 kg)   SpO2 98%   BMI 20.06 kg/m   HEMODYNAMICS:    VENTILATOR SETTINGS: FiO2 (%):  [30 %-35 %] 30 %  INTAKE / OUTPUT: I/O last 3 completed shifts: In: 930 [IV Piggyback:930] Out: 1475 [Urine:1475]  PHYSICAL EXAMINATION: General:  Young adult male,eyes open,follows commands. Neuro: awake, oriented. No focal def  HEENT:  Trach wnl, excellent phonation w #8 and PMV Cardiovascular:  s1s2 regular, no MRG Lungs:good cough, exp wheeze Abdomen:  Soft, non-tender, non-distended, + bs , cortrak Musculoskeletal:  No acute deformity or ROM limitation Skin:  Grossly intact  LABS:  BMET  Recent Labs Lab 01/24/16 0554 01/24/16 1951 01/25/16 0400  NA 140 137 139  K 2.7* 3.4* 3.1*  CL 106 107 104  CO2 23 22 25   BUN  28* 26* 24*  CREATININE 0.67 0.72 0.68  GLUCOSE 107* 149* 96    Electrolytes  Recent Labs Lab 01/19/16 0330 01/20/16 0425  01/24/16 0554 01/24/16 1951 01/25/16 0400  CALCIUM 8.3* 8.4*  < > 8.0* 8.6* 8.7*  MG 2.0 2.1  --   --   --   --   PHOS  --  4.1  --   --   --   --   < > = values in this interval not displayed.  CBC  Recent Labs Lab 01/20/16 0425 01/21/16 0503 01/23/16 0644  WBC 22.7* 19.4* 18.4*  HGB 12.7* 12.1* 11.9*  HCT 38.6* 37.5* 36.9*  PLT 191 185 229    Coag's  Recent Labs Lab 01/21/16 0815 01/21/16 0900 01/23/16 0644  APTT 25 25 25   INR 1.10 1.10 1.12    Sepsis Markers No results for input(s): LATICACIDVEN, PROCALCITON, O2SATVEN in the last 168 hours.  ABG  Recent Labs Lab 01/19/16 0758  PHART 7.481*  PCO2ART 45.8  PO2ART 61.9*    Liver Enzymes  Recent Labs Lab 01/22/16 0500  AST 88*  ALT 126*  ALKPHOS 57  BILITOT 0.6  ALBUMIN 2.6*    Cardiac Enzymes No results for input(s): TROPONINI, PROBNP in the last 168 hours.  Glucose  Recent Labs Lab 01/24/16 1156 01/24/16 1627 01/24/16 2013 01/25/16 0039 01/25/16 0353 01/25/16 0817  GLUCAP 160* 120*  141* 135* 92 123*    Imaging No results found.   STUDIES:    CULTURES: MRSA (-) Trache asp 11/24 > (-) Blood 11/24 (-) Trach 11/26  > MSSA  ANTIBIOTICS: Azithro 11/24 > 11/26 Zosyn 11/26 >> 11/28 vanco x 1 11/28 Ancef 11/28 >>  SIGNIFICANT EVENTS: 11/22  intubated for asthma 11/24  air trapping 11/28  paralysis off 12/2 failed extubation 12/5 #8 shiley (DF)  LINES/TUBES: ETT 11/23 >>12/2 promptly failed and re intubated 12/2>>12/5 #8 Shiley 12/5>> RUE PICC 11/26 >>  DISCUSSION: 30 year old male smoker with asthma since childhood, poorly compliant r/t cost. Frequent visits/admissions.  Admitted 11/23 with status asthmaticus requiring intubation. Has required NMB. Trached 12/5. Off vent. Deconditioning and critical care associated myopathy largest  barrier. Will ask CIR to see him. Can go any time now. Monday will down size trach to 6 cuffless.   ASSESSMENT / PLAN:  Acute hypercarbic respiratory failure secondary to status asthmaticus:  No focal opacification on CXR.  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 critical illnes (paralysis, steroids)-- trached 12/5 Plan: pred taper  Cont BDs Aggressive PT Trach collar w/ plan to change to 6 cuffless in anticipation to capping trials 12/11 PMV Cuff down  Acute metabolic encephalopathy At Risk Deconditioning  Suspect CIP Plan: Reduce ativan slowly, avoid wd SLP PMV Aggressive PT OT for splints  Will ask CIR to see  Hypokalemia  Plan Replace  MSSA PNA plan Ancef for 14 days, stop date in place  Constipation At Risk Malnutrition  plan Continue Miralax  TF held, getting SLP Pepcid  Best practice VTE ppx: SCDs and SQ heparin Diet regular    Family: none in room  Simonne MartinetPeter E Babcock ACNP-BC [facility] Pulmonary/Critical Care Pager # [phone] OR # [phone] if no answer

## 2016-01-26 LAB — BASIC METABOLIC PANEL
ANION GAP: 8 (ref 5–15)
BUN: 19 mg/dL (ref 6–20)
CHLORIDE: 102 mmol/L (ref 101–111)
CO2: 25 mmol/L (ref 22–32)
Calcium: 8.4 mg/dL — ABNORMAL LOW (ref 8.9–10.3)
Creatinine, Ser: 0.73 mg/dL (ref 0.61–1.24)
GFR calc non Af Amer: >60 mL/min (ref >60)
Glucose, Bld: 118 mg/dL — ABNORMAL HIGH (ref 65–99)
POTASSIUM: 3.3 mmol/L — AB (ref 3.5–5.1)
SODIUM: 135 mmol/L (ref 135–145)

## 2016-01-26 LAB — GLUCOSE, CAPILLARY
GLUCOSE-CAPILLARY: 137 mg/dL — AB (ref 65–99)
GLUCOSE-CAPILLARY: 152 mg/dL — AB (ref 65–99)
Glucose-Capillary: 199 mg/dL — ABNORMAL HIGH (ref 65–99)
Glucose-Capillary: 150 mg/dL — ABNORMAL HIGH (ref 65–99)
Glucose-Capillary: 177 mg/dL — ABNORMAL HIGH (ref 65–99)
Glucose-Capillary: 156 mg/dL — ABNORMAL HIGH (ref 65–99)
Glucose-Capillary: 133 mg/dL — ABNORMAL HIGH (ref 65–99)

## 2016-01-26 LAB — MAGNESIUM: MAGNESIUM: 1.8 mg/dL (ref 1.7–2.4)

## 2016-01-26 MED ORDER — PHENOL 1.4 % MT LIQD
2.0000 | OROMUCOSAL | Status: DC | PRN
  Administered 2016-01-26: 2 via OROMUCOSAL
  Filled 2016-01-26: qty 177

## 2016-01-26 NOTE — Progress Notes (Signed)
Patient trached. NIF and VC held for now.

## 2016-01-26 NOTE — Progress Notes (Signed)
PULMONARY / CRITICAL CARE MEDICINE   Name: Henry Hardin MRN: [id number] DOB: 04/18/1985    ADMEnzo BiSSION DATE:  01/09/2016 CONSULTATION DATE:  01/10/2016  REFERRING MD:  Dr. Ethelda ChickJacubowitz  CHIEF COMPLAINT:  SOB  HISTORY OF PRESENT ILLNESS:   30 year old male with PMH asthma since childhood. He has never been consistent with taking his controller medications and has been unable to get albuterol due to insurance issues per mother. He has been smoking about a pack per day for the past 6 years during which time his symptoms have gotten worse. He has been seen in the ED 8 times in the past 6 months with exacerbations of asthma. He has been intubated once in the past. 11/22 he presented to the ED with CC respiratory distress for about 3 hours. He was treated with mag, albuterol, and atrovent by EMS. He was additionally treated with solumedrol, and additional bronchodilators in ED without improvement. His mental status began to worsen and he required intubation. PCCM asked to admit.   SUBJECTIV NAD / wants trach out  VITAL SIGNS: BP 117/71   Pulse (!) 58   Temp 97.7 F (36.5 C) (Oral)   Resp 17   Ht 6\' 3"  (1.905 m)   Wt 160 lb 15 oz (73 kg)   SpO2 99%   BMI 20.12 kg/m   HEMODYNAMICS:    VENTILATOR SETTINGS: FiO2 (%):  [30 %] 30 %  INTAKE / OUTPUT: I/O last 3 completed shifts: In: 780 [P.O.:480; IV Piggyback:300] Out: 1800 [Urine:1800]  PHYSICAL EXAMINATION: General:  Young adult male,eyes open,follows commands. Neuro: awake, oriented. No focal def  HEENT:  Trach wnl, excellent phonation w #8 and PMV Cardiovascular:  s1s2 regular, no MRG Lungs:good cough, lungs clear bilaterally with trach plugged manually  Abdomen:  Soft, non-tender, non-distended, + bs , cortrak Musculoskeletal:  No acute deformity or ROM limitation Skin:  Grossly intact  LABS:  BMET  Recent Labs Lab 01/24/16 1951 01/25/16 0400 01/26/16 0357  NA 137 139 135  K 3.4* 3.1* 3.3*  CL 107 104 102  CO2 22  25 25   BUN 26* 24* 19  CREATININE 0.72 0.68 0.73  GLUCOSE 149* 96 118*    Electrolytes  Recent Labs Lab 01/20/16 0425  01/24/16 1951 01/25/16 0400 01/26/16 0357  CALCIUM 8.4*  < > 8.6* 8.7* 8.4*  MG 2.1  --   --   --  1.8  PHOS 4.1  --   --   --   --   < > = values in this interval not displayed.  CBC  Recent Labs Lab 01/20/16 0425 01/21/16 0503 01/23/16 0644  WBC 22.7* 19.4* 18.4*  HGB 12.7* 12.1* 11.9*  HCT 38.6* 37.5* 36.9*  PLT 191 185 229    Coag's  Recent Labs Lab 01/21/16 0815 01/21/16 0900 01/23/16 0644  APTT 25 25 25   INR 1.10 1.10 1.12    Sepsis Markers No results for input(s): LATICACIDVEN, PROCALCITON, O2SATVEN in the last 168 hours.  ABG No results for input(s): PHART, PCO2ART, PO2ART in the last 168 hours.  Liver Enzymes  Recent Labs Lab 01/22/16 0500  AST 88*  ALT 126*  ALKPHOS 57  BILITOT 0.6  ALBUMIN 2.6*    Cardiac Enzymes No results for input(s): TROPONINI, PROBNP in the last 168 hours.  Glucose  Recent Labs Lab 01/25/16 1235 01/25/16 1641 01/25/16 2102 01/26/16 0032 01/26/16 0430 01/26/16 0916  GLUCAP 131* 118* 189* 152* 137* 199*    Imaging No results found.  STUDIES:    CULTURES: MRSA (-) Trache asp 11/24 > (-) Blood 11/24 (-) Trach 11/26  > MSSA  ANTIBIOTICS: Azithro 11/24 > 11/26 Zosyn 11/26 >> 11/28 vanco x 1 11/28 Ancef 11/28 >>  SIGNIFICANT EVENTS: 11/22  intubated for asthma 11/24  air trapping 11/28  paralysis off 12/2 failed extubation 12/5 #8 shiley (DF)  LINES/TUBES: ETT 11/23 >>12/2 promptly failed and re intubated 12/2>>12/5 #8 Shiley 12/5>> RUE PICC 11/26 >>  DISCUSSION: 30 year old male smoker with asthma since childhood, poorly compliant r/t cost. Frequent visits/admissions.  Admitted 11/23 with status asthmaticus requiring intubation. Has required NMB. Trached 12/5. Off vent. 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 Will ask CIR to see him.  Can go any time now. 12/10  will down size trach to 6 cuffless.   ASSESSMENT / PLAN:  Acute hypercarbic respiratory failure secondary to status asthmaticus:  No focal opacification on CXR.  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 critical illnes (paralysis, steroids)-- trached 12/5 Plan: pred taper  Cont BDs Aggressive PT Trach collar w/ plan to change to 6 cuffless in anticipation to capping trials 12/11 PMV Cuff down  Acute metabolic encephalopathy At Risk Deconditioning  Suspect CIP Plan: Reduce ativan slowly, avoid wd SLP PMV Aggressive PT OT for splints  CIR saw 01/25/16 > ok for transfer when insurance worked out    Hypokalemia  Plan Replace  MSSA PNA plan Ancef for 14 days, stop date in place  Constipation At Risk Malnutrition  plan Continue Miralax  TF held, getting SLP Pepcid  Best practice VTE ppx: SCDs and SQ heparin Diet regular       Sandrea HughsMichael Isley Weisheit, MD Pulmonary and Critical Care Medicine [facility] Cell [phone] After 5:30 PM or weekends, call [phone]

## 2016-01-27 DIAGNOSIS — J4541 Moderate persistent asthma with (acute) exacerbation: Secondary | ICD-10-CM

## 2016-01-27 LAB — GLUCOSE, CAPILLARY
GLUCOSE-CAPILLARY: 170 mg/dL — AB (ref 65–99)
GLUCOSE-CAPILLARY: 128 mg/dL — AB (ref 65–99)
GLUCOSE-CAPILLARY: 222 mg/dL — AB (ref 65–99)
GLUCOSE-CAPILLARY: 224 mg/dL — AB (ref 65–99)
GLUCOSE-CAPILLARY: 276 mg/dL — AB (ref 65–99)
Glucose-Capillary: 149 mg/dL — ABNORMAL HIGH (ref 65–99)

## 2016-01-27 MED ORDER — ALTEPLASE 2 MG IJ SOLR
2.0000 mg | Freq: Once | INTRAMUSCULAR | Status: AC
  Administered 2016-01-27: 2 mg
  Filled 2016-01-27: qty 2

## 2016-01-27 MED ORDER — ORAL CARE MOUTH RINSE
15.0000 mL | Freq: Two times a day (BID) | OROMUCOSAL | Status: DC
  Administered 2016-01-27 – 2016-01-29 (×5): 15 mL via OROMUCOSAL

## 2016-01-27 MED ORDER — HYDROCODONE-ACETAMINOPHEN 5-325 MG PO TABS
1.0000 | ORAL_TABLET | ORAL | Status: DC | PRN
  Administered 2016-01-27 – 2016-01-29 (×5): 1 via ORAL
  Filled 2016-01-27 (×5): qty 1

## 2016-01-27 MED ORDER — MOMETASONE FURO-FORMOTEROL FUM 200-5 MCG/ACT IN AERO
2.0000 | INHALATION_SPRAY | Freq: Two times a day (BID) | RESPIRATORY_TRACT | Status: DC
  Administered 2016-01-27 – 2016-01-28 (×2): 2 via RESPIRATORY_TRACT
  Filled 2016-01-27 (×2): qty 8.8

## 2016-01-27 MED ORDER — ALBUTEROL SULFATE (2.5 MG/3ML) 0.083% IN NEBU: 2.5000 mg | INHALATION_SOLUTION | RESPIRATORY_TRACT | Status: DC | PRN

## 2016-01-27 NOTE — Plan of Care (Signed)
Problem: Activity: Goal: Risk for activity intolerance will decrease   Outcome: Completed/Met Date Met: 01/27/16 Patient walked approximately 400 ft in the hall with front wheel walker. Sitting on the side of bed often. Has walked to the chair multiple times.

## 2016-01-27 NOTE — Procedures (Signed)
Tracheostomy Change Note  Patient Details:   Name: Enzo BiReegius L Weipert DOB: 10/21/1985 MRN: [id number]    Airway Documentation:     Evaluation  O2 sats: stable throughout Complications: No apparent complications Patient did tolerate procedure well. Bilateral Breath Sounds: Clear, Diminished    Danella Maiersshley M [facility] 01/27/2016, 11:58 AM

## 2016-01-27 NOTE — Progress Notes (Addendum)
PULMONARY / CRITICAL CARE MEDICINE   Name: Henry Hardin MRN: [id number] DOB: 02/19/1985    ADMISSION DATE:  01/09/2016 CONSULTATION DATE:  01/10/2016  REFERRING MD:  Dr. Ethelda ChickJacubowitz  CHIEF COMPLAINT:  SOB  HISTORY OF PRESENT ILLNESS:   30 year old male with PMH asthma since childhood. He has never been consistent with taking his controller medications and has been unable to get albuterol due to insurance issues per mother. He has been smoking about a pack per day for the past 6 years during which time his symptoms have  worsened >> seen in the ED 8 times in the past 6 months with exacerbations of asthma. He has been intubated once in the past. 11/22 he presented to the ED with CC respiratory distress for about 3 hours. He was treated with mag, albuterol, and atrovent by EMS. He was additionally treated with solumedrol, and additional bronchodilators in ED without improvement. His mental status began to worsen and he required intubation. PCCM asked to admit.   SUBJECTIV NAD / wants trach out  VITAL SIGNS: BP 126/79   Pulse 62   Temp 97.8 F (36.6 C) (Oral)   Resp 19   Ht 6\' 3"  (1.905 m)   Wt 160 lb 15 oz (73 kg)   SpO2 99%   BMI 20.12 kg/m   FIO2  RA/ sats 99% on rounds  HEMODYNAMICS:    VENTILATOR SETTINGS: FiO2 (%):  [28 %-30 %] 28 %  INTAKE / OUTPUT: I/O last 3 completed shifts: In: 1940 [P.O.:1940] Out: 2025 [Urine:2025]  PHYSICAL EXAMINATION: General:  Young adult male nad Neuro: awake, oriented. No focal def  HEENT:  Trach wnl, excellent phonation w #8 and PMV Cardiovascular:  s1s2 regular, no MRG Lungs:good cough, lungs clear bilaterally with trach plugged manually  Abdomen:  Soft, non-tender, non-distended, + bs , cortrak Musculoskeletal:  No acute deformity or ROM limitation Skin:  Grossly intact  LABS:  BMET  Recent Labs Lab 01/24/16 1951 01/25/16 0400 01/26/16 0357  NA 137 139 135  K 3.4* 3.1* 3.3*  CL 107 104 102  CO2 22 25 25   BUN 26* 24*  19  CREATININE 0.72 0.68 0.73  GLUCOSE 149* 96 118*    Electrolytes  Recent Labs Lab 01/24/16 1951 01/25/16 0400 01/26/16 0357  CALCIUM 8.6* 8.7* 8.4*  MG  --   --  1.8    CBC  Recent Labs Lab 01/21/16 0503 01/23/16 0644  WBC 19.4* 18.4*  HGB 12.1* 11.9*  HCT 37.5* 36.9*  PLT 185 229    Coag's  Recent Labs Lab 01/21/16 0815 01/21/16 0900 01/23/16 0644  APTT 25 25 25   INR 1.10 1.10 1.12    Sepsis Markers No results for input(s): LATICACIDVEN, PROCALCITON, O2SATVEN in the last 168 hours.  ABG No results for input(s): PHART, PCO2ART, PO2ART in the last 168 hours.  Liver Enzymes  Recent Labs Lab 01/22/16 0500  AST 88*  ALT 126*  ALKPHOS 57  BILITOT 0.6  ALBUMIN 2.6*    Cardiac Enzymes No results for input(s): TROPONINI, PROBNP in the last 168 hours.  Glucose  Recent Labs Lab 01/26/16 1551 01/26/16 2002 01/26/16 2204 01/27/16 0012 01/27/16 0455 01/27/16 0738  GLUCAP 177* 156* 133* 149* 170* 128*    Imaging No results found.   STUDIES:    CULTURES: MRSA (-) Trache asp 11/24 > (-) Blood 11/24 (-) Trach 11/26  > MSSA  ANTIBIOTICS: Azithro 11/24 > 11/26 Zosyn 11/26 >> 11/28 vanco x 1 11/28 Ancef  11/28 >>  SIGNIFICANT EVENTS: 11/22  intubated for asthma 11/24  air trapping 11/28  paralysis off 12/2 failed extubation 12/5 #8 shiley (DF) 12/10 #6 requested cuffless with plug trial   LINES/TUBES: ETT 11/23 >>12/2 promptly failed and re intubated 12/2>>12/5 #8 Shiley 12/5>>12/10 RUE PICC 11/26 >>  DISCUSSION: 30 year old male smoker with asthma since childhood, poorly compliant r/t cost. Frequent visits/admissions.  Admitted 11/23 with status asthmaticus requiring intubation. Has required NMB. Trached 12/5. Off vent. 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 Will ask CIR to see him. Can go any time now. 12/10  will down size trach to 6 cuffless.   ASSESSMENT / PLAN:  Acute hypercarbic  respiratory failure secondary to status asthmaticus:  No focal opacification on CXR.  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 critical illnes (paralysis, steroids)-- trached 12/5 Plan: pred taper  Cont BDs but change over to dulera 200 2bid 12/10 and all others prn Aggressive PT Trach collar w/ plan to change to 6 cuffless in anticipation to capping trials 12/10 PMV Cuff down  Acute metabolic encephalopathy At Risk Deconditioning  Suspect CIP Plan: Reduce ativan slowly, avoid wd SLP PMV Aggressive PT OT for splints  CIR saw 01/25/16 > ok for transfer when insurance worked out    Hypokalemia  Plan Replace  MSSA PNA plan Ancef for 14 days, stop date in place  Constipation At Risk Malnutrition  plan Continue Miralax  TF held, getting SLP Pepcid  Best practice VTE ppx: SCDs and SQ heparin Diet regular       Sandrea HughsMichael Harm Jou, MD Pulmonary and Critical Care Medicine [facility] Cell [phone] After 5:30 PM or weekends, call [phone]

## 2016-01-27 NOTE — Plan of Care (Signed)
Problem: Physical Regulation: Goal: Will remain free from infection Outcome: Progressing Patient trach changed from #8 to #6 cuffless and capped. Patient being monitored for tolerance. No signs of infection.   Problem: Nutrition: Goal: Adequate nutrition will be maintained Outcome: Completed/Met Date Met: 01/27/16 Patient has good appetite and tolerating diet well.   Problem: Bowel/Gastric: Goal: Will not experience complications related to bowel motility Outcome: Completed/Met Date Met: 01/27/16 Patient having regular bowel movements.

## 2016-01-27 NOTE — Progress Notes (Signed)
Patient downsized to a #6 CFLS trach and capped per Dr Sherene SiresWert. Patient is tolerating well with no complications at this time. RT will continue to monitor.

## 2016-01-27 NOTE — Progress Notes (Signed)
Pt asked to remove trach collar. This RN talked with Respiratory before removing trach collar. Respiratory said he was only on 28%. Trach collar was removed. Patient O2 saturation at 98%. No signs of distressing and sitting in bed comfortably. Will continue to monitor closely.

## 2016-01-27 NOTE — Progress Notes (Signed)
Patient used front wheel walker to ambulate in the hall. The patient walked approximately 400 ft (from his room, to the nurses' desk and back). Was on room air with no episodes of desaturation or respiratory distress. HR stayed within normal limits. Patient only complained about a headache and pain medicine was administered (see MAR). Patient got back into bed safely.

## 2016-01-28 LAB — GLUCOSE, CAPILLARY
GLUCOSE-CAPILLARY: 150 mg/dL — AB (ref 65–99)
GLUCOSE-CAPILLARY: 186 mg/dL — AB (ref 65–99)
GLUCOSE-CAPILLARY: 228 mg/dL — AB (ref 65–99)
GLUCOSE-CAPILLARY: 72 mg/dL (ref 65–99)
Glucose-Capillary: 119 mg/dL — ABNORMAL HIGH (ref 65–99)
Glucose-Capillary: 151 mg/dL — ABNORMAL HIGH (ref 65–99)
Glucose-Capillary: 159 mg/dL — ABNORMAL HIGH (ref 65–99)
Glucose-Capillary: 207 mg/dL — ABNORMAL HIGH (ref 65–99)

## 2016-01-28 MED ORDER — PREDNISONE 10 MG PO TABS
30.0000 mg | ORAL_TABLET | Freq: Every day | ORAL | Status: DC
  Administered 2016-01-29: 10:00:00 30 mg via ORAL
  Filled 2016-01-28: qty 1

## 2016-01-28 MED ORDER — MORPHINE SULFATE (PF) 4 MG/ML IV SOLN
4.0000 mg | Freq: Once | INTRAVENOUS | Status: AC
  Administered 2016-01-28: 4 mg via INTRAVENOUS
  Filled 2016-01-28: qty 1

## 2016-01-28 MED ORDER — MAGNESIUM SULFATE 2 GM/50ML IV SOLN
2.0000 g | Freq: Once | INTRAVENOUS | Status: DC
  Filled 2016-01-28: qty 50

## 2016-01-28 NOTE — Progress Notes (Signed)
Occupational Therapy Treatment Patient Details Name: Enzo BiReegius L Milnes MRN: [id number] DOB: 03/22/1985 Today's Date: 01/28/2016    History of present illness Patient is a 30 y/o male with hx of asthma, who has never been consistent with taking his controller medications and has been unable to get albuterol due to insurance issues as well as a smoker (1 pack per day) presents with asthma exacerbation and went into respiratory distress s/p trach.   OT comments  Pt has significantly improved since last session. Pt eager for  OOB for activity. Pt will be returning home with assist from GF as CIR has no bed available. Pt required +2 assist last session and fatigued very easily and pt now able to sit EOB with no physical assist and ambulate to bathroom with RW. Pt has a jetted tub shower at home and states that he will be able to sit on ledge and slide over to sit for showers when appropriate. OT will continue to foloow acutely  Follow Up Recommendations  Supervision/Assistance - 24 hour (initially, CIR had no bed available)    Equipment Recommendations  Other (comment) Lexicographer(reacher)    Recommendations for Other Services      Precautions / Restrictions Precautions Precautions: Fall Restrictions Weight Bearing Restrictions: No       Mobility Bed Mobility Overal bed mobility: Needs Assistance Bed Mobility: Supine to Sit     Supine to sit: Supervision     General bed mobility comments: no physical assist required  Transfers Overall transfer level: Needs assistance Equipment used: Rolling walker (2 wheeled) Transfers: Sit to/from Stand Sit to Stand: Min guard         General transfer comment: pt able to stand at EOB without physical assist, however with decreased balance, weakness in LEs    Balance Overall balance assessment: Needs assistance   Sitting balance-Leahy Scale: Fair       Standing balance-Leahy Scale: Poor (dynamic) Standing balance comment: pt balance has greatly  improved from last session, howevr still requires min guard A for safety and pt moving with slow pace                   ADL Overall ADL's : Needs assistance/impaired     Grooming: Wash/dry hands;Wash/dry face;Min guard;Standing                   Toilet Transfer: Min guard;RW;Ambulation;Regular Toilet;Grab bars   Toileting- ArchitectClothing Manipulation and Hygiene: Sit to/from stand;Min guard   Tub/ Shower Transfer: Scientist, clinical (histocompatibility and immunogenetics)Min guard;Rolling walker;Grab bars Tub/Shower Transfer Details (indicate cue type and reason): simulated for tub shower   General ADL Comments: nursing staff transferring pt upon arrival and pt fully dressed. Pt states that he dressed himself with set up from nursing staff                 Cognition     Overall Cognitive Status: Within Functional Limits for tasks assessed                       Extremity/Trunk Assessment   generalized weakness                        General Comments  pt very pleasant and cooperative, eager to work with therapy    Pertinent Vitals/ Pain       Pain Assessment: No/denies pain Pain Intervention(s): Monitored during session  Home Living    home with GF and children  Prior Functioning/Environment  independent           Frequency  Min 2X/week        Progress Toward Goals  OT Goals(current goals can now be found in the care plan section)  Progress towards OT goals: Progressing toward goals  Acute Rehab OT Goals Patient Stated Goal: go home OT Goal Formulation: With patient  Plan Frequency remains appropriate;Equipment recommendations need to be updated                     End of Session Equipment Utilized During Treatment: Gait belt;Rolling walker   Activity Tolerance Patient tolerated treatment well   Patient Left  (ambulating with PT)             Time: [id number] OT Time Calculation (min): 14 min  Charges: OT  General Charges $OT Visit: 1 Procedure OT Treatments $Self Care/Home Management : 8-22 mins  Galen ManilaSpencer, Rishan Oyama Jeanette 01/28/2016, 1:27 PM

## 2016-01-28 NOTE — Care Management Note (Signed)
Case Management Note  Patient Details  Name: Henry Hardin MRN: [id number] Date of Birth: 08/04/1985  Subjective/Objective:   Pt states he lives with girlfriend and 11 children, is very motivated to increase activity and discharge home.  Pt had planned on rehab @ CIR but has been told there is no bed for him.  He has done well with capped trach and CCM NP expects to decannulate tomorrow.  Girlfriend states she will be providing 24/7 assistance as needed, [facility]                          Expected Discharge Plan:  Home/Self Care  Discharge planning Services  [facility] Consult, [facility]  Jamelle RushingMayo, Jvon Meroney T, [place]RN 01/28/2016, 11:38 AM

## 2016-01-28 NOTE — Progress Notes (Signed)
eLink Physician-Brief Progress Note Patient Name: Enzo BiReegius L Cleek DOB: 04/13/1985 MRN: [id number]   Date of Service  01/28/2016  HPI/Events of Note  Patient requesting something additional for HA besides Norco, also something to make him sleep.   eICU Interventions  Will give morphine x 1     Intervention Category Intermediate Interventions: Pain - evaluation and management  Onita Pfluger S. 01/28/2016, 2:58 AM

## 2016-01-28 NOTE — Progress Notes (Signed)
NIF-  -40cmh2o VC-   3.5L

## 2016-01-28 NOTE — Progress Notes (Signed)
Physical Therapy Treatment Patient Details Name: Henry Hardin L Pongratz MRN: [id number] DOB: 06/17/1985 Today's Date: 01/28/2016    History of Present Illness Patient is a 30 y/o male with hx of asthma, who has never been consistent with taking his controller medications and has been unable to get albuterol due to insurance issues as well as a smoker (1 pack per day) presents with asthma exacerbation and went into respiratory distress s/p trach.    PT Comments    Patient seen for mobility progression and exercise. At this time, patient with significant improvements in overall mobility. Ambulated in hall with use of RW then tolerated multiple sets of LE strengthening exercises. Educated patient on activities he can perform with assist. Will continue to see and progress as tolerated.  Follow Up Recommendations  No PT follow up;Supervision/Assistance - 24 hour     Equipment Recommendations  Rolling walker with 5" wheels    Recommendations for Other Services       Precautions / Restrictions Precautions Precautions: Fall Restrictions Weight Bearing Restrictions: No    Mobility  Bed Mobility Overal bed mobility: Needs Assistance Bed Mobility: Supine to Sit     Supine to sit: Supervision     General bed mobility comments: no physical assist required  Transfers Overall transfer level: Needs assistance Equipment used: Rolling walker (2 wheeled) Transfers: Sit to/from Stand Sit to Stand: Min guard         General transfer comment: pt able to stand at EOB without physical assist, however with decreased balance, weakness in LEs  Ambulation/Gait Ambulation/Gait assistance: Min guard Ambulation Distance (Feet): 160 Feet Assistive device: Rolling walker (2 wheeled) Gait Pattern/deviations: Step-through pattern;Decreased stride length;Drifts right/left Gait velocity: decreased Gait velocity interpretation: Below normal speed for age/gender General Gait Details: some modest  instability noted, cues for increased cadence and upright posture.    Stairs            Wheelchair Mobility    Modified Rankin (Stroke Patients Only)       Balance Overall balance assessment: Needs assistance Sitting-balance support: Feet supported Sitting balance-Leahy Scale: Fair       Standing balance-Leahy Scale: Poor (dynamic) Standing balance comment: pt balance has greatly improved from last session, however still requires min guard A for safety and pt moving with slow pace                    Cognition Arousal/Alertness: Awake/alert   Overall Cognitive Status: Within Functional Limits for tasks assessed                      Exercises Other Exercises Other Exercises: performed rapid sit <> stand 3 sets of 5 at low height, then 1 set of 10 at elevated height    General Comments        Pertinent Vitals/Pain Pain Assessment: No/denies pain Pain Intervention(s): Monitored during session    Home Living                      Prior Function            PT Goals (current goals can now be found in the care plan section) Acute Rehab PT Goals Patient Stated Goal: go home PT Goal Formulation: With patient Time For Goal Achievement: 02/07/16 Potential to Achieve Goals: Good Progress towards PT goals: Progressing toward goals    Frequency    Min 4X/week (increased frequency due to change in disposition)  PT Plan Discharge plan needs to be updated    Co-evaluation PT/OT/SLP Co-Evaluation/Treatment: Yes Reason for Co-Treatment:  (patient doing well, transitioned to dove tail)         End of Session Equipment Utilized During Treatment: Gait belt Activity Tolerance: Patient tolerated treatment well Patient left: in bed;with call bell/phone within reach     Time: 1248-1311 PT Time Calculation (min) (ACUTE ONLY): 23 min  Charges:  $Gait Training: 8-22 mins                    G Codes:      Fabio AsaDevon J  Everhett Bozard 01/28/2016, 1:53 PM Charlotte Crumbevon Jalayiah Bibian, PT DPT  [phone]

## 2016-01-28 NOTE — Progress Notes (Signed)
PULMONARY / CRITICAL CARE MEDICINE   Name: Enzo BiReegius L Lapre MRN: [id number] DOB: 06/18/1985    ADMISSION DATE:  01/09/2016 CONSULTATION DATE:  01/10/2016  REFERRING MD:  Dr. Ethelda ChickJacubowitz  CHIEF COMPLAINT:  SOB  HISTORY OF PRESENT ILLNESS:   30 year old male with PMH asthma since childhood. He has never been consistent with taking his controller medications and has been unable to get albuterol due to insurance issues per mother. He has been smoking about a pack per day for the past 6 years during which time his symptoms have  worsened >> seen in the ED 8 times in the past 6 months with exacerbations of asthma. He has been intubated once in the past. 11/22 he presented to the ED with CC respiratory distress for about 3 hours. He was treated with mag, albuterol, and atrovent by EMS. He was additionally treated with solumedrol, and additional bronchodilators in ED without improvement. His mental status began to worsen and he required intubation. PCCM asked to admit.   SUBJECTIV No c/o.  Wants to go to CIR.  Walked with assistance and walker to nurses station.  Tolerated trach capped overnight.   VITAL SIGNS: BP 124/73   Pulse (!) 57   Temp 97.9 F (36.6 C) (Oral)   Resp 12   Ht 6\' 3"  (1.905 m)   Wt 73.4 kg (161 lb 13.1 oz)   SpO2 99%   BMI 20.23 kg/m   FIO2  RA/ sats 99% on rounds  HEMODYNAMICS:    VENTILATOR SETTINGS:    INTAKE / OUTPUT: I/O last 3 completed shifts: In: 10 [I.V.:10] Out: 2200 [Urine:2200]  PHYSICAL EXAMINATION: General:  Young adult male nad Neuro: awake, oriented. No focal def  HEENT:  Trach wnl, excellent phonation w capped #6 trach  Cardiovascular:  s1s2 regular, no MRG Lungs:good cough, lungs clear  Abdomen:  Soft, non-tender, non-distended, + bs, tol PO diet  Musculoskeletal:  No acute deformity or ROM limitation Skin:  Grossly intact  LABS:  BMET  Recent Labs Lab 01/24/16 1951 01/25/16 0400 01/26/16 0357  NA 137 139 135  K 3.4* 3.1* 3.3*   CL 107 104 102  CO2 22 25 25   BUN 26* 24* 19  CREATININE 0.72 0.68 0.73  GLUCOSE 149* 96 118*    Electrolytes  Recent Labs Lab 01/24/16 1951 01/25/16 0400 01/26/16 0357  CALCIUM 8.6* 8.7* 8.4*  MG  --   --  1.8    CBC  Recent Labs Lab 01/23/16 0644  WBC 18.4*  HGB 11.9*  HCT 36.9*  PLT 229    Coag's  Recent Labs Lab 01/23/16 0644  APTT 25  INR 1.12    Sepsis Markers No results for input(s): LATICACIDVEN, PROCALCITON, O2SATVEN in the last 168 hours.  ABG No results for input(s): PHART, PCO2ART, PO2ART in the last 168 hours.  Liver Enzymes  Recent Labs Lab 01/22/16 0500  AST 88*  ALT 126*  ALKPHOS 57  BILITOT 0.6  ALBUMIN 2.6*    Cardiac Enzymes No results for input(s): TROPONINI, PROBNP in the last 168 hours.  Glucose  Recent Labs Lab 01/27/16 1236 01/27/16 1622 01/27/16 2037 01/28/16 0012 01/28/16 0403 01/28/16 0809  GLUCAP 222* 224* 276* 151* 150* 119*    Imaging No results found.   STUDIES:    CULTURES: MRSA (-) Trache asp 11/24 > (-) Blood 11/24 (-) Trach 11/26  > MSSA  ANTIBIOTICS: Azithro 11/24 > 11/26 Zosyn 11/26 >> 11/28 vanco x 1 11/28 Ancef 11/28 >>  SIGNIFICANT EVENTS: 11/22  intubated for asthma 11/24  air trapping 11/28  paralysis off 12/2 failed extubation 12/5 #8 shiley (DF) 12/10 #6 requested cuffless with plug trial   LINES/TUBES: ETT 11/23 >>12/2 promptly failed and re intubated 12/2>>12/5 #8 Shiley 12/5>>12/10 RUE PICC 11/26 >>  DISCUSSION: 30 year old male smoker with asthma since childhood, poorly compliant r/t cost. Frequent visits/admissions.  Admitted 11/23 with status asthmaticus requiring intubation. Has required NMB. Trached 12/5. Off vent. 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 Unlikely to get CIR bed.  See discussion below.   ASSESSMENT / PLAN:  Acute hypercarbic respiratory failure secondary to status asthmaticus:  No focal opacification on CXR.   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 critical illnes (paralysis, steroids)-- trached 12/5 Plan: pred taper - change to 30mg /day 12/12 dulera bid, PRN albuterol  Discussed smoking cessation at length  Aggressive PT Continue trach capped - can likely d/c in am if continues to tolerate    Acute metabolic encephalopathy At Risk Deconditioning  Suspect CIP Plan: Aggressive PT See below   Hypokalemia  Plan Replace PRN  MSSA PNA plan S/p 14 days Ancef   Constipation At Risk Malnutrition  plan Tolerating PO diet   Best practice VTE ppx: SCDs and SQ heparin Diet regular     Discussed with CIR 12/11 -- unlikely to get bed today or tomorrow and likely by then will no longer qualify for CIR based on quick progress -- has no insurance so cannot do SNF rehab or home PT.  Will plan to tx to floor with aggressive PT over next 1-2 days, then likely decannulate and d/c home   Dirk DressKaty Whiteheart, NP 01/28/2016  10:28 AM Pager: [phone] or [phone]

## 2016-01-28 NOTE — Progress Notes (Signed)
Inpatient Rehabilitation  Pt. has made tremendous progress over the weekend and no longer requires the intensity of CIR level therapies and medical management.  He is now ambulating at min guard assist level with RW and PT is no longer recommending any follow up PT services.  I have discussed with the patient and he said he understands that he does not have need for IP rehab.  I will sign off.  Please call if questions.  Weldon PickingSusan Genevieve Ritzel PT Inpatient Rehab Admissions Coordinator Cell [phone] Office [phone]

## 2016-01-29 LAB — BASIC METABOLIC PANEL
Anion gap: 5 (ref 5–15)
BUN: 7 mg/dL (ref 6–20)
CALCIUM: 8.9 mg/dL (ref 8.9–10.3)
CHLORIDE: 100 mmol/L — AB (ref 101–111)
CO2: 31 mmol/L (ref 22–32)
CREATININE: 0.82 mg/dL (ref 0.61–1.24)
GFR calc non Af Amer: >60 mL/min (ref >60)
GLUCOSE: 122 mg/dL — AB (ref 65–99)
Potassium: 3.2 mmol/L — ABNORMAL LOW (ref 3.5–5.1)
Sodium: 136 mmol/L (ref 135–145)

## 2016-01-29 LAB — CBC WITH DIFFERENTIAL/PLATELET
BASOS PCT: 0 %
Basophils Absolute: 0 10*3/uL (ref 0.0–0.1)
Eosinophils Absolute: 0.1 10*3/uL (ref 0.0–0.7)
Eosinophils Relative: 1 %
HEMATOCRIT: 35.8 % — AB (ref 39.0–52.0)
Hemoglobin: 12 g/dL — ABNORMAL LOW (ref 13.0–17.0)
LYMPHS ABS: 3.3 10*3/uL (ref 0.7–4.0)
Lymphocytes Relative: 38 %
MCH: 29.9 pg (ref 26.0–34.0)
MCHC: 33.5 g/dL (ref 30.0–36.0)
MCV: 89.3 fL (ref 78.0–100.0)
MONO ABS: 0.4 10*3/uL (ref 0.1–1.0)
MONOS PCT: 5 %
NEUTROS ABS: 4.8 10*3/uL (ref 1.7–7.7)
Neutrophils Relative %: 56 %
Platelets: 337 10*3/uL (ref 150–400)
RBC: 4.01 MIL/uL — ABNORMAL LOW (ref 4.22–5.81)
RDW: 13.4 % (ref 11.5–15.5)
WBC: 8.6 10*3/uL (ref 4.0–10.5)

## 2016-01-29 LAB — GLUCOSE, CAPILLARY
GLUCOSE-CAPILLARY: 77 mg/dL (ref 65–99)
Glucose-Capillary: 117 mg/dL — ABNORMAL HIGH (ref 65–99)
Glucose-Capillary: 145 mg/dL — ABNORMAL HIGH (ref 65–99)
Glucose-Capillary: 195 mg/dL — ABNORMAL HIGH (ref 65–99)

## 2016-01-29 LAB — MAGNESIUM: MAGNESIUM: 1.8 mg/dL (ref 1.7–2.4)

## 2016-01-29 LAB — PHOSPHORUS: Phosphorus: 3.7 mg/dL (ref 2.5–4.6)

## 2016-01-29 MED ORDER — PREDNISONE 10 MG PO TABS: ORAL_TABLET | ORAL | 0 refills | Status: DC

## 2016-01-29 MED ORDER — FLUTICASONE-SALMETEROL 250-50 MCG/DOSE IN AEPB: 1.0000 | INHALATION_SPRAY | Freq: Two times a day (BID) | RESPIRATORY_TRACT | 12 refills | Status: DC

## 2016-01-29 MED ORDER — ALBUTEROL SULFATE HFA 108 (90 BASE) MCG/ACT IN AERS: 2.0000 | INHALATION_SPRAY | Freq: Four times a day (QID) | RESPIRATORY_TRACT | 1 refills | Status: DC | PRN

## 2016-01-29 NOTE — Care Management Note (Signed)
Case Management Note  Patient Details  Name: Henry Hardin MRN: [id number] Date of Birth: 10/11/1985  Subjective/Objective:     CM continuing to follow for progression and d/c planning.                Action/Plan: 01/29/2016 Pt decannulatedd and ready for d/c to home today. MATCH letter prepared and given to pt and significant other. Use of letter explained in detail. Walker requested as  charity DME, [facility] given pt info to qualify for charity assistance. Pt will d/c to home with 24/7 assistance from significant other.   Expected Discharge Date:    01/29/2016              Expected Discharge Plan:  Home/Self Care  In-House Referral:     Discharge planning Services  CM Consult, [facility] Program  Post Acute Care Choice:  Durable Medical Equipment Choice offered to:  Patient  DME Arranged:  Walker rolling DME Agency:  [facility]  HH Arranged:  NA HH Agency:  NA  Status of Service:  Completed, signed off  If discussed at Long Length of Stay Meetings, dates discussed:    Additional Comments:  Starlyn SkeansRoyal, Tarrance Januszewski U, RN 01/29/2016, 12:36 PM

## 2016-01-29 NOTE — Progress Notes (Signed)
01/29/2016- Respiratory care note-  Patient decannulated this am by MD.  Checked patient to see if any education needed.  Pt aware of occlusive dressing and stated that he does not need any further education.  Stated that MD reviewed care with patient this am.  Clean occlusive dressing in place and no air leak noted around dressing at time of evaluation.  Pt resting comfortably and vocalizing during evaluation. No further follow-up indicated at time of evaluation.

## 2016-01-29 NOTE — Progress Notes (Signed)
Physical Therapy Treatment Patient Details Name: Henry Hardin MRN: [id number] DOB: 12/19/1985 Today's Date: 01/29/2016    History of Present Illness Patient is a 30 y/o male with hx of asthma, who has never been consistent with taking his controller medications and has been unable to get albuterol due to insurance issues as well as a smoker (1 pack per day) presents with asthma exacerbation and went into respiratory distress s/p trach. 12/12 decanulated.    PT Comments    Much improved stability as seen with pt ambulating around the bed unassisted.  Needed RW for progressing out into the hall.  Gait steady with RW, but shuffled and worsened with fatigue.  Discussed with significant other and pt that he really needs to progress his ambulation, run errands, get out and about.    Follow Up Recommendations  No PT follow up;Supervision/Assistance - 24 hour     Equipment Recommendations  Rolling walker with 5" wheels    Recommendations for Other Services       Precautions / Restrictions Precautions Precautions: Fall    Mobility  Bed Mobility Overal bed mobility: Modified Independent                Transfers Overall transfer level: Needs assistance Equipment used: None;Rolling walker (2 wheeled) Transfers: Sit to/from Stand Sit to Stand: Supervision         General transfer comment: pt stood and walked to the RW around the bed with relative ease.  Ambulation/Gait Ambulation/Gait assistance: Supervision Ambulation Distance (Feet): 200 Feet Assistive device: Rolling walker (2 wheeled) Gait Pattern/deviations: Step-through pattern;Shuffle Gait velocity: decreased Gait velocity interpretation: Below normal speed for age/gender General Gait Details: shuffled gait rather that a steady heel toe pattern.  Could be due to flip/flop bedroom shoes vs fatigue.   Stairs Stairs:  (pt deferred the stairs)          Wheelchair Mobility    Modified Rankin (Stroke  Patients Only)       Balance Overall balance assessment: Needs assistance   Sitting balance-Leahy Scale: Good       Standing balance-Leahy Scale: Fair                      Cognition Arousal/Alertness: Awake/alert Behavior During Therapy: WFL for tasks assessed/performed Overall Cognitive Status: Within Functional Limits for tasks assessed                      Exercises      General Comments        Pertinent Vitals/Pain Pain Assessment: Faces Faces Pain Scale: No hurt    Home Living                      Prior Function            PT Goals (current goals can now be found in the care plan section) Acute Rehab PT Goals Patient Stated Goal: go home PT Goal Formulation: With patient Time For Goal Achievement: 02/07/16 Potential to Achieve Goals: Good Progress towards PT goals: Progressing toward goals    Frequency           PT Plan Current plan remains appropriate    Co-evaluation             End of Session   Activity Tolerance: Patient tolerated treatment well Patient left: in bed;with call bell/phone within reach (sitting EOB to eat lunch)     Time: [id number] PT Time  Calculation (min) (ACUTE ONLY): 12 min  Charges:  $Gait Training: 8-22 mins                    G CodesEliseo Gum:      Patrese Neal V Sianne Tejada 01/29/2016, 1:03 PM 01/29/2016   BingKen Gaelyn Tukes, PT [phone]  (pager)

## 2016-01-29 NOTE — Progress Notes (Signed)
Occupational Therapy Treatment Patient Details Name: Henry Hardin MRN: [id number] DOB: 1985/03/20 Today's Date: 01/29/2016    History of present illness Patient is a 30 y/o male with hx of asthma, who has never been consistent with taking his controller medications and has been unable to get albuterol due to insurance issues as well as a smoker (1 pack per day) presents with asthma exacerbation and went into respiratory distress s/p trach. 12/12 decanulated.   OT comments  Completed education regarding ADL, mobility and reducing risk of falls. Goals met. Pt will have 24/7 S as needed. Ot signing off.   Follow Up Recommendations  No OT follow up;Supervision/Assistance - 24 hour (initially)    Equipment Recommendations  None recommended by OT    Recommendations for Other Services      Precautions / Restrictions Precautions Precautions: Fall       Mobility Bed Mobility Overal bed mobility: Modified Independent                Transfers Overall transfer level: Needs assistance Equipment used: Rolling walker (2 wheeled) Transfers: Sit to/from Stand Sit to Stand: Supervision              Balance Overall balance assessment: Needs assistance           Standing balance-Leahy Scale: Fair                     ADL                                       Functional mobility during ADLs: Supervision/safety;Rolling walker General ADL Comments: Discussed reducing risk of falls and energy conservation wtih girlfriend and pt. Pt verbalized understanding. Pt states someone will be with him to help him as needed.       Vision                     Perception     Praxis      Cognition   Behavior During Therapy: WFL for tasks assessed/performed Overall Cognitive Status: Within Functional Limits for tasks assessed                       Extremity/Trunk Assessment               Exercises     Shoulder Instructions        General Comments      Pertinent Vitals/ Pain       Pain Assessment: No/denies pain  Home Living                                          Prior Functioning/Environment              Frequency           Progress Toward Goals  OT Goals(current goals can now be found in the care plan section)  Progress towards OT goals: Goals met/education completed, patient discharged from OT  Acute Rehab OT Goals Patient Stated Goal: go home OT Goal Formulation: With patient ADL Goals Pt Will Perform Grooming: with min guard assist;sitting Pt Will Perform Upper Body Bathing: with mod assist;sitting Pt Will Perform Lower Body Bathing: with max assist;with mod assist;sitting/lateral leans Pt Will Perform Upper Body  Dressing: with mod assist;sitting Pt Will Transfer to Toilet: with mod assist;bedside commode;stand pivot transfer Additional ADL Goal #1: Pt will tolerate B UE strenghtening exercises with level 2 theraband Additional ADL Goal #2: Pt will sit EOB x 8 minutes with min A - min guard A for balance/support for grooming and UB ADL tasks  Plan All goals met and education completed, patient discharged from OT services    Co-evaluation                 End of Session     Activity Tolerance Patient tolerated treatment well   Patient Left in bed;with call bell/phone within reach;with family/visitor present   Nurse Communication Mobility status        Time: 7062-3762 OT Time Calculation (min): 12 min  Charges: OT General Charges $OT Visit: 1 Procedure OT Treatments $Self Care/Home Management : 8-22 mins  ,HILLARY 01/29/2016, 12:20 PM  [facility], OT/L  [phone] 01/29/2016

## 2016-01-29 NOTE — Discharge Summary (Signed)
Physician Discharge Summary       Patient ID: Henry Hardin MRN: [id number] DOB/AGE: 28/07/1985 28 y.o.  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 He has never been consistent with taking his controller medications and has been unable to get albuterol due to insurance issues per mother. He has been smoking about a pack per day for the past 6 years during which time his symptoms have  worsened >> seen in the ED 8 times in the past 6 months with exacerbations of asthma. He has been intubated once in the past. 11/22 he presented to the ED with CC respiratory distress for about 3 hours. He was treated with mag, albuterol, and atrovent by EMS. He was additionally treated with solumedrol, and additional bronchodilators in ED without improvement. His mental status began to worsen and he required intubation. PCCM asked to admit.  Significant events/high-lights: 11/22  intubated for asthma 11/24  air trapping-->started NMB 11/28  paralysis off 12/2 failed extubation 12/5 #8 shiley (DF) 12/10 #6 requested cuffless with plug trial  12/11 deemed to have recovered to rapidly to need CIR 12/12 decannulated.    Discharge Plan by active problems   Tracheostomy status s/p prolonged critical illness.  -->decannulated 12/12 Plan: Occlusive dressing x 48 hrs Then daily dressing change til stoma closed.   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 No focal def  HEENT:  Trach site now dressed w/ occlusive dressing. Excellent phonation.  Cardiovascular:  s1s2 regular, no MRG Lungs:good cough, lungs clear  Abdomen:  Soft, non-tender, non-distended, + bs, tol PO diet  Musculoskeletal:  No acute deformity or ROM limitation Skin:  Grossly intact   Labs at discharge Lab Results  Component Value Date   CREATININE 0.82 01/29/2016   BUN 7 01/29/2016   NA 136 01/29/2016   K 3.2 (L) 01/29/2016   CL 100 (L) 01/29/2016   CO2 31 01/29/2016   Lab Results  Component Value Date   WBC 8.6 01/29/2016   HGB 12.0 (L) 01/29/2016   HCT 35.8 (L) 01/29/2016   MCV 89.3 01/29/2016   PLT 337 01/29/2016   Lab Results  Component Value Date   ALT 126 (H) 01/22/2016   AST 88 (H) 01/22/2016   ALKPHOS 57 01/22/2016   BILITOT 0.6 01/22/2016   Lab Results  Component Value Date   INR 1.12 01/23/2016   INR 1.10 01/21/2016   INR 1.10 01/21/2016    Current radiology studies No results found.  Disposition:  07-Left Against Medical  Advice/Left Without Being Seen/Elopement  Discharge Instructions    Diet - low sodium heart healthy    Complete by:  As directed    Discharge instructions    Complete by:  As directed    Keep current trach dressing in place for 2 days Then may clean w/ soap and water Do not submerge underwater until trach completely closed.  Daily dressing change to trach until closed off   Increase activity slowly    Complete by:  As directed        Medication List    STOP  taking these medications   cetirizine-pseudoephedrine 5-120 MG tablet Commonly known as:  ZYRTEC-D   clindamycin 300 MG capsule Commonly known as:  CLEOCIN   diazepam 5 MG tablet Commonly known as:  VALIUM   guaiFENesin 100 MG/5ML liquid Commonly known as:  ROBITUSSIN   guaiFENesin-codeine 100-10 MG/5ML syrup   HYDROcodone-acetaminophen 5-325 MG tablet Commonly known as:  NORCO     TAKE these medications   acetaminophen 500 MG tablet Commonly known as:  TYLENOL Take 500-1,000 mg by mouth every 6 (six) hours as needed (for pain).   albuterol 108 (90 Base) MCG/ACT inhaler Commonly known as:  PROAIR HFA Inhale 2 puffs into the lungs every 6 (six) hours as needed for wheezing or shortness of breath. What changed:  Another medication with the same name was removed. Continue taking this medication, and follow the directions you see here.   Fluticasone-Salmeterol 250-50 MCG/DOSE Aepb Commonly known as:  ADVAIR DISKUS Inhale 1 puff into the lungs 2 (two) times daily.   ibuprofen 200 MG tablet Commonly known as:  ADVIL,MOTRIN Take 200-800 mg by mouth every 6 (six) hours as needed (for pain).   predniSONE 10 MG tablet Commonly known as:  DELTASONE Take 3 tabs daily x3d, then 2 tab daily x3 d then 1 tab daily x 3d and stop What changed:  medication strength  how much to take  how to take this  when to take this  additional instructions            Durable Medical Equipment        Start     Ordered   01/29/16 1135  For home use only DME Walker rolling  Once    Question:  Patient needs a walker to treat with the following condition  Answer:  Muscular deconditioning   01/29/16 1134     Follow-up Information    Rubye Oaksammy Parrett, NP Follow up on 02/07/2016.   Specialty:  Pulmonary Disease Why:  415pm  Contact information: [address] [place] [address] [phone]        Lawanda CousinsJennings Nestor, MD Follow up on 03/25/2016.   Specialty:  Pulmonary  Disease Why:  11am Contact information: [address] [place] [address] [phone]           Discharged Condition: good  Physician Statement:   The Patient was personally examined, the discharge assessment and plan has been personally reviewed and I agree with ACNP Babcock's assessment and plan. > 30 minutes of time have been dedicated to discharge assessment, planning and discharge instructions.   Signed: Shelby Mattocksete E Babcock 01/29/2016, 11:39 AM  Patient seen and examined, agree with above note.  I dictated the care and orders written for this patient under my direction.  Alyson ReedyWesam G Zuriel Roskos, MD [phone]

## 2016-01-29 NOTE — Progress Notes (Signed)
Speech Language Pathology Treatment: Dysphagia  Patient Details Name: Henry Hardin MRN: [id number] DOB: 07/16/1985 Today's Date: 01/29/2016 Time: 1052-1100 SLP Time Calculation (min) (ACUTE ONLY): 8 min  Assessment / Plan / Recommendation Clinical Impression  Pt tolerating capped trach for several days; decannulated per Jerrye Bushy during visit; pt still with relatively strong phonation with covered stoma. Observed with consumption of thin liquids, which were tolerated well with no s/s of aspiration.  Pt's swallowing/speech issues have resolved; goals met; no further SLP f/u warranted.  Will sign off.    HPI HPI: 30 year old with asthma since childhood. Per chart poorly compliant-history obtained from mother (7 ED visits in the last 6 months). Other PMH:  intubated 2 years ago, continues to smoke about a pack per day. Admitted with  asthma exacerbation and respiratory distress. Intubated 11/24 and trach'd 12/5 (extubated and reintubated immediately after); initiated TC 12/7.      SLP Plan    no further SLP f/u    Recommendations   regular diet, thin liquids.                        GO                Henry Hardin 01/29/2016, 11:03 AM

## 2016-01-29 NOTE — Progress Notes (Signed)
Vital capacity: 2.1L  NIF: -40  With good pt. Effort.

## 2016-02-06 ENCOUNTER — Inpatient Hospital Stay: Payer: Self-pay

## 2016-02-07 ENCOUNTER — Ambulatory Visit (INDEPENDENT_AMBULATORY_CARE_PROVIDER_SITE_OTHER): Payer: Self-pay | Admitting: Adult Health

## 2016-02-07 ENCOUNTER — Encounter: Payer: Self-pay | Admitting: Adult Health

## 2016-02-07 VITALS — BP 120/86 | HR 97 | Ht 74.0 in | Wt 171.8 lb

## 2016-02-07 DIAGNOSIS — J4542 Moderate persistent asthma with status asthmaticus: Secondary | ICD-10-CM

## 2016-02-07 DIAGNOSIS — Z430 Encounter for attention to tracheostomy: Secondary | ICD-10-CM

## 2016-02-07 LAB — NITRIC OXIDE: NITRIC OXIDE: 5

## 2016-02-07 MED ORDER — FLUTICASONE-SALMETEROL 250-50 MCG/DOSE IN AEPB: 1.0000 | INHALATION_SPRAY | Freq: Two times a day (BID) | RESPIRATORY_TRACT | 5 refills | Status: DC

## 2016-02-07 NOTE — Assessment & Plan Note (Signed)
Recent severe exacerbation with intubation Janina Mayo/trach now improving .  Finances/cost are issues. He believes medicaid will be approved now. rx sent to pharmacy  Sample of Advair given today -lower dose as we do not have any other aware to use this only If insurance not approved . Call our office if no insurance as will need to apply for pt assistance program  Plan  Patient Instructions  Continue on Advair 1 puff Twice daily  , rinse after use.  Use ProAir 2 puffs every 4hr as needed , this is your rescue inhaler  We are going to set you up for the trach clinic .  Follow up Dr. Sherene SiresWert  In 3 weeks and As needed

## 2016-02-07 NOTE — Progress Notes (Signed)
Chart and office note reviewed in detail  > agree with a/p as outlined    

## 2016-02-07 NOTE — Patient Instructions (Addendum)
Continue on Advair 1 puff Twice daily  , rinse after use.  Use ProAir 2 puffs every 4hr as needed , this is your rescue inhaler  We are going to set you up for the trach clinic .  Follow up Dr. Sherene SiresWert  In 3 weeks and As needed

## 2016-02-07 NOTE — Assessment & Plan Note (Signed)
S/p trach . Stoma is healing but remains open . Cont w/ trach stoma care. Will need trach clinic evauation to assess suture as unable to remove without sign discomfort.

## 2016-02-07 NOTE — Progress Notes (Signed)
@Patient  ID: Henry Hardin, male    DOB: 1985-10-17, 30 y.o.   MRN: [id number]  No chief complaint on file.   Referring provider: No ref. provider found  HPI: 30 year old male smoker with past medical history of asthma seen for pulmonary consult during hospitalization. 01/09/2016 for respiratory failure secondary to status asthmaticus w/ critical illness w/ vent support/-trach   TEST /Events  Intubated 01/10/16  Trach 12/5>Decanulated 12/10  02/07/2016 Follow up ; Status Asthmaticus  Patient returns for a post hospital follow-up. Patient was recently admitted for critical illness with severe asthmatic exacerbation. She had acute hypercarbic respiratory failure secondary to status asthmaticus, MSSA bronchitis. Prior to admission. Patient had been seen multiple times in the emergency room for asthma exacerbations. Patient presented in respiratory distress on 01/09/2016 despite aggressive treatments. Patient continued to decompensate. And required intubation. Patient required prolonged ventilatory support . Failed weaning. He required a tracheostomy 12/5  and was successfully weaned ant then decannulated on December 12. Since discharge he is feeling  He is on Advair . Finished prednisone taper today .  Spirometry today shows FEV1 82%, ratio 71, FVC 96%.  Exhaled nitric oxide today is  5 .  Not sure if he has insurance , trying to get Medicaid.  Got meds thru [facility] program.  He is feeling much better . No wheezing or cough  Strength and energy are returning  Trach stoma is getting smaller and voice is getting stronger.       Allergies  Allergen Reactions  . Bee Venom Anaphylaxis, Itching and Swelling    There is no immunization history for the selected administration types on file for this patient.  Past Medical History:  Diagnosis Date  . Asthma   . Pneumonia     Tobacco History: History  Smoking Status  . Former Smoker  . Packs/day: 0.75  . Years: 13.00  . Types:  Cigarettes  . Quit date: 01/09/2016  Smokeless Tobacco  . Never Used   Counseling given: Not Answered   Outpatient Encounter Prescriptions as of 02/07/2016  Medication Sig  . acetaminophen (TYLENOL) 500 MG tablet Take 500-1,000 mg by mouth every 6 (six) hours as needed (for pain).  Marland Kitchen albuterol (PROAIR HFA) 108 (90 Base) MCG/ACT inhaler Inhale 2 puffs into the lungs every 6 (six) hours as needed for wheezing or shortness of breath.  . Fluticasone-Salmeterol (ADVAIR DISKUS) 250-50 MCG/DOSE AEPB Inhale 1 puff into the lungs 2 (two) times daily.  Marland Kitchen ibuprofen (ADVIL,MOTRIN) 200 MG tablet Take 200-800 mg by mouth every 6 (six) hours as needed (for pain).  . predniSONE (DELTASONE) 10 MG tablet Take 3 tabs daily x3d, then 2 tab daily x3 d then 1 tab daily x 3d and stop   No facility-administered encounter medications on file as of 02/07/2016.      Review of Systems  Constitutional:   No  weight loss, night sweats,  Fevers, chills, fatigue, or  lassitude.  HEENT:   No headaches,  Difficulty swallowing,  Tooth/dental problems, or  Sore throat,                No sneezing, itching, ear ache, nasal congestion, post nasal drip,   CV:  No chest pain,  Orthopnea, PND, swelling in lower extremities, anasarca, dizziness, palpitations, syncope.   GI  No heartburn, indigestion, abdominal pain, nausea, vomiting, diarrhea, change in bowel habits, loss of appetite, bloody stools.   Resp: No shortness of breath with exertion or at rest.  No excess mucus,  no productive cough,  No non-productive cough,  No coughing up of blood.  No change in color of mucus.  No wheezing.  No chest wall deformity  Skin: no rash or lesions.  GU: no dysuria, change in color of urine, no urgency or frequency.  No flank pain, no hematuria   MS:  No joint pain or swelling.  No decreased range of motion.  No back pain.    Physical Exam  BP 120/86 (BP Location: Left Arm, Cuff Size: Normal)   Pulse 97   Ht 6\' 2"  (1.88 m)    Wt 171 lb 12.8 oz (77.9 kg)   SpO2 98%   BMI 22.06 kg/m   GEN: A/Ox3; pleasant , NAD, well nourished    HEENT:  [place]/AT,  EACs-clear, TMs-wnl, NOSE-clear, THROAT-clear, no lesions, no postnasal drip or exudate noted.   NECK:  Supple w/ fair ROM; no JVD; normal carotid impulses w/o bruits; no thyromegaly or nodules palpated; no lymphadenopathy.   Trach stoma remains open , pink , no redness , small suture remains   RESP  Clear  P & A; w/o, wheezes/ rales/ or rhonchi. no accessory muscle use, no dullness to percussion  CARD:  RRR, no m/r/g, no peripheral edema, pulses intact, no cyanosis or clubbing.  GI:   Soft & nt; nml bowel sounds; no organomegaly or masses detected.   Musco: Warm bil, no deformities or joint swelling noted.   Neuro: alert, no focal deficits noted.    Skin: Warm, no lesions or rashes  Psych:  No change in mood or affect. No depression or anxiety.  No memory loss.  Lab Results:  Imaging: Dg Chest 2 View  Result Date: 01/09/2016 CLINICAL DATA:  Acute onset of shortness of breath and wheezing. Initial encounter. EXAM: CHEST  2 VIEW COMPARISON:  Chest radiograph from 12/18/2015 FINDINGS: The lungs are well-aerated and clear. There is no evidence of focal opacification, pleural effusion or pneumothorax. The heart is normal in size; the mediastinal contour is within normal limits. No acute osseous abnormalities are seen. IMPRESSION: No acute cardiopulmonary process seen. Electronically Signed   By: Roanna Raider M.D.   On: 01/09/2016 22:04   Dg Chest Port 1 View  Result Date: 01/23/2016 CLINICAL DATA:  Check tracheostomy tube, respiratory failure EXAM: PORTABLE CHEST 1 VIEW COMPARISON:  01/22/2016 FINDINGS: Tracheostomy tube and right-sided PICC line are again noted and stable. The lungs are well aerated bilaterally. No focal infiltrate or sizable effusion is seen. The previously noted left lower lobe collapse has resolved in the interval. IMPRESSION: No acute  abnormality noted. Electronically Signed   By: Alcide Clever M.D.   On: 01/23/2016 08:36   Dg Chest Port 1 View  Result Date: 01/22/2016 CLINICAL DATA:  Tracheostomy EXAM: PORTABLE CHEST 1 VIEW COMPARISON:  Earlier same day FINDINGS: Tracheostomy in place, grossly well positioned. No pneumothorax. Right arm PICC tip in the SVC 5 cm above the right atrium. Persistent left lower lobe atelectasis. IMPRESSION: Tracheostomy grossly well positioned without visible complication. Persistent left lower lobe collapse. Electronically Signed   By: Paulina Fusi M.D.   On: 01/22/2016 11:43   Dg Chest Port 1 View  Result Date: 01/22/2016 CLINICAL DATA:  Intubation EXAM: PORTABLE CHEST 1 VIEW COMPARISON:  Yesterday FINDINGS: Endotracheal tube tip just below the clavicular heads. An orogastric tube reaches the side port. Right upper extremity PICC with tip at the SVC. Unchanged collapse of the left lower lobe. Remainder of the lungs are clear. Normal heart  size. No pneumothorax. IMPRESSION: 1. Tubes and central line are in good position. 2. Continued left lower lobe collapse. Electronically Signed   By: Marnee SpringJonathon  Watts M.D.   On: 01/22/2016 08:18   Dg Chest Port 1 View  Result Date: 01/21/2016 CLINICAL DATA:  30 year old male with respiratory failure. Endotracheal tube advanced. Initial encounter. EXAM: PORTABLE CHEST 1 VIEW COMPARISON:  0620 hours today and earlier. FINDINGS: Portable AP semi upright view at at 0925 hours. Endotracheal tube tip now projects in good position between the level the clavicles and carina. Enteric tube probably also was advanced, the side hole now appears to be distal to the level of the diaphragm. Mediastinal contours remain normal. Left lower lobe collapse or consolidation. No other abnormal pulmonary opacity identified. No pneumothorax. IMPRESSION: 1. Endotracheal tube tip in good position between the clavicles and carina. Visible enteric tube also appears satisfactory. 2. Continued left  lower lobe collapse or consolidation. Electronically Signed   By: Odessa FlemingH  Hall M.D.   On: 01/21/2016 09:34   Dg Chest Port 1 View  Result Date: 01/21/2016 CLINICAL DATA:  Acute respiratory failure, history of asthma, pneumonia, current smoker. EXAM: PORTABLE CHEST 1 VIEW COMPARISON:  Portable chest x-ray of January 20, 2016 FINDINGS: The patient is mildly rotated toward the left. The right lung is mildly hyperinflated and clear. On the left there is lower lobe atelectasis. There is no pneumothorax or pleural effusion. The upper lobe appears hyperinflated. The cardiac silhouette is normal in size. The pulmonary vascularity is not engorged. The esophagogastric tube has its proximal port just below the level of the GE junction. The observed bony thorax exhibits no acute abnormality. IMPRESSION: Dense left lower lobe atelectasis or pneumonia with mild shift of the mediastinum from right to left accentuated by patient rotation. No pneumothorax nor definite pneumomediastinum nor significant pleural effusion. Advancement of the esophagogastric tube by approximately 5-10 cm would assure that the proximal port remains below the GE junction. Electronically Signed   By: David  SwazilandJordan M.D.   On: 01/21/2016 07:38   Dg Chest Port 1 View  Result Date: 01/20/2016 CLINICAL DATA:  ETT. EXAM: PORTABLE CHEST 1 VIEW COMPARISON:  January 19, 2016 FINDINGS: The ET tube has been removed. The NG tube terminates in the left upper quadrant with the side port in the stomach. The right PICC line is stable. The right lung is clear. Lucency is seen along the left heart border, similar to 1 of the studies from yesterday. This air is probably in the mediastinum when reviewing previous studies. A small medial pneumothorax is could demonstrate a similar appearance. No other evidence of pneumothorax. Persistent opacity in the left retrocardiac region with probable small layering effusion on the left, similar in the interval. IMPRESSION: 1. Removal  of ET tube.  Other support apparatus is stable. 2. Continued air along the left heart border which could be mediastinal or pleural. This is unchanged and this result was called to the patient's nurse several times yesterday. 3. Persistent opacity and probable associated effusion in the left base. Electronically Signed   By: Gerome Samavid  Williams III M.D   On: 01/20/2016 07:23   Dg Chest Port 1 View  Result Date: 01/19/2016 CLINICAL DATA:  ET and OG tube placement EXAM: PORTABLE CHEST 1 VIEW COMPARISON:  January 19, 2016 FINDINGS: Opacity remains in the left retrocardiac region. Possible small layering effusion on the left. The ETT is in good position. The side port of the NG tube is just above the GE junction  with the distal tip in the body of the stomach. Mottled lucency over the mediastinum suggest mediastinal air. No definite pneumothorax. The right PICC line is in good position. The right lung is clear. IMPRESSION: 1. The ET is in good position. 2. The side port of the NG tube is just above the GE junction. 3. Suggested mediastinal air. 4. Persistent opacity in the left retrocardiac region with volume loss on the left. I suspect significant collapse/atelectasis of the left lower lobe. Findings called to the patient's nurse, Katie Electronically Signed   By: Gerome Samavid  Williams III M.D   On: 01/19/2016 15:38   Dg Chest Port 1 View  Result Date: 01/19/2016 CLINICAL DATA:  Respiratory failure.  Severe untreated asthma. EXAM: PORTABLE CHEST 1 VIEW COMPARISON:  January 19, 2016 FINDINGS: Lucency along the left heart border is new. Suspected opacity in the left retrocardiac region. Haziness also suggests some layering effusion. The right PICC line is stable. The ETT is in good position. The NG tube terminates below today's film. The hila and mediastinum are unchanged. IMPRESSION: 1. Lucency along the left heart border could represent pneumopericardium, pneumomediastinum, or a small medial pneumothorax. 2. Increasing  density in the region left heart suspected to represent left retrocardiac opacity, possibly from left lower lobe atelectasis or collapse with suspected associated effusion. 3. Support apparatus is stable. Findings called to the patient's nurse, Katie. A CT scan could better evaluate as clinically warranted. Electronically Signed   By: Gerome Samavid  Williams III M.D   On: 01/19/2016 12:57   Dg Chest Port 1 View  Result Date: 01/19/2016 CLINICAL DATA:  Acute respiratory failure. EXAM: PORTABLE CHEST 1 VIEW COMPARISON:  January 17, 2016 FINDINGS: Stable ET tube and right PICC line. New volume loss on the left with opacity in left retrocardiac region consistent with significant left lower lobe collapse. This is an acute finding and may represent sequela mucous plugging or atelectasis. Recommend attention on short-term follow-up. The right lung is clear. The cardiomediastinal silhouette is otherwise unremarkable. IMPRESSION: Significant interval atelectasis/ collapse of the left lower lobe with volume loss on the left. This was not present January 17, 2016 and may represent sequela of mucus plugging. Recommend short-term follow-up PA and lateral chest x-ray to ensure resolution. These results will be called to the ordering clinician or representative by the Radiologist Assistant, and communication documented in the PACS or zVision Dashboard. Electronically Signed   By: Gerome Samavid  Williams III M.D   On: 01/19/2016 08:53   Dg Chest Port 1 View  Result Date: 01/17/2016 CLINICAL DATA:  Hypoxia EXAM: PORTABLE CHEST 1 VIEW COMPARISON:  January 16, 2016 FINDINGS: Endotracheal tube tip is 5.2 cm above the carina. Central catheter tip is in the superior vena cava. Nasogastric tube tip and side port below the diaphragm. No pneumothorax. There is no edema or consolidation. Heart is upper normal in size with pulmonary vascularity within normal limits. No adenopathy. No bone lesions. IMPRESSION: Tube and catheter positions as  described without pneumothorax. No edema or consolidation. Electronically Signed   By: Bretta BangWilliam  Woodruff III M.D.   On: 01/17/2016 07:32   Dg Chest Port 1 View  Result Date: 01/16/2016 CLINICAL DATA:  Acute respiratory failure. History of asthma, current smoker. EXAM: PORTABLE CHEST 1 VIEW COMPARISON:  Portable chest x-ray of January 13, 2016 FINDINGS: The patient is slightly rotated on today's study. The lungs are well-expanded. The interstitial markings on the left have become more conspicuous especially inferiorly. No discrete alveolar infiltrate is observed. The  cardiac silhouette is top-normal in size. The pulmonary vascularity is not clearly engorged. The endotracheal tube tip lies approximately 5.4 cm above the carina. The right-sided PICC line tip projects over the midportion of the SVC. The esophagogastric tube tip projects below the inferior margin of the image. IMPRESSION: Persistent mild hyperinflation. Left lower lobe subsegmental atelectasis. The support tubes are in reasonable position. Electronically Signed   By: David  [place] M.D.   On: 01/16/2016 07:49   Dg Chest Port 1 View  Result Date: 01/13/2016 CLINICAL DATA:  Oxygen desaturation. EXAM: PORTABLE CHEST 1 VIEW COMPARISON:  Radiographs of January 12, 2016. FINDINGS: The heart size and mediastinal contours are within normal limits. Endotracheal and nasogastric tubes are in good position. Interval placement of right-sided PICC line with distal tip in expected position of the SVC. Both lungs are clear. The visualized skeletal structures are unremarkable. IMPRESSION: Stable support apparatus. Interval placement of right-sided PICC line with tip in expected position of the SVC. No acute cardiopulmonary abnormality seen. Electronically Signed   By: Lupita Raider, M.D.   On: 01/13/2016 21:54   Dg Chest Port 1 View  Result Date: 01/12/2016 CLINICAL DATA:  Respiratory failure EXAM: PORTABLE CHEST 1 VIEW COMPARISON:  01/09/2016 chest  radiograph. FINDINGS: Endotracheal tube tip is 4.2 cm above the carina. Enteric tube enters stomach with the tip not seen on this image, with the side port near the esophagogastric junction. Stable cardiomediastinal silhouette with normal heart size. No pneumothorax. No pleural effusion. Lungs appear clear, with no acute consolidative airspace disease and no pulmonary edema. IMPRESSION: 1. Support structures as described. 2. No active cardiopulmonary disease. Electronically Signed   By: Delbert Phenix M.D.   On: 01/12/2016 08:08   Dg Chest Portable 1 View  Result Date: 01/09/2016 CLINICAL DATA:  30 y/o  M; post intubation. EXAM: PORTABLE CHEST 1 VIEW COMPARISON:  01/09/2016 chest radiograph. FINDINGS: Endotracheal tube tip approximately 4.5 cm from carina. Enteric tube tip below the field of view within the abdomen with proximal side hole at the gastroesophageal junction. Clear lungs. Normal cardiac silhouette. No effusion or pneumothorax. No acute osseous abnormality is evident. IMPRESSION: No acute pulmonary process identified. Endotracheal tube 4.5 cm from carina. Enteric tube tip below the field of view in the abdomen. Electronically Signed   By: Mitzi Hansen M.D.   On: 01/09/2016 23:42   Dg Abd Portable 1v  Result Date: 01/23/2016 CLINICAL DATA:  Feeding tube placement EXAM: PORTABLE ABDOMEN - 1 VIEW COMPARISON:  01/19/2016 FINDINGS: Feeding tube coils in the stomach with the tip in the fundus. Mild diffuse gaseous distention of bowel. Mild gaseous distention of the stomach. IMPRESSION: Feeding tube coils in the stomach with the tip in the fundus. Electronically Signed   By: Charlett Nose M.D.   On: 01/23/2016 14:20   Dg Abd Portable 1v  Result Date: 01/19/2016 CLINICAL DATA:  Orogastric tube placement. EXAM: PORTABLE ABDOMEN - 1 VIEW COMPARISON:  03/15/2015 FINDINGS: Enteric tube is seen with tip in the proximal stomach. No evidence of dilated bowel loops. Retrocardiac left lower lobe  collapse or consolidation noted. IMPRESSION: Enteric tube tip overlies the proximal stomach. Retrocardiac left lower lobe collapse versus consolidation. Electronically Signed   By: Myles Rosenthal M.D.   On: 01/19/2016 15:37   Dg Abd Portable 1v  Result Date: 01/14/2016 CLINICAL DATA:  30 y/o  M; enteric tube placement. EXAM: PORTABLE ABDOMEN - 1 VIEW COMPARISON:  None. FINDINGS: Enteric tube tip projects over the distal stomach.  Normal bowel gas pattern. Bones are unremarkable. IMPRESSION: Enteric tube tip projects over the distal stomach. Electronically Signed   By: Mitzi Hansen M.D.   On: 01/14/2016 00:24     Assessment & Plan:   No problem-specific Assessment & Plan notes found for this encounter.     Rubye Oaks, NP 02/07/2016

## 2016-02-07 NOTE — Addendum Note (Signed)
Addended by: Cydney OkAUGUSTIN, Jerod Mcquain N on: 02/07/2016 05:37 PM   Modules accepted: Orders

## 2016-02-20 ENCOUNTER — Ambulatory Visit (HOSPITAL_COMMUNITY)
Admission: RE | Admit: 2016-02-20 | Discharge: 2016-02-20 | Disposition: A | Discharge status: Home / Self Care | Payer: Self-pay | Source: Ambulatory Visit | Attending: Internal Medicine | Admitting: Internal Medicine

## 2016-02-20 ENCOUNTER — Other Ambulatory Visit: Payer: Self-pay | Admitting: Acute Care

## 2016-02-20 DIAGNOSIS — L03221 Cellulitis of neck: Secondary | ICD-10-CM

## 2016-02-20 DIAGNOSIS — J45909 Unspecified asthma, uncomplicated: Secondary | ICD-10-CM

## 2016-02-20 DIAGNOSIS — T8130XA Disruption of wound, unspecified, initial encounter: Secondary | ICD-10-CM

## 2016-02-20 MED ORDER — FLUTICASONE-SALMETEROL 250-50 MCG/DOSE IN AEPB: 1.0000 | INHALATION_SPRAY | Freq: Two times a day (BID) | RESPIRATORY_TRACT | 5 refills | Status: DC

## 2016-02-20 MED ORDER — CEPHALEXIN 250 MG PO CAPS: 500.0000 mg | ORAL_CAPSULE | Freq: Three times a day (TID) | ORAL | Status: AC

## 2016-02-20 NOTE — Progress Notes (Signed)
   Subjective:    Patient ID: Henry Hardin, male    DOB: 10/08/1985, 31 y.o.   MRN: [id number]  HPI 31 year old male with PMH asthma since childhood. He has never been consistent with taking his controller medications and has been unable to get albuterol due to insurance issues per mother. Recent hospital course:  11/22 intubated for asthma 11/24 air trapping-->started NMB 11/28 paralysis off 12/2 failed extubation 12/5 #8 shiley (DF) 12/10 #6 requested cuffless with plug trial  12/11 deemed to have recovered to rapidly to need CIR 12/12 decannulated.   Was discharged to home after trach removed. Was seen in clinic on 12/21 for hospital f/u. Was referred to the trach clinic as during his f/u visit a suture was identified that could not be removed.    Review of Systems  HENT:       Trach stoma slowly healing  Visible sutures extruding out of trach   Eyes: Negative.   Respiratory: Negative.   Cardiovascular: Negative.   Gastrointestinal: Negative.   Endocrine: Negative.   Genitourinary: Negative.   Musculoskeletal: Negative.   Skin: Negative.   Neurological: Positive for weakness.  Hematological: Negative.   Psychiatric/Behavioral: Negative.       VS- BP 142/73, HR-78, RR-18, SpO2-100% on RA Objective:   Physical Exam  Constitutional: He is oriented to person, place, and time. He appears well-developed and well-nourished. No distress.  HENT:  Head: Normocephalic and atraumatic.  Nose: Nose normal.  Mouth/Throat: Oropharynx is clear and moist. No oropharyngeal exudate.  Eyes: Conjunctivae and EOM are normal. Pupils are equal, round, and reactive to light. No scleral icterus.  Neck: Normal range of motion. No tracheal deviation present. No thyromegaly present.  Trach stoma well granulated and 99.5% closed. Small visible suture extruding from trach site.   Cardiovascular: Normal rate and regular rhythm.  Exam reveals no gallop and no friction rub.   No murmur  heard. Pulmonary/Chest: Effort normal and breath sounds normal. No stridor. No respiratory distress. He has no wheezes. He has no rales.  Abdominal: Soft. Bowel sounds are normal. He exhibits no distension.  Musculoskeletal: Normal range of motion. He exhibits no edema.  Neurological: He is alert and oriented to person, place, and time. Coordination normal.  Skin: He is not diaphoretic.   Procedure Site cleaned w/ sterile chlorahexidine, then localized w/ 1% Lidocaine. Following this the exposed sutures were removed. Pt tolerated well. Pressure held for hemostasis     Assessment & Plan:   Asthma  Extruding suture -->now removed  Discussion Doing well from pulmonary and rehab stand-point. The sutures that were exposed were the sutures used to tie off bleeder vessels during the trach insertion. After localizing I was able to cut off all exposed suture.   Plan Keep current dressing in place Gave her script for Advair Also gave script for Keflex & instructions to start as directed should he develop increased pain, discolored drainage or fever (in addition to calling and making me aware).    Simonne MartinetPeter E Whalen Trompeter ACNP-BC [facility] Pulmonary/Critical Care Pager # [phone] OR # [phone] if no answer

## 2016-02-20 NOTE — Progress Notes (Signed)
Tracheostomy Procedure Note  Henry Hardin [id number] 12/08/1985  Pt was decannulated on 01/29/16. He comes in to clinic today with stoma healed, but a suture still left in.  VS- BP 142/73, HR-78, RR-18, SpO2-100% on RA   Procedure: suture removal  Suture removed by Anders SimmondsPete Babcock NP. Pt tolerated well

## 2016-03-03 ENCOUNTER — Encounter: Payer: Self-pay | Admitting: Internal Medicine

## 2016-03-03 ENCOUNTER — Ambulatory Visit (INDEPENDENT_AMBULATORY_CARE_PROVIDER_SITE_OTHER): Payer: Self-pay | Admitting: Internal Medicine

## 2016-03-03 VITALS — BP 126/84 | HR 85 | Ht 74.0 in | Wt 178.4 lb

## 2016-03-03 DIAGNOSIS — J4540 Moderate persistent asthma, uncomplicated: Secondary | ICD-10-CM

## 2016-03-03 MED ORDER — ALBUTEROL SULFATE (2.5 MG/3ML) 0.083% IN NEBU: 2.5000 mg | INHALATION_SOLUTION | RESPIRATORY_TRACT | 1 refills | Status: DC | PRN

## 2016-03-03 MED ORDER — ALBUTEROL SULFATE HFA 108 (90 BASE) MCG/ACT IN AERS: INHALATION_SPRAY | RESPIRATORY_TRACT | 1 refills | Status: DC

## 2016-03-03 MED ORDER — BUDESONIDE-FORMOTEROL FUMARATE 160-4.5 MCG/ACT IN AERO: 2.0000 | INHALATION_SPRAY | Freq: Two times a day (BID) | RESPIRATORY_TRACT | 1 refills | Status: DC

## 2016-03-03 NOTE — Patient Instructions (Addendum)
Plan A = Automatic = Symbicort 160 Take 2 puffs first thing in am and then another 2 puffs about 12 hours later.  Work on inhaler technique:  relax and gently blow all the way out then take a nice smooth deep breath back in, triggering the inhaler at same time you start breathing in.  Hold for up to 5 seconds if you can. Blow out thru nose. Rinse and gargle with water when done         Plan B = Backup Only use your albuterol (Proair) as a rescue medication to be used if you can't catch your breath by resting or doing a relaxed purse lip breathing pattern.  - The less you use it, the better it will work when you need it. - Ok to use the inhaler up to 2 puffs  every 4 hours if you must but call for appointment if use goes up over your usual need - Don't leave home without it !!  (think of it like the spare tire for your car)   Plan C = Crisis - only use your albuterol nebulizer if you first try Plan B and it fails to help > ok to use the nebulizer up to every 4 hours but if start needing it regularly call for immediate appointment   Please schedule a follow up office visit in 4 weeks, sooner if needed with all medications/ inhalers/solutions in hand to see Tammy NP

## 2016-03-03 NOTE — Progress Notes (Signed)
 @Patient  ID: Henry Hardin, male    DOB: 06/28/1985  .   MRN: [id number]      Referring provider: No ref. 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 01/09/2016 for respiratory failure secondary to status asthmaticus w/ critical illness w/ vent support/-trach   TEST Henry Hardin/Events  Intubated 01/10/16  Trach 12/5>Decanulated 01/27/16  02/07/2016 NP Follow up ; Status Asthmaticus  Patient returns for a post hospital follow-up. Patient was recently admitted for critical illness with severe asthmatic exacerbation. She had acute hypercarbic respiratory failure secondary to status asthmaticus, MSSA bronchitis. Prior to admission. Patient had been seen multiple times in the emergency room for asthma exacerbations. Patient presented in respiratory distress on 01/09/2016 despite aggressive treatments. Patient continued to decompensate. And required intubation. Patient required prolonged ventilatory support . Failed weaning. He required a tracheostomy 12/5  and was successfully weaned ant then decannulated on December 12. Since discharge he is feeling  He is on Advair . Finished prednisone taper today .  Spirometry today shows FEV1 82%, ratio 71, FVC 96%.  Exhaled nitric oxide today is  5 .  Not sure if he has insurance , trying to get Medicaid.  [facility]  He is feeling much better . No wheezing or cough  Strength and energy are returning  Trach stoma is getting smaller and voice is getting stronger.  rec Continue on Advair 1 puff Twice daily  , rinse after use.  Use ProAir 2 puffs every 4hr as needed , this is your rescue inhaler  We are going to set you up for the trach clinic .       03/03/2016  f/u ov/[name] re:  Severe asthma/ not established as to how he's going to get his mes  Chief Complaint  Patient presents with  . Follow-up    Breathing is doing well. He has  not needed albuterol and actually misplaced his inhaler so he requests a new rx.   Not limited by breathing from desired activities    No obvious day to day or daytime variability or assoc excess/ purulent sputum or mucus plugs or hemoptysis or cp or chest tightness, subjective wheeze or overt sinus or hb symptoms. No unusual exp hx or h/o childhood pna/ asthma or knowledge of premature birth.  Sleeping ok without nocturnal  or early am exacerbation  of respiratory  c/o's or need for noct saba. Also denies any obvious fluctuation of symptoms with weather or environmental changes or other aggravating or alleviating factors except as outlined above   Current Medications, Allergies, Complete Past Medical History, Past Surgical History, Family History, and Social History were reviewed in [facility] electronic medical record.  ROS  The following are not active complaints unless bolded sore throat, dysphagia, dental problems, itching, sneezing,  nasal congestion or excess/ purulent secretions, ear ache,   fever, chills, sweats, unintended wt loss, classically pleuritic or exertional cp,  orthopnea pnd or leg swelling, presyncope, palpitations, abdominal pain, anorexia, nausea, vomiting, diarrhea  or change in bowel or bladder habits, change in stools or urine, dysuria,hematuria,  rash, arthralgias, visual complaints, headache, numbness, weakness or ataxia or problems with walking or coordination,  change in mood/affect or memory.                   Physical Exam  amb bm nad / vital signs reviewed  - Note  on arrival 02 sats  98% on  RA     Wt Readings from Last 3 Encounters:  03/03/16 178 lb 6.4 oz (80.9 kg)  02/07/16 171 lb 12.8 oz (77.9 kg)  01/28/16 156 lb (70.8 kg)      GEN: A/Ox3; pleasant , NAD, well nourished    HEENT:  [place]/AT,  EACs-clear, TMs-wnl, NOSE-clear, THROAT-clear, no lesions, no postnasal drip or exudate noted.   NECK:  Supple w/ fair ROM; no JVD; normal carotid  impulses w/o bruits; no thyromegaly or nodules palpated; no lymphadenopathy.   Trach stoma closed with protruding pink wet plug of tissue , no surrounding  Redness   RESP  Clear  P & A; w/o, wheezes/ rales/ or rhonchi. no accessory muscle use, no dullness to percussion  CARD:  RRR, no m/r/g, no peripheral edema, pulses intact, no cyanosis or clubbing.  GI:   Soft & nt; nml bowel sounds; no organomegaly or masses detected.   Musco: Warm bil, no deformities or joint swelling noted.   Neuro: alert, no focal deficits noted.    Skin: Warm, no lesions or rashes  Psych:  No change in mood or affect. No depression or anxiety.  No memory loss.     I personally reviewed images and agree with radiology impression as follows:  CXR:   01/23/16 No acute abnormality noted.   Assessment & Plan:

## 2016-03-04 ENCOUNTER — Encounter: Payer: Self-pay | Admitting: Internal Medicine

## 2016-03-04 NOTE — Assessment & Plan Note (Addendum)
Spirometry 03/03/2016  FEV1 3.55 (82%)  Ratio 71 min obst contour   - 03/03/2016  After extensive coaching HFA effectiveness =    75% > try symbicort 160 2bid   He is well controlled presently with the main challenge being compliance related to lack of funds but says this should be worked out w/in a month so given 4 weeks samples of symb and f/u in 4 weeks   I had an extended discussion with the patient reviewing all relevant studies (including extensive inpt records in this pt not previously known to me )completed to date and  lasting 15 to 20 minutes of a 25 minute visit    Each maintenance medication was reviewed in detail including most importantly the difference between maintenance and prns and under what circumstances the prns are to be triggered using an action plan format that is not reflected in the computer generated alphabetically organized AVS.    Please see AVS for specific instructions unique to this visit that I personally wrote and verbalized to the the pt in detail and then reviewed with pt  by my nurse highlighting any  changes in therapy recommended at today's visit to their plan of care.

## 2016-03-25 ENCOUNTER — Institutional Professional Consult (permissible substitution): Payer: Self-pay | Admitting: Pulmonary Disease

## 2016-03-31 ENCOUNTER — Ambulatory Visit (INDEPENDENT_AMBULATORY_CARE_PROVIDER_SITE_OTHER): Payer: Self-pay | Admitting: Adult Health

## 2016-03-31 ENCOUNTER — Encounter: Payer: Self-pay | Admitting: Adult Health

## 2016-03-31 DIAGNOSIS — J209 Acute bronchitis, unspecified: Secondary | ICD-10-CM

## 2016-03-31 DIAGNOSIS — J4540 Moderate persistent asthma, uncomplicated: Secondary | ICD-10-CM

## 2016-03-31 MED ORDER — FLUTICASONE-SALMETEROL 250-50 MCG/DOSE IN AEPB: 1.0000 | INHALATION_SPRAY | Freq: Two times a day (BID) | RESPIRATORY_TRACT | 0 refills | Status: DC

## 2016-03-31 MED ORDER — BUDESONIDE-FORMOTEROL FUMARATE 160-4.5 MCG/ACT IN AERO: 2.0000 | INHALATION_SPRAY | Freq: Two times a day (BID) | RESPIRATORY_TRACT | 0 refills | Status: DC

## 2016-03-31 MED ORDER — ALBUTEROL SULFATE HFA 108 (90 BASE) MCG/ACT IN AERS: INHALATION_SPRAY | RESPIRATORY_TRACT | 5 refills | Status: DC

## 2016-03-31 MED ORDER — CEFDINIR 300 MG PO CAPS: 300.0000 mg | ORAL_CAPSULE | Freq: Two times a day (BID) | ORAL | 0 refills | Status: DC

## 2016-03-31 MED ORDER — BUDESONIDE-FORMOTEROL FUMARATE 160-4.5 MCG/ACT IN AERO: 2.0000 | INHALATION_SPRAY | Freq: Two times a day (BID) | RESPIRATORY_TRACT | 5 refills | Status: DC

## 2016-03-31 NOTE — Progress Notes (Signed)
 @Patient  ID: Henry Hardin, male    DOB: 03/24/1985, 31 y.o.   MRN: [id number]  Chief Complaint  Patient presents with  . Follow-up    Asthma     Referring provider: No ref. provider found  HPI: 31 year old male smoker with past medical history of asthma seen for pulmonary consult during hospitalization. 01/09/2016 for respiratory failure secondary to status asthmaticus w/ critical illness w/ vent support/-trach   TEST /Events  Intubated 01/10/16  Trach 12/5>Decanulated 12/10 01/2016 Spirometry today shows FEV1 82%, ratio 71, FVC 96%.  Exhaled nitric oxide today is  5 .   03/31/2016 Follow up : Asthma  Patient presents for a one-month follow-up.. Patient says that over the last week. He's been having increased sinus and chest congestion with thick mucus. He's been coughing and sneezing more. He denies any significant wheezing. However, says that his inhaler. Has not been covered at the pharmacy. We did call the pharmacy and they said they have not gotten his new insurance card. Patient did have a severe illness in November 2017 with respiratory failure secondary to status asthmaticus with critical illness and vent support, requiring trach. He was D cannulated. Patient says his trach stoma is slowly closing, but has not completely closed and has some , and drainage. No significant redness, bleeding or red streaking. Denies any fever    Allergies  Allergen Reactions  . Bee Venom Anaphylaxis, Itching and Swelling  . Magnesium-Containing Compounds Rash    There is no immunization history for the selected administration types on file for this patient.  Past Medical History:  Diagnosis Date  . Asthma   . Pneumonia     Tobacco History: History  Smoking Status  . Former Smoker  . Packs/day: 0.75  . Years: 13.00  . Types: Cigarettes  . Quit date: 01/09/2016  Smokeless Tobacco  . Never Used   Counseling given: Not Answered   Outpatient Encounter Prescriptions as of  03/31/2016  Medication Sig  . acetaminophen (TYLENOL) 500 MG tablet Take 500-1,000 mg by mouth every 6 (six) hours as needed (for pain).  Marland Kitchen. albuterol (PROAIR HFA) 108 (90 Base) MCG/ACT inhaler 2 puffs every 4 hours as needed only  if your can't catch your breath  . albuterol (PROVENTIL) (2.5 MG/3ML) 0.083% nebulizer solution Take 3 mLs (2.5 mg total) by nebulization every 4 (four) hours as needed for wheezing or shortness of breath.  . budesonide-formoterol (SYMBICORT) 160-4.5 MCG/ACT inhaler Inhale 2 puffs into the lungs 2 (two) times daily.  . Fluticasone-Salmeterol (ADVAIR) 250-50 MCG/DOSE AEPB Inhale 1 puff into the lungs 2 (two) times daily.  Marland Kitchen. ibuprofen (ADVIL,MOTRIN) 200 MG tablet Take 200-800 mg by mouth every 6 (six) hours as needed (for pain).  . cefdinir (OMNICEF) 300 MG capsule Take 1 capsule (300 mg total) by mouth 2 (two) times daily.   No facility-administered encounter medications on file as of 03/31/2016.      Review of Systems  Constitutional:   No  weight loss, night sweats,  Fevers, chills, fatigue, or  lassitude.  HEENT:   No headaches,  Difficulty swallowing,  Tooth/dental problems, or  Sore throat,                No sneezing, itching, ear ache,  +nasal congestion, post nasal drip,   CV:  No chest pain,  Orthopnea, PND, swelling in lower extremities, anasarca, dizziness, palpitations, syncope.   GI  No heartburn, indigestion, abdominal pain, nausea, vomiting, diarrhea, change in bowel habits, loss of  appetite, bloody stools.   Resp:No wheezing.  No chest wall deformity  Skin: no rash or lesions.  GU: no dysuria, change in color of urine, no urgency or frequency.  No flank pain, no hematuria   MS:  No joint pain or swelling.  No decreased range of motion.  No back pain.    Physical Exam  BP 122/70 (BP Location: Right Arm, Cuff Size: Normal)   Pulse 68   Ht 6\' 2"  (1.88 m)   Wt 178 lb 3.2 oz (80.8 kg)   SpO2 98%   BMI 22.88 kg/m   GEN: A/Ox3; pleasant ,  NAD, well nourished    HEENT:  [place]/AT,  EACs-clear, TMs-wnl, NOSE-clear, THROAT-clear, no lesions, no postnasal drip or exudate noted.   NECK:  Supple w/ fair ROM; no JVD; normal carotid impulses w/o bruits; no thyromegaly or nodules palpated; no lymphadenopathy.  Trach stoma w/ protruding pick tissue , no redness or drainage noted.  No Stridor   RESP  Clear  P & A; w/o, wheezes/ rales/ or rhonchi. no accessory muscle use, no dullness to percussion . Speaking in full sentences ,   CARD:  RRR, no m/r/g, no peripheral edema, pulses intact, no cyanosis or clubbing.  GI:   Soft & nt; nml bowel sounds; no organomegaly or masses detected.   Musco: Warm bil, no deformities or joint swelling noted.   Neuro: alert, no focal deficits noted.    Skin: Warm, no lesions or rashes    Lab Results:Imaging: No results found.   Assessment & Plan:   Asthma Controlled on rx -no flare with bronchitis  Will continue on Symbicort Pharmacy called and rx sent , pt is take insurance card by therre   Plan  Patient Instructions  Omnicef 300mg  Twice daily  For 1 week , take with food.  Continue on Symbicort 2 puffs Twice daily , rinse after use.  Use ProAir 2 puffs every 4hr as needed , this is your rescue inhaler  Follow up with Dr. Sherene Sires  In 3 months and As needed   Please contact office for sooner follow up if symptoms do not improve or worsen or seek emergency care       Acute bronchitis Flare   Plan  Patient Instructions  Omnicef 300mg  Twice daily  For 1 week , take with food.  Continue on Symbicort 2 puffs Twice daily , rinse after use.  Use ProAir 2 puffs every 4hr as needed , this is your rescue inhaler  Follow up with Dr. Sherene Sires  In 3 months and As needed   Please contact office for sooner follow up if symptoms do not improve or worsen or seek emergency care          Rubye Oaks, NP 03/31/2016

## 2016-03-31 NOTE — Patient Instructions (Addendum)
Omnicef 300mg  Twice daily  For 1 week , take with food.  Continue on Symbicort 2 puffs Twice daily , rinse after use.  Use ProAir 2 puffs every 4hr as needed , this is your rescue inhaler  Follow up with Dr. Sherene SiresWert  In 3 months and As needed   Please contact office for sooner follow up if symptoms do not improve or worsen or seek emergency care

## 2016-03-31 NOTE — Assessment & Plan Note (Signed)
Flare   Plan  Patient Instructions  Omnicef 300mg  Twice daily  For 1 week , take with food.  Continue on Symbicort 2 puffs Twice daily , rinse after use.  Use ProAir 2 puffs every 4hr as needed , this is your rescue inhaler  Follow up with Dr. Sherene SiresWert  In 3 months and As needed   Please contact office for sooner follow up if symptoms do not improve or worsen or seek emergency care

## 2016-03-31 NOTE — Addendum Note (Signed)
Addended by: Boone MasterJONES, JESSICA E on: 03/31/2016 05:28 PM   Modules accepted: Orders

## 2016-03-31 NOTE — Progress Notes (Signed)
Chart and office note reviewed in detail  > agree with a/p as outlined    

## 2016-03-31 NOTE — Assessment & Plan Note (Signed)
Controlled on rx -no flare with bronchitis  Will continue on Symbicort Pharmacy called and rx sent , pt is take insurance card by therre   Plan  Patient Instructions  Omnicef 300mg  Twice daily  For 1 week , take with food.  Continue on Symbicort 2 puffs Twice daily , rinse after use.  Use ProAir 2 puffs every 4hr as needed , this is your rescue inhaler  Follow up with Dr. Sherene SiresWert  In 3 months and As needed   Please contact office for sooner follow up if symptoms do not improve or worsen or seek emergency care

## 2016-06-17 ENCOUNTER — Emergency Department (HOSPITAL_COMMUNITY)
Admission: EM | Admit: 2016-06-17 | Discharge: 2016-06-17 | Disposition: A | Discharge status: Home / Self Care | Payer: Medicaid Other | Attending: Emergency Medicine | Admitting: Emergency Medicine

## 2016-06-17 DIAGNOSIS — Z79899 Other long term (current) drug therapy: Secondary | ICD-10-CM | POA: Insufficient documentation

## 2016-06-17 DIAGNOSIS — Z87891 Personal history of nicotine dependence: Secondary | ICD-10-CM | POA: Insufficient documentation

## 2016-06-17 DIAGNOSIS — J45909 Unspecified asthma, uncomplicated: Secondary | ICD-10-CM | POA: Insufficient documentation

## 2016-06-17 DIAGNOSIS — K047 Periapical abscess without sinus: Secondary | ICD-10-CM | POA: Insufficient documentation

## 2016-06-17 MED ORDER — CLINDAMYCIN HCL 150 MG PO CAPS
300.0000 mg | ORAL_CAPSULE | Freq: Once | ORAL | Status: AC
  Administered 2016-06-17: 300 mg via ORAL
  Filled 2016-06-17: qty 2

## 2016-06-17 MED ORDER — HYDROCODONE-ACETAMINOPHEN 5-325 MG PO TABS: 2.0000 | ORAL_TABLET | ORAL | 0 refills | Status: DC | PRN

## 2016-06-17 MED ORDER — CLINDAMYCIN HCL 300 MG PO CAPS: 300.0000 mg | ORAL_CAPSULE | Freq: Four times a day (QID) | ORAL | 0 refills | Status: DC

## 2016-06-17 MED ORDER — HYDROCODONE-ACETAMINOPHEN 5-325 MG PO TABS
2.0000 | ORAL_TABLET | Freq: Once | ORAL | Status: AC
  Administered 2016-06-17: 2 via ORAL
  Filled 2016-06-17: qty 2

## 2016-06-17 NOTE — ED Notes (Signed)
Pt alert & oriented x4, stable gait. Patient given discharge instructions, paperwork & prescription(s). Registration completed in room. Patient informed not to drive, operate any equipment & handel any important documents 4 hours after taking pain medication.  Patient verbalized understanding. Pt left department w/ no further questions.

## 2016-06-17 NOTE — ED Notes (Signed)
Pt has abcess to the right upper gum.

## 2016-06-17 NOTE — ED Provider Notes (Signed)
[facility]Y DEPT Provider Note   CSN: [id number] Arrival date & time: 06/17/16  2031     History   Chief Complaint Chief Complaint  Patient presents with  . Abscess  . Dental Pain    HPI Henry Hardin is a 31 y.o. male.  The history is provided by the patient. No language interpreter was used.  Dental Pain   This is a new problem. The current episode started more than 2 days ago. The problem occurs constantly. The problem has been gradually worsening. The pain is moderate. He has tried nothing for the symptoms. The treatment provided moderate relief.  Dental Injury   Pt complains of swelling to the right side of his mouth   Past Medical History:  Diagnosis Date  . Asthma   . Pneumonia     Patient Active Problem List   Diagnosis Date Noted  . Acute bronchitis 03/31/2016  . Extruding suture, initial encounter   . 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 Acute respiratory acidosis   . Respiratory distress   . Status asthmaticus 01/10/2016  . Acute respiratory failure with hypercapnia (HCC)   . Asthma   . ALLERGIC RHINITIS 08/02/2009  . RESPIRATORY FAILURE, ACUTE 08/02/2009  . GERD 08/02/2009  . HYPERGLYCEMIA 08/02/2009    No past surgical history on file.     Home Medications    Prior to Admission medications   Medication Sig Start Date End Date Taking? Authorizing Provider  acetaminophen (TYLENOL) 500 MG tablet Take 500-1,000 mg by mouth every 6 (six) hours as needed (for pain).    Historical Provider, MD  albuterol (PROAIR HFA) 108 (90 Base) MCG/ACT inhaler 2 puffs every 4 hours as needed only  if your can't catch your breath 03/31/16   Tammy S Parrett, NP  albuterol (PROVENTIL) (2.5 MG/3ML) 0.083% nebulizer solution Take 3 mLs (2.5 mg total) by nebulization every 4 (four) hours as needed for wheezing or shortness of breath. 03/03/16   Nyoka Cowden, MD  budesonide-formoterol [facility]) 160-4.5 MCG/ACT inhaler Inhale 2 puffs into the lungs 2 (two) times  daily. 03/31/16   Tammy S Parrett, NP  budesonide-formoterol (SYMBICORT) 160-4.5 MCG/ACT inhaler Inhale 2 puffs into the lungs 2 (two) times daily. 03/31/16 04/01/16  Tammy S Parrett, NP  cefdinir (OMNICEF) 300 MG capsule Take 1 capsule (300 mg total) by mouth 2 (two) times daily. 03/31/16   Tammy S Parrett, NP  clindamycin (CLEOCIN) 300 MG capsule Take 1 capsule (300 mg total) by mouth every 6 (six) hours. 06/17/16   Elson Areas, PA-C  Fluticasone-Salmeterol (ADVAIR DISKUS) 250-50 MCG/DOSE AEPB Inhale 1 puff into the lungs 2 (two) times daily. 03/31/16 04/01/16  Tammy S Parrett, NP  HYDROcodone-acetaminophen (NORCO/VICODIN) 5-325 MG tablet Take 2 tablets by mouth every 4 (four) hours as needed. 06/17/16   Elson Areas, PA-C  ibuprofen (ADVIL,MOTRIN) 200 MG tablet Take 200-800 mg by mouth every 6 (six) hours as needed (for pain).    Historical Provider, MD    Family History Family History  Problem Relation Age of Onset  . Healthy Mother     Social History Social History  Substance Use Topics  . Smoking status: Former Smoker    Packs/day: 0.75    Years: 13.00    Types: Cigarettes    Quit date: 01/09/2016  . Smokeless tobacco: Never Used  . Alcohol use Yes     Comment: Once every two months     Allergies   Bee venom and Magnesium-containing compounds  Review of Systems Review of Systems  All other systems reviewed and are negative.    Physical Exam Updated Vital Signs BP 131/75   Pulse 62   Temp 98.9 F (37.2 C) (Oral)   Resp 20   Ht  (1.88 m)   Wt 86.2 kg   SpO2 100%   BMI 24.39 kg/m   Physical Exam  Constitutional: He appears well-developed and well-nourished.  HENT:  Head: Normocephalic and atraumatic.  Swollen right upper gumline, swelling right face. No lower facial swelling  Eyes: Conjunctivae are normal.  Neck: Neck supple.  Cardiovascular: Normal rate.   No murmur heard. Pulmonary/Chest: Effort normal. No respiratory distress.  Abdominal: There is  no tenderness.  Musculoskeletal: He exhibits no edema.  Neurological: He is alert.  Skin: Skin is warm and dry.  Psychiatric: He has a normal mood and affect.  Nursing note and vitals reviewed.    ED Treatments / Results  Labs (all labs ordered are listed, but only abnormal results are displayed) Labs Reviewed - No data to display  EKG  EKG Interpretation None       Radiology No results found.  Procedures Procedures (including critical care time)  Medications Ordered in ED Medications  clindamycin (CLEOCIN) capsule 300 mg (300 mg Oral Given 06/17/16 2157)  HYDROcodone-acetaminophen (NORCO/VICODIN) 5-325 MG per tablet 2 tablet (2 tablets Oral Given 06/17/16 2157)     Initial Impression / Assessment and Plan / ED Course  I have reviewed the triage vital signs and the nursing notes.  Pertinent labs & imaging results that were available during my care of the patient were reviewed by me and considered in my medical decision making (see chart for details).       Final Clinical Impressions(s) / ED Diagnoses   Final diagnoses:  Dental abscess    New Prescriptions Discharge Medication List as of 06/17/2016  9:53 PM    START taking these medications   Details  clindamycin (CLEOCIN) 300 MG capsule Take 1 capsule (300 mg total) by mouth every 6 (six) hours., Starting Tue 06/17/2016, Print    HYDROcodone-acetaminophen (NORCO/VICODIN) 5-325 MG tablet Take 2 tablets by mouth every 4 (four) hours as needed., Starting Tue 06/17/2016, Print      An After Visit Summary was printed and given to the patient.    Lonia Skinner Jenkins, PA-C 06/17/16 2254    Bethann Berkshire, MD 06/18/16 [phone]

## 2016-06-17 NOTE — ED Triage Notes (Signed)
Swelling right side of face for a week

## 2016-06-30 ENCOUNTER — Ambulatory Visit: Payer: Medicaid Other | Admitting: Internal Medicine

## 2016-07-26 ENCOUNTER — Telehealth: Payer: Self-pay | Admitting: Emergency Medicine

## 2016-07-26 NOTE — Telephone Encounter (Signed)
Called by patient - states that he has run out of both symbicort, has also been on adviar before . Wants refill called to [facility] on [address]. My electronic prescribing has not been working, so called directly to pharmacy, 3 refills

## 2016-07-31 ENCOUNTER — Telehealth: Payer: Self-pay | Admitting: Internal Medicine

## 2016-07-31 MED ORDER — BUDESONIDE-FORMOTEROL FUMARATE 160-4.5 MCG/ACT IN AERO: 2.0000 | INHALATION_SPRAY | Freq: Two times a day (BID) | RESPIRATORY_TRACT | 0 refills | Status: DC

## 2016-07-31 NOTE — Telephone Encounter (Signed)
Spoke with pt. He needs samples of Symbicort. Samples have been given to him. He will be rescheduling his OV with MW. Nothing further was needed.

## 2016-08-19 ENCOUNTER — Ambulatory Visit: Payer: Self-pay

## 2016-08-19 ENCOUNTER — Other Ambulatory Visit: Payer: Self-pay | Admitting: Occupational Medicine

## 2016-08-19 DIAGNOSIS — M542 Cervicalgia: Secondary | ICD-10-CM

## 2016-08-22 ENCOUNTER — Emergency Department (HOSPITAL_COMMUNITY)
Admission: EM | Admit: 2016-08-22 | Discharge: 2016-08-22 | Disposition: A | Discharge status: Home / Self Care | Payer: Medicaid Other | Attending: Emergency Medicine | Admitting: Emergency Medicine

## 2016-08-22 DIAGNOSIS — Z5329 Procedure and treatment not carried out because of patient's decision for other reasons: Secondary | ICD-10-CM | POA: Insufficient documentation

## 2016-08-22 NOTE — ED Notes (Signed)
Pt Called for Triage. No answer.

## 2016-08-22 NOTE — ED Notes (Signed)
Pt reported he was going to go move his car. Pt has not returned at this time.

## 2016-08-22 NOTE — ED Notes (Signed)
Pt never returned

## 2016-08-22 NOTE — ED Notes (Signed)
Called for Triage x 2. No answer

## 2016-08-22 NOTE — ED Notes (Signed)
Pt called x 3 No answer. Pt has not returned into the ED. Pt was seen getting into a car in the passenger seat 1 minute after he checked in to the ED.

## 2016-08-25 ENCOUNTER — Ambulatory Visit: Payer: Medicaid Other | Admitting: Internal Medicine

## 2016-11-12 ENCOUNTER — Emergency Department (HOSPITAL_COMMUNITY)
Admission: EM | Admit: 2016-11-12 | Discharge: 2016-11-12 | Disposition: A | Discharge status: Home / Self Care | Payer: Self-pay | Attending: Emergency Medicine | Admitting: Emergency Medicine

## 2016-11-12 ENCOUNTER — Encounter (HOSPITAL_COMMUNITY): Payer: Self-pay | Admitting: Emergency Medicine

## 2016-11-12 DIAGNOSIS — J4541 Moderate persistent asthma with (acute) exacerbation: Secondary | ICD-10-CM | POA: Insufficient documentation

## 2016-11-12 DIAGNOSIS — Z79899 Other long term (current) drug therapy: Secondary | ICD-10-CM | POA: Insufficient documentation

## 2016-11-12 DIAGNOSIS — Z87891 Personal history of nicotine dependence: Secondary | ICD-10-CM | POA: Insufficient documentation

## 2016-11-12 MED ORDER — ALBUTEROL SULFATE HFA 108 (90 BASE) MCG/ACT IN AERS: 1.0000 | INHALATION_SPRAY | Freq: Four times a day (QID) | RESPIRATORY_TRACT | 0 refills | Status: DC | PRN

## 2016-11-12 MED ORDER — DEXAMETHASONE 4 MG PO TABS
8.0000 mg | ORAL_TABLET | Freq: Once | ORAL | Status: AC
  Administered 2016-11-12: 8 mg via ORAL
  Filled 2016-11-12: qty 2

## 2016-11-12 MED ORDER — AMOXICILLIN 500 MG PO CAPS: 500.0000 mg | ORAL_CAPSULE | Freq: Three times a day (TID) | ORAL | 0 refills | Status: DC

## 2016-11-12 MED ORDER — ALBUTEROL SULFATE HFA 108 (90 BASE) MCG/ACT IN AERS
2.0000 | INHALATION_SPRAY | Freq: Once | RESPIRATORY_TRACT | Status: AC
  Administered 2016-11-12: 2 via RESPIRATORY_TRACT
  Filled 2016-11-12: qty 6.7

## 2016-11-12 NOTE — ED Triage Notes (Signed)
Pt reports he is going out of town and is out of his inhaler, his doctor instructed him to come to ED and we would provide him with inhaler. Pt reports he was dx with pneumonia on Friday, no SOB noted.

## 2016-11-12 NOTE — ED Provider Notes (Signed)
[facility]-EMERGENCY DEPT Provider Note   CSN: [id number] Arrival date & time: 11/12/16  0121     History   Chief Complaint Chief Complaint  Patient presents with  . Asthma    HPI Henry Hardin is a 31 y.o. male.  The history is provided by the patient.  Asthma  This is a chronic problem. The current episode started more than 2 days ago. The problem occurs daily. The problem has not changed since onset.Associated symptoms include shortness of breath. Pertinent negatives include no chest pain. Nothing aggravates the symptoms. Nothing relieves the symptoms.   Pt reports he is here for an inhaler He reports recent diagnosis of pneumonia but unable to fill antibiotics That was diagnosed over a week ago He reports continued cough and sore throat, thinks he has strep throat He continues to smoke cigarettes No fever/vomiting No CP  Past Medical History:  Diagnosis Date  . Asthma   . Pneumonia     Patient Active Problem List   Diagnosis Date Noted  . Acute bronchitis 03/31/2016  . Extruding suture, initial encounter   . 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 Acute respiratory acidosis   . Respiratory distress   . Status asthmaticus 01/10/2016  . Acute respiratory failure with hypercapnia (HCC)   . Asthma   . ALLERGIC RHINITIS 08/02/2009  . RESPIRATORY FAILURE, ACUTE 08/02/2009  . GERD 08/02/2009  . HYPERGLYCEMIA 08/02/2009    History reviewed. No pertinent surgical history.     Home Medications    Prior to Admission medications   Medication Sig Start Date End Date Taking? Authorizing Provider  acetaminophen (TYLENOL) 500 MG tablet Take 500-1,000 mg by mouth every 6 (six) hours as needed (for pain).    [provider]  albuterol (PROVENTIL HFA;VENTOLIN HFA) 108 (90 Base) MCG/ACT inhaler Inhale 1-2 puffs into the lungs every 6 (six) hours as needed for wheezing or shortness of breath. 11/12/16   Zadie Rhine, MD  amoxicillin (AMOXIL) 500 MG capsule Take 1  capsule (500 mg total) by mouth 3 (three) times daily. 11/12/16   Zadie Rhine, MD  budesonide-formoterol [facility]) 160-4.5 MCG/ACT inhaler Inhale 2 puffs into the lungs 2 (two) times daily. 03/31/16   Parrett, Virgel Bouquet, NP  budesonide-formoterol (SYMBICORT) 160-4.5 MCG/ACT inhaler Inhale 2 puffs into the lungs 2 (two) times daily. 07/31/16 08/01/16  Nyoka Cowden, MD  Fluticasone-Salmeterol (ADVAIR DISKUS) 250-50 MCG/DOSE AEPB Inhale 1 puff into the lungs 2 (two) times daily. 03/31/16 04/01/16  Parrett, Virgel Bouquet, NP    Family History Family History  Problem Relation Age of Onset  . Healthy Mother     Social History Social History  Substance Use Topics  . Smoking status: Former Smoker    Packs/day: 0.75    Years: 13.00    Types: Cigarettes    Quit date: 01/09/2016  . Smokeless tobacco: Never Used  . Alcohol use Yes     Comment: Once every two months     Allergies   Bee venom and Magnesium-containing compounds   Review of Systems Review of Systems  Constitutional: Negative for fever.  HENT: Positive for sore throat. Negative for trouble swallowing.   Respiratory: Positive for shortness of breath.   Cardiovascular: Negative for chest pain.     Physical Exam Updated Vital Signs BP 135/72 (BP Location: Right Arm)   Pulse 73   Temp 98.1 F (36.7 C) (Oral)   Resp 18   Ht 1.88 m ( )   Wt 88.5 kg (195 lb)  SpO2 98%   BMI 25.04 kg/m   Physical Exam CONSTITUTIONAL: Well developed/well nourished, sleeping, no distress HEAD: Normocephalic/atraumatic EYES: EOMI/PERRL ENMT: Mucous membranes moist, uvula midline, no stridor NECK: supple no meningeal signs, previous trach scar noted SPINE/BACK:entire spine nontender CV: S1/S2 noted, no murmurs/rubs/gallops noted LUNGS: scattered wheeze, no distress ABDOMEN: soft, nontender NEURO: Pt is awake/alert/appropriate, moves all extremitiesx4.  No facial droop.   SKIN: warm, color normal PSYCH: no abnormalities of mood  noted, alert and oriented to situation   ED Treatments / Results  Labs (all labs ordered are listed, but only abnormal results are displayed) Labs Reviewed - No data to display  EKG  EKG Interpretation None       Radiology No results found.  Procedures Procedures (including critical care time)  Medications Ordered in ED Medications  albuterol (PROVENTIL HFA;VENTOLIN HFA) 108 (90 Base) MCG/ACT inhaler 2 puff (2 puffs Inhalation Given 11/12/16 0341)  dexamethasone (DECADRON) tablet 8 mg (8 mg Oral Given 11/12/16 0341)     Initial Impression / Assessment and Plan / ED Course  I have reviewed the triage vital signs and the nursing notes.  I reviewed CXR from 9/20 that was done at [facility] (viewed via EPIC) No pneumonia However due to persistent sore throat will place him on amoxicillin which he states he can afford Decadron ordered Albuterol given Advised to quit smoking No signs of distress requiring admission   Final Clinical Impressions(s) / ED Diagnoses   Final diagnoses:  Moderate persistent asthma with exacerbation    New Prescriptions Discharge Medication List as of 11/12/2016  3:39 AM    START taking these medications   Details  amoxicillin (AMOXIL) 500 MG capsule Take 1 capsule (500 mg total) by mouth 3 (three) times daily., Starting Wed 11/12/2016, Print         Zadie Rhine, MD 11/12/16 [phone]

## 2016-12-22 IMAGING — CR DG CHEST 2V
3 series · 3 of 3 positions shown · non-contrast
Comparison: Chest radiograph from 12/18/2015

CLINICAL DATA: Acute onset of shortness of breath and wheezing.
Initial encounter.

EXAM:
CHEST  2 VIEW

[w chest lat]
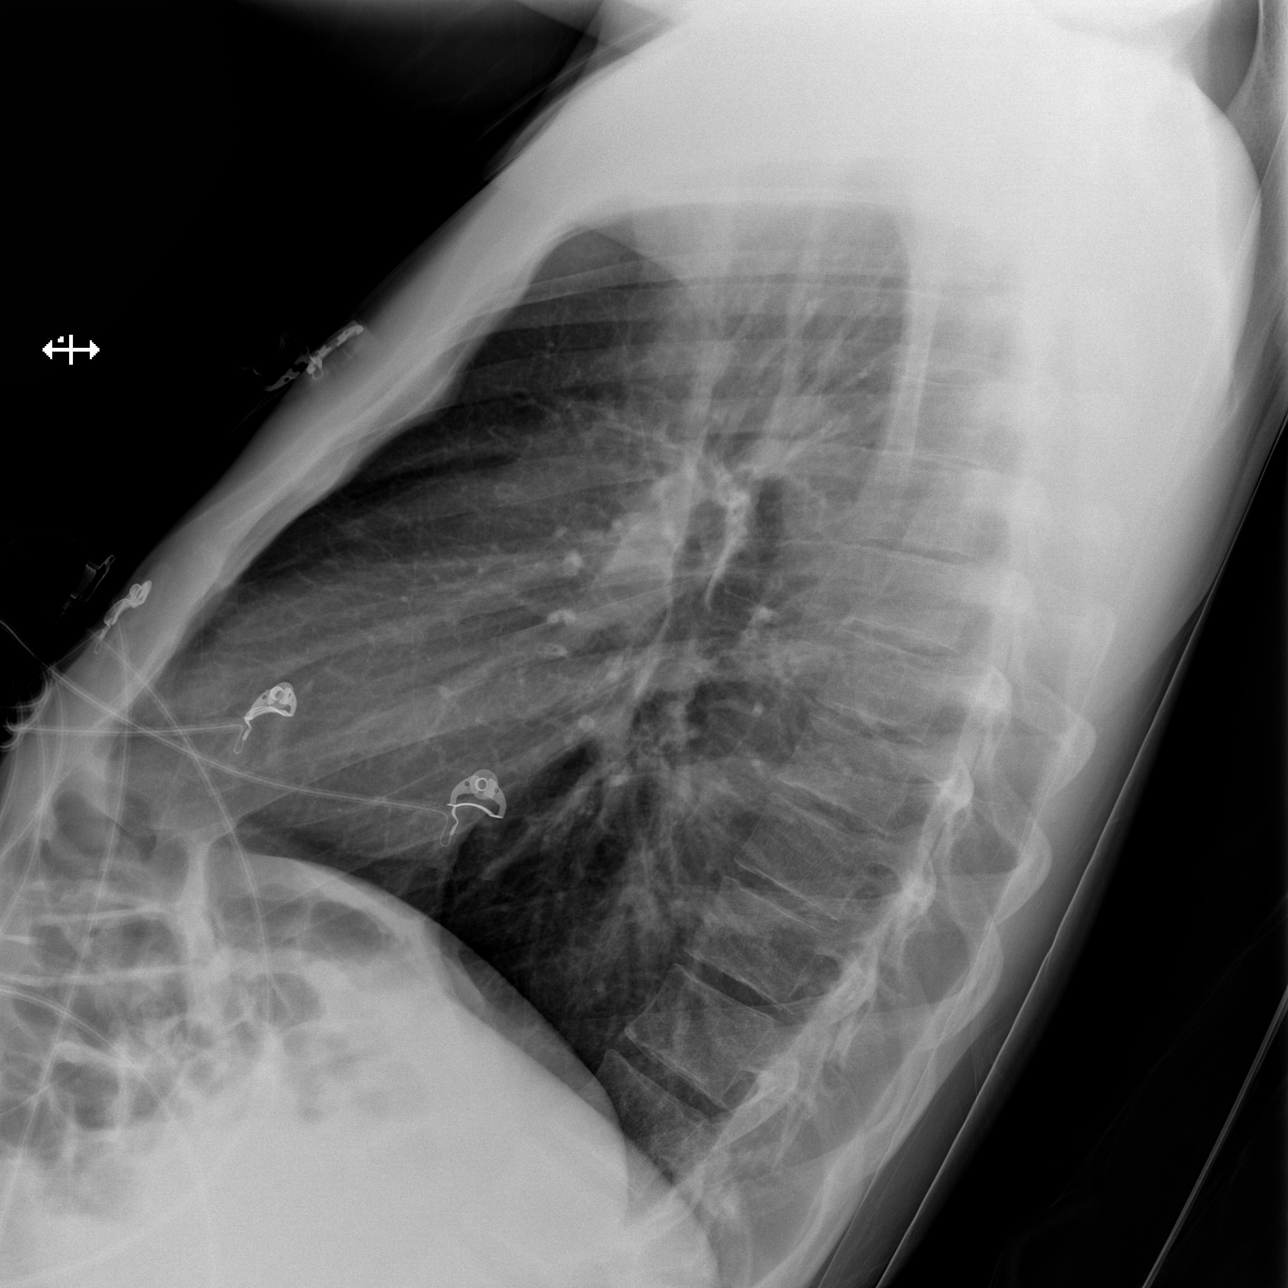

[x chest ap (1 of 2)]
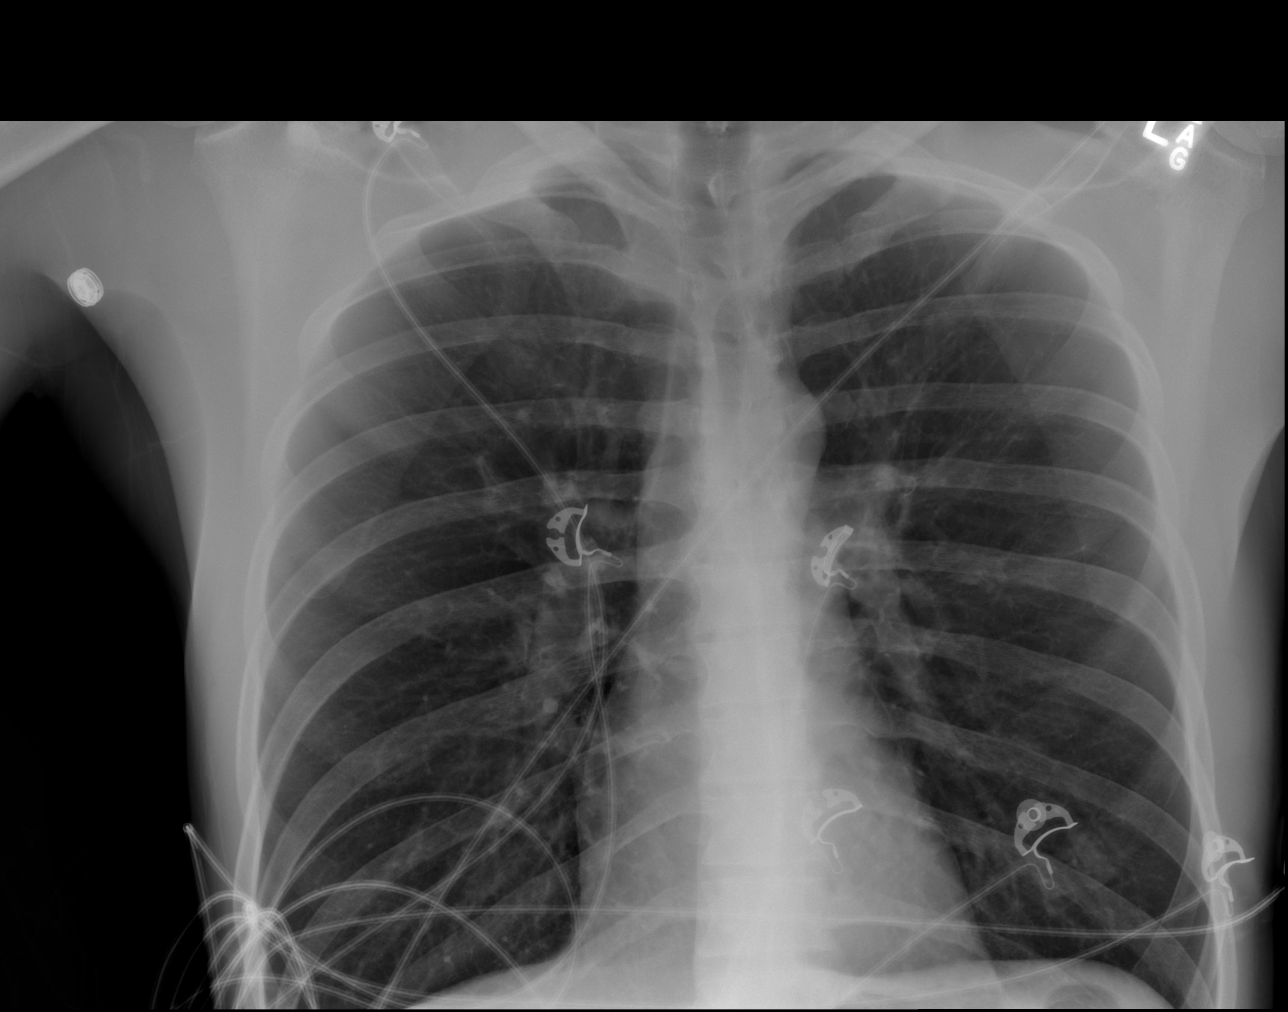

[x chest ap (2 of 2)]
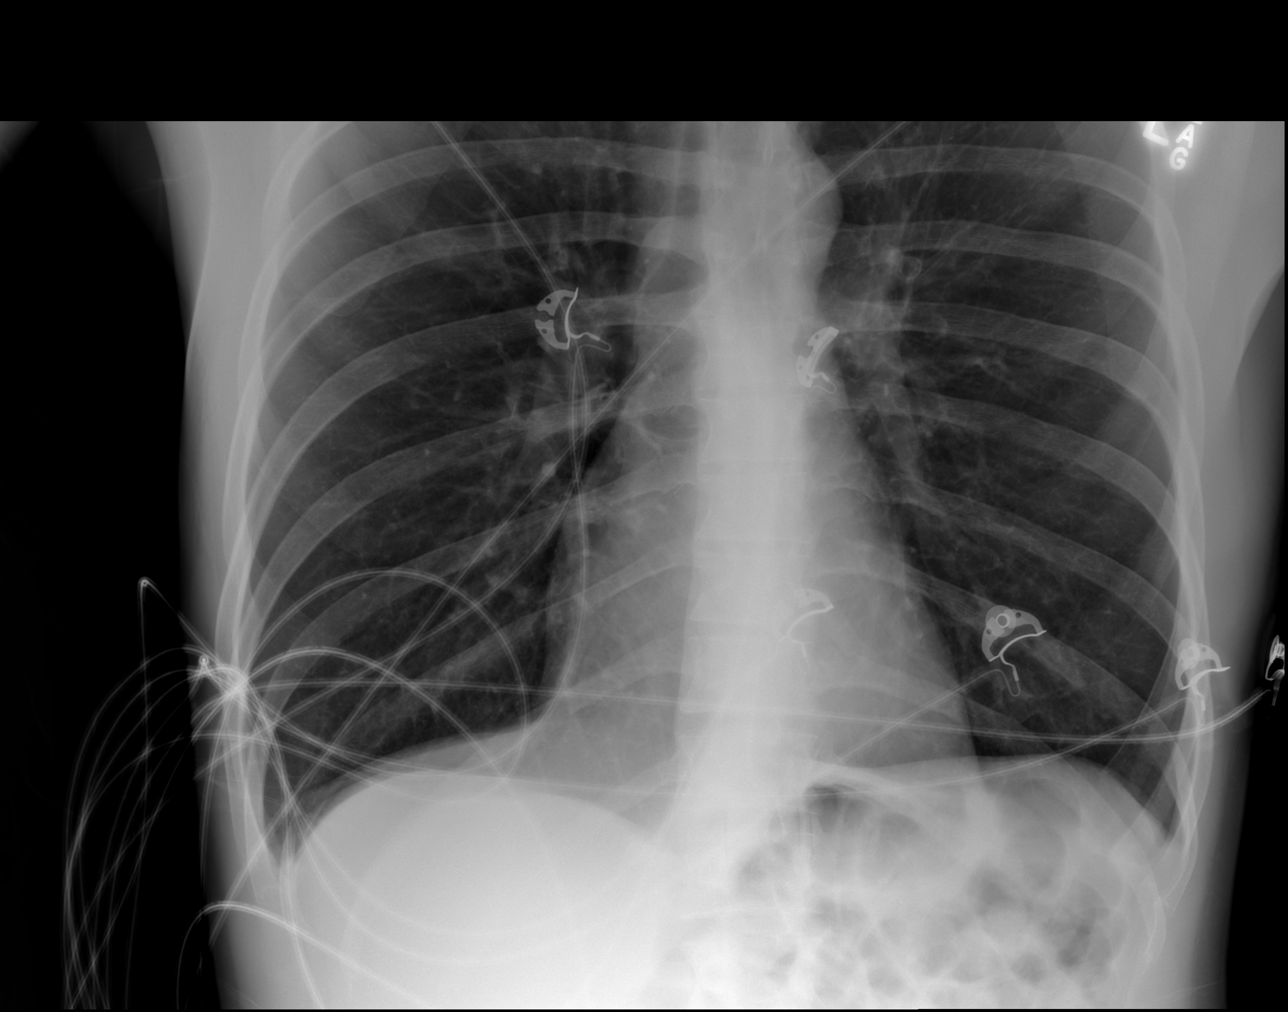

[3 of 3 positions shown; findings below may reference images not displayed]

FINDINGS: The lungs are well-aerated and clear. There is no evidence of focal
opacification, pleural effusion or pneumothorax.

The heart is normal in size; the mediastinal contour is within
normal limits. No acute osseous abnormalities are seen.
IMPRESSION: No acute cardiopulmonary process seen.

## 2016-12-22 IMAGING — DX DG CHEST 1V PORT
1 series · 1 of 1 positions shown · non-contrast
Comparison: 01/09/2016 chest radiograph.

CLINICAL DATA: 30 y/o  M; post intubation.

EXAM:
PORTABLE CHEST 1 VIEW

[chest ap]
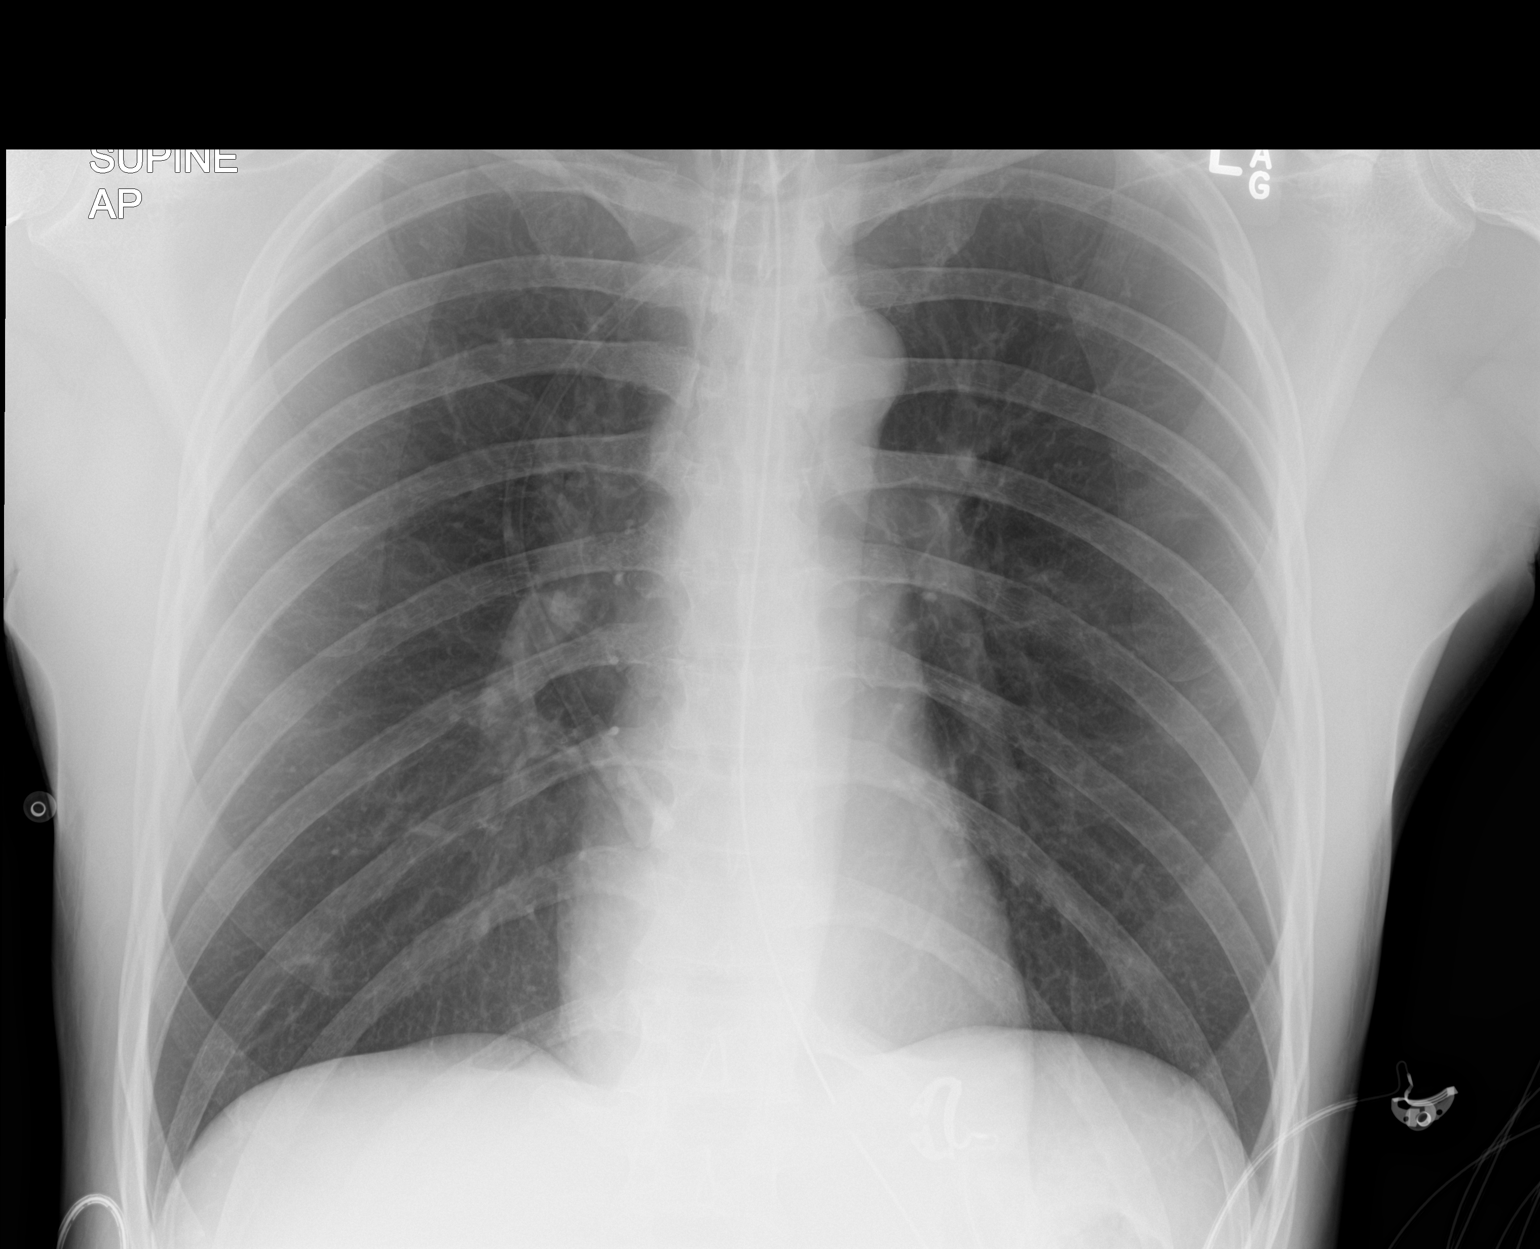

[1 of 1 positions shown; findings below may reference images not displayed]

FINDINGS: Endotracheal tube tip approximately 4.5 cm from carina. Enteric tube
tip below the field of view within the abdomen with proximal side
hole at the gastroesophageal junction. Clear lungs. Normal cardiac
silhouette. No effusion or pneumothorax. No acute osseous
abnormality is evident.
IMPRESSION: No acute pulmonary process identified. Endotracheal tube 4.5 cm from
carina. Enteric tube tip below the field of view in the abdomen.

By: Bochr Tot M.D.

## 2016-12-26 IMAGING — CR DG CHEST 1V PORT
1 series · 1 of 1 positions shown · non-contrast
Comparison: Radiographs January 12, 2016.

CLINICAL DATA: Oxygen desaturation.

EXAM:
PORTABLE CHEST 1 VIEW

[AP]
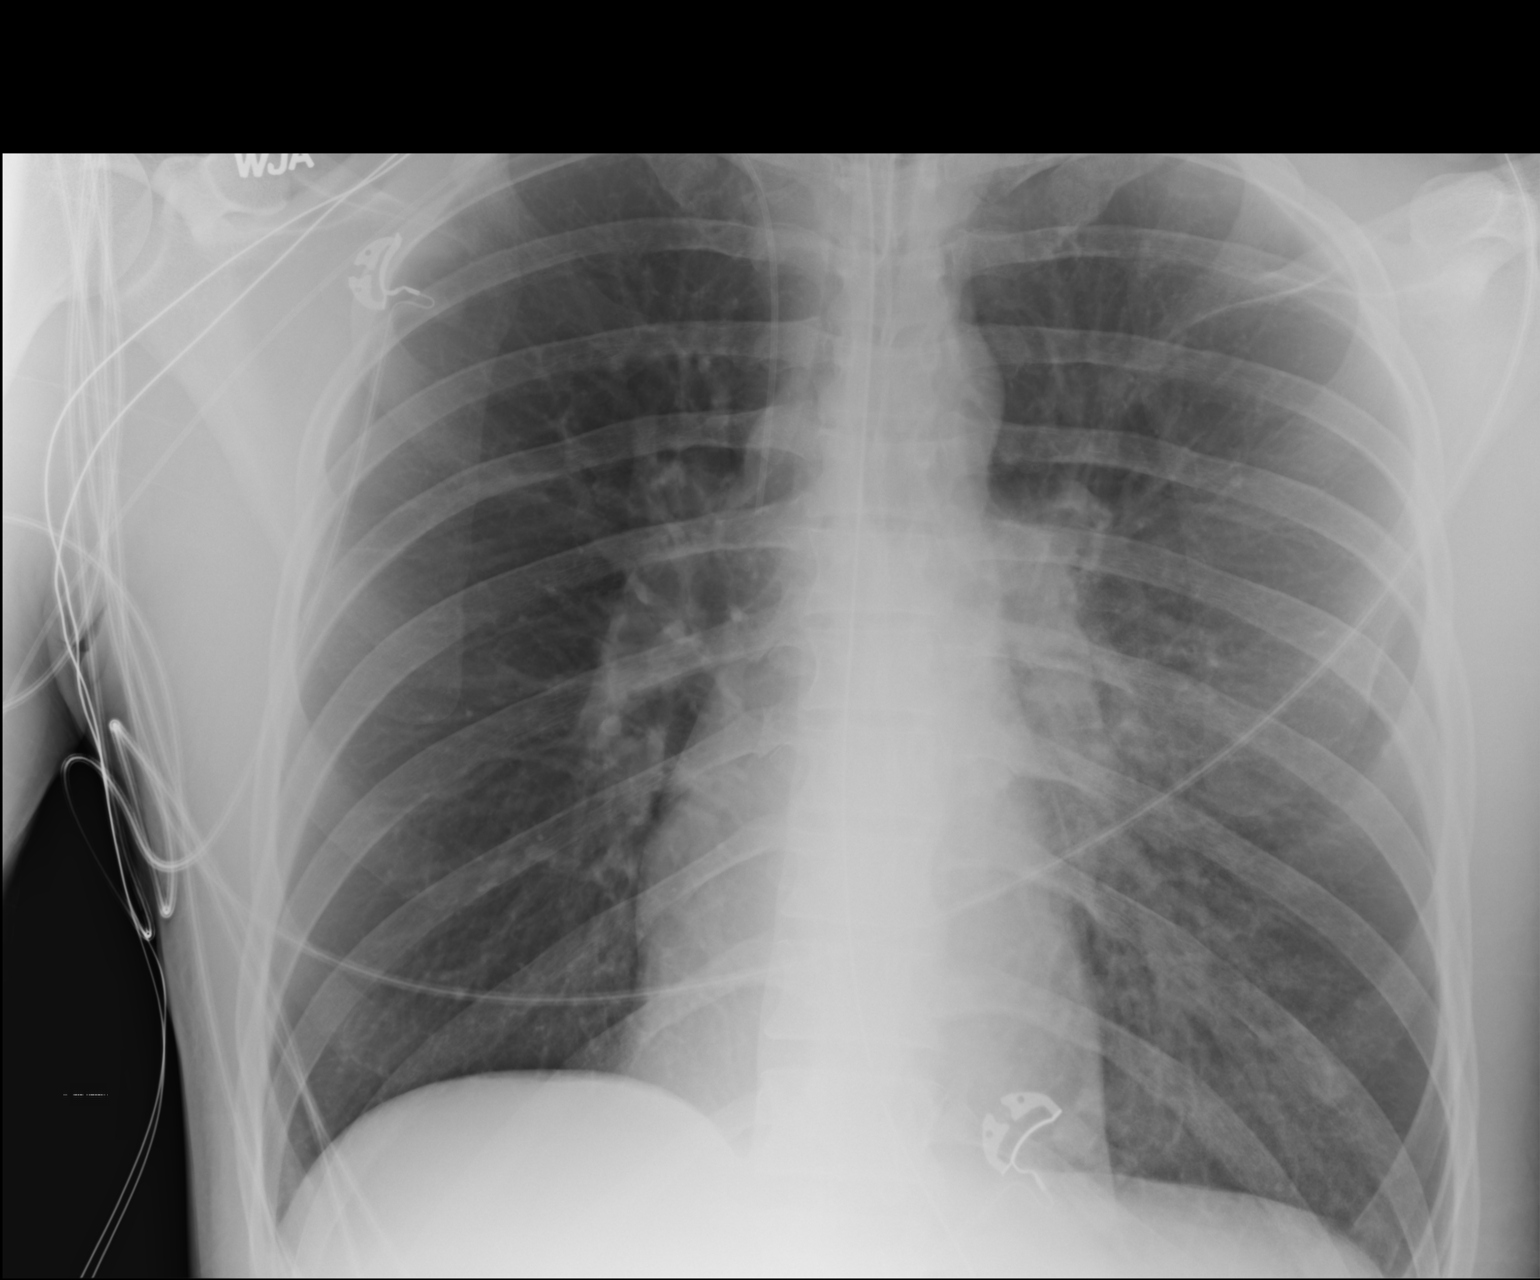

[1 of 1 positions shown; findings below may reference images not displayed]

FINDINGS: The heart size and mediastinal contours are within normal limits.
Endotracheal and nasogastric tubes are in good position. Interval
placement of right-sided PICC line with distal tip in expected
position of the SVC. Both lungs are clear. The visualized skeletal
structures are unremarkable.
IMPRESSION: Stable support apparatus. Interval placement of right-sided PICC
line with tip in expected position of the SVC. No acute
cardiopulmonary abnormality seen.

## 2017-01-23 ENCOUNTER — Telehealth: Payer: Self-pay | Admitting: Internal Medicine

## 2017-01-23 MED ORDER — ALBUTEROL SULFATE HFA 108 (90 BASE) MCG/ACT IN AERS: 1.0000 | INHALATION_SPRAY | Freq: Four times a day (QID) | RESPIRATORY_TRACT | 1 refills | Status: DC | PRN

## 2017-01-23 NOTE — Telephone Encounter (Signed)
Spoke with pt in the lobby, I asked SG if I could give the pt samples of Symbort 80 since we did not have symbicort 160. She stated this was okj since we did not have Dulera in stock either. Advised pt to call next week to see if we have Symbicort samples in. He agreed and verbalized understanding.  I advised pt if he was having trouble breathing he should go to the ER since he stated he has been out of his Symbicort for over a week now. I gave him a written Rx of albuterol rescue inhaler to take to the pharmacy. Nothing further is needed

## 2017-02-02 ENCOUNTER — Ambulatory Visit: Payer: Self-pay | Admitting: Internal Medicine

## 2017-06-10 ENCOUNTER — Telehealth: Payer: Self-pay | Admitting: Internal Medicine

## 2017-06-10 NOTE — Telephone Encounter (Signed)
Pt given samples in the lobby, nothing further needed.

## 2017-06-12 ENCOUNTER — Ambulatory Visit: Payer: Self-pay | Admitting: Internal Medicine

## 2017-08-04 ENCOUNTER — Telehealth: Payer: Self-pay | Admitting: Internal Medicine

## 2017-08-04 NOTE — Telephone Encounter (Signed)
Pt was last seen by TP 03/31/16 with the below stated plan:  Plan  Patient Instructions  Omnicef 300mg  Twice daily  For 1 week , take with food.  Continue on Symbicort 2 puffs Twice daily , rinse after use.  Use ProAir 2 puffs every 4hr as needed , this is your rescue inhaler  Follow up with Dr. Sherene SiresWert  In 3 months and As needed   Please contact office for sooner follow up if symptoms do not improve or worsen or seek emergency care    Pt has no showed 06/30/16, 09/04/16, 02/02/17, and 06/12/17.  Before pt can get any samples or any refills of meds from us, pt needs to schedule appt and keep the appt. At that time of the visit, pt can be able to get samples.  Went out to the lobby to explain this to pt and stated if he wanted to schedule an appt, I could take him to the check out to schedule an appt and if he kept the visit, then we could help him out with his meds.  Pt said thank you and left without scheduling an appt.  Nothing further needed.

## 2021-09-14 ENCOUNTER — Emergency Department (HOSPITAL_COMMUNITY)
Admission: EM | Admit: 2021-09-14 | Discharge: 2021-09-14 | Disposition: A | Discharge status: Home / Self Care | Payer: Medicaid Other | Attending: Emergency Medicine | Admitting: Emergency Medicine

## 2021-09-14 ENCOUNTER — Other Ambulatory Visit: Payer: Self-pay

## 2021-09-14 ENCOUNTER — Encounter (HOSPITAL_COMMUNITY): Payer: Self-pay | Admitting: Emergency Medicine

## 2021-09-14 DIAGNOSIS — Z760 Encounter for issue of repeat prescription: Secondary | ICD-10-CM | POA: Insufficient documentation

## 2021-09-14 DIAGNOSIS — Z794 Long term (current) use of insulin: Secondary | ICD-10-CM | POA: Insufficient documentation

## 2021-09-14 DIAGNOSIS — E1165 Type 2 diabetes mellitus with hyperglycemia: Secondary | ICD-10-CM | POA: Insufficient documentation

## 2021-09-14 DIAGNOSIS — R739 Hyperglycemia, unspecified: Secondary | ICD-10-CM

## 2021-09-14 HISTORY — DX: Type 2 diabetes mellitus without complications: E11.9

## 2021-09-14 LAB — BASIC METABOLIC PANEL
Anion gap: 8 (ref 5–15)
BUN: 20 mg/dL (ref 6–20)
CO2: 28 mmol/L (ref 22–32)
Calcium: 9.3 mg/dL (ref 8.9–10.3)
Chloride: 97 mmol/L — ABNORMAL LOW (ref 98–111)
Creatinine, Ser: 1.21 mg/dL (ref 0.61–1.24)
GFR, Estimated: >60 mL/min (ref >60)
Glucose, Bld: 386 mg/dL — ABNORMAL HIGH (ref 70–99)
Potassium: 4.5 mmol/L (ref 3.5–5.1)
Sodium: 133 mmol/L — ABNORMAL LOW (ref 135–145)

## 2021-09-14 MED ORDER — INSULIN ASPART 100 UNIT/ML IJ SOLN: 4.0000 [IU] | Freq: Three times a day (TID) | INTRAMUSCULAR | 11 refills | Status: DC

## 2021-09-14 NOTE — ED Triage Notes (Signed)
Pt to the ED with a request for prescription refill for Novalog.  Pt has recently moved here and has not yet established a PCP.

## 2021-09-14 NOTE — Discharge Instructions (Signed)
Your medications have been refilled  Please take your insulin today as prescribed  Please see the list of doctors below for family doctors in this area  [place] [facility] Doctor List    Syliva Overman, MD. Specialty: [facility] Medicine Contact information: [address]  [place] [address]  [phone]   Lilyan Punt, MD. Specialty: Family Medicine Contact information: [address]  [address]  [place] [address]  [phone]   Avon Gully, MD Specialty: Internal Medicine Contact information: [address] [place] [address]  [phone]   Catalina Pizza, MD. Specialty: Internal Medicine Contact information: [address]  [place] [address]  [phone]    [facility] (Dr. Selena Batten) Specialty: Family Medicine Contact information: [address]  [place] [address]  [phone]   John Giovanni, MD. Specialty: [facility] Medicine Contact information: [address]  [address]  [place] [address]  [phone]   Carylon Perches, MD. Specialty: Internal Medicine Contact information: [address]  [address]  [place] [address]  [phone]    [facility]  [address] [place], [place] [address] [phone]  Services [facility] offers a variety of basic health services.  Services include but are not limited to: Blood pressure checks  Heart rate checks  Blood sugar checks  Urine analysis  Rapid strep tests  Pregnancy tests.  Health education and referrals  People needing more complex services will be directed to a physician online. Using these virtual visits, doctors can evaluate and prescribe medicine and treatments. There will be no medication on-site, though [facility] will help patients fill their prescriptions at little to no cost.   For More information please go  to: [email or web address]

## 2021-09-14 NOTE — ED Provider Notes (Signed)
[facility] EMERGENCY DEPARTMENT Provider Note   CSN: [id number] Arrival date & time: 09/14/21  1054     History  Chief Complaint  Patient presents with   Medication Refill    Henry Hardin is a 36 y.o. male.   Medication Refill   This patient is a 36 year old male with a known history of diabetes, he takes both Lantus as well as NovoLog, he takes his medications every day but has been out of his NovoLog for about 1 week.  He reports in that timeframe he has developed some slight hyperglycemia ranging between 200- 300 but has not had any other symptoms, including urinary frequency, excessive thirst, fatigue, nausea or any other symptoms.  He is just concerned about getting a refill on his insulin.  He has recently moved here but is not followed up with a family doctor locally.  He has not run out of his Lantus for which she takes 32 units per evening  Home Medications Prior to Admission medications   Medication Sig Start Date End Date Taking? Authorizing Provider  insulin aspart (NOVOLOG) 100 UNIT/ML injection Inject 4 Units into the skin 3 (three) times daily before meals. To be used sliding scale (patient has scale at home) 09/14/21  Yes Eber Hong, MD  acetaminophen (TYLENOL) 500 MG tablet Take 500-1,000 mg by mouth every 6 (six) hours as needed (for pain).    [provider]  albuterol (PROVENTIL HFA;VENTOLIN HFA) 108 (90 Base) MCG/ACT inhaler Inhale 1-2 puffs into the lungs every 6 (six) hours as needed for wheezing or shortness of breath. 01/23/17   Nyoka Cowden, MD  budesonide-formoterol (SYMBICORT) 160-4.5 MCG/ACT inhaler Inhale 2 puffs into the lungs 2 (two) times daily. 03/31/16   Parrett, Virgel Bouquet, NP  budesonide-formoterol (SYMBICORT) 160-4.5 MCG/ACT inhaler Inhale 2 puffs into the lungs 2 (two) times daily. 07/31/16 08/01/16  Nyoka Cowden, MD  Fluticasone-Salmeterol (ADVAIR DISKUS) 250-50 MCG/DOSE AEPB Inhale 1 puff into the lungs 2 (two) times daily. 03/31/16  04/01/16  Parrett, Virgel Bouquet, NP      Allergies    Bee venom and Magnesium-containing compounds    Review of Systems   Review of Systems  All other systems reviewed and are negative.   Physical Exam Updated Vital Signs BP (!) 130/92 (BP Location: Left Arm)   Pulse 60   Temp 97.7 F (36.5 C) (Oral)   Resp 14   Ht 1.88 m (6\' 2" )   Wt 89.4 kg   SpO2 97%   BMI 25.29 kg/m  Physical Exam Vitals and nursing note reviewed.  Constitutional:      General: He is not in acute distress.    Appearance: He is well-developed.  HENT:     Head: Normocephalic and atraumatic.     Mouth/Throat:     Pharynx: No oropharyngeal exudate.  Eyes:     General: No scleral icterus.       Right eye: No discharge.        Left eye: No discharge.     Conjunctiva/sclera: Conjunctivae normal.     Pupils: Pupils are equal, round, and reactive to light.  Neck:     Thyroid: No thyromegaly.     Vascular: No JVD.  Cardiovascular:     Rate and Rhythm: Normal rate and regular rhythm.     Heart sounds: Normal heart sounds. No murmur heard.    No friction rub. No gallop.  Pulmonary:     Effort: Pulmonary effort is normal. No respiratory  distress.     Breath sounds: Normal breath sounds. No wheezing or rales.  Abdominal:     General: Bowel sounds are normal. There is no distension.     Palpations: Abdomen is soft. There is no mass.     Tenderness: There is no abdominal tenderness.  Musculoskeletal:        General: No tenderness. Normal range of motion.     Cervical back: Normal range of motion and neck supple.  Lymphadenopathy:     Cervical: No cervical adenopathy.  Skin:    General: Skin is warm and dry.     Findings: No erythema or rash.  Neurological:     Mental Status: He is alert.     Coordination: Coordination normal.  Psychiatric:        Behavior: Behavior normal.     ED Results / Procedures / Treatments   Labs (all labs ordered are listed, but only abnormal results are displayed) Labs  Reviewed  BASIC METABOLIC PANEL - Abnormal; Notable for the following components:      Result Value   Sodium 133 (*)    Chloride 97 (*)    Glucose, Bld 386 (*)    All other components within normal limits    EKG None  Radiology No results found.  Procedures Procedures    Medications Ordered in ED Medications - No data to display  ED Course/ Medical Decision Making/ A&P Clinical Course as of 09/14/21 1151  Sat Sep 14, 2021  1150 Glucose was 386 but no signs of acute metabolic acidosis [BM]    Clinical Course User Index [BM] Eber Hong, MD                           Medical Decision Making Amount and/or Complexity of Data Reviewed Labs: ordered.  Risk Prescription drug management.   Exam is unremarkable Check basic metabolic panel to rule out DKA Patient will have his medications refilled Patient's vital signs are unremarkable except for minimal hypertension        Final Clinical Impression(s) / ED Diagnoses Final diagnoses:  Hyperglycemia  Medication refill    Rx / DC Orders ED Discharge Orders          Ordered    insulin aspart (NOVOLOG) 100 UNIT/ML injection  3 times daily before meals        09/14/21 1114              Eber Hong, MD 09/14/21 1151

## 2021-11-11 ENCOUNTER — Other Ambulatory Visit: Payer: Self-pay

## 2021-11-11 ENCOUNTER — Emergency Department (HOSPITAL_COMMUNITY)
Admission: EM | Admit: 2021-11-11 | Discharge: 2021-11-11 | Disposition: A | Discharge status: Home / Self Care | Payer: 59 | Attending: Emergency Medicine | Admitting: Emergency Medicine

## 2021-11-11 DIAGNOSIS — E1065 Type 1 diabetes mellitus with hyperglycemia: Secondary | ICD-10-CM | POA: Diagnosis not present

## 2021-11-11 DIAGNOSIS — Z91198 Patient's noncompliance with other medical treatment and regimen for other reason: Secondary | ICD-10-CM | POA: Diagnosis not present

## 2021-11-11 DIAGNOSIS — R739 Hyperglycemia, unspecified: Secondary | ICD-10-CM

## 2021-11-11 DIAGNOSIS — Z91148 Patient's other noncompliance with medication regimen for other reason: Secondary | ICD-10-CM

## 2021-11-11 LAB — CBC
HCT: 45 % (ref 39.0–52.0)
Hemoglobin: 16 g/dL (ref 13.0–17.0)
MCH: 30.1 pg (ref 26.0–34.0)
MCHC: 35.6 g/dL (ref 30.0–36.0)
MCV: 84.7 fL (ref 80.0–100.0)
Platelets: 275 10*3/uL (ref 150–400)
RBC: 5.31 MIL/uL (ref 4.22–5.81)
RDW: 11.5 % (ref 11.5–15.5)
WBC: 5 10*3/uL (ref 4.0–10.5)
nRBC: 0 % (ref 0.0–0.2)

## 2021-11-11 LAB — COMPREHENSIVE METABOLIC PANEL
ALT: 25 U/L (ref 0–44)
AST: 18 U/L (ref 15–41)
Albumin: 4.3 g/dL (ref 3.5–5.0)
Alkaline Phosphatase: 114 U/L (ref 38–126)
Anion gap: 11 (ref 5–15)
BUN: 21 mg/dL — ABNORMAL HIGH (ref 6–20)
CO2: 27 mmol/L (ref 22–32)
Calcium: 9.4 mg/dL (ref 8.9–10.3)
Chloride: 94 mmol/L — ABNORMAL LOW (ref 98–111)
Creatinine, Ser: 1.4 mg/dL — ABNORMAL HIGH (ref 0.61–1.24)
GFR, Estimated: >60 mL/min (ref >60)
Glucose, Bld: 584 mg/dL (ref 70–99)
Potassium: 4.5 mmol/L (ref 3.5–5.1)
Sodium: 132 mmol/L — ABNORMAL LOW (ref 135–145)
Total Bilirubin: 0.8 mg/dL (ref 0.3–1.2)
Total Protein: 7.7 g/dL (ref 6.5–8.1)

## 2021-11-11 LAB — URINALYSIS, ROUTINE W REFLEX MICROSCOPIC
Bilirubin Urine: NEGATIVE
Glucose, UA: >=500 mg/dL — AB
Hgb urine dipstick: NEGATIVE
Ketones, ur: 20 mg/dL — AB
Leukocytes,Ua: NEGATIVE
Nitrite: NEGATIVE
Protein, ur: NEGATIVE mg/dL
Specific Gravity, Urine: 1.033 — ABNORMAL HIGH (ref 1.005–1.030)
pH: 5 (ref 5.0–8.0)

## 2021-11-11 LAB — CBG MONITORING, ED
Glucose-Capillary: >600 mg/dL (ref 70–99)
Glucose-Capillary: 238 mg/dL — ABNORMAL HIGH (ref 70–99)

## 2021-11-11 LAB — BLOOD GAS, VENOUS
Acid-Base Excess: 4.6 mmol/L — ABNORMAL HIGH (ref 0.0–2.0)
Bicarbonate: 31.4 mmol/L — ABNORMAL HIGH (ref 20.0–28.0)
Drawn by: 3812
O2 Saturation: 23.7 %
Patient temperature: 36.7
pCO2, Ven: 52 mmHg (ref 44–60)
pH, Ven: 7.38 (ref 7.25–7.43)
pO2, Ven: <31 mmHg — CL (ref 32–45)

## 2021-11-11 MED ORDER — INSULIN GLARGINE 100 UNIT/ML ~~LOC~~ SOLN: 32.0000 [IU] | Freq: Two times a day (BID) | SUBCUTANEOUS | 11 refills | Status: DC

## 2021-11-11 MED ORDER — INSULIN ASPART 100 UNIT/ML IJ SOLN: 4.0000 [IU] | Freq: Three times a day (TID) | INTRAMUSCULAR | 11 refills | Status: DC

## 2021-11-11 MED ORDER — LACTATED RINGERS IV BOLUS
1000.0000 mL | Freq: Once | INTRAVENOUS | Status: AC
  Administered 2021-11-11: 1000 mL via INTRAVENOUS

## 2021-11-11 MED ORDER — INSULIN ASPART 100 UNIT/ML IJ SOLN
15.0000 [IU] | Freq: Once | INTRAMUSCULAR | Status: AC
Start: 2021-11-11 — End: 2021-11-11
  Administered 2021-11-11: 15 [IU] via INTRAVENOUS
  Filled 2021-11-11: qty 1

## 2021-11-11 NOTE — ED Triage Notes (Signed)
Pt to the ED with complaints of hyperglycemia.  Pt has been without his lantus and novalog for the last 3 weeks.

## 2021-11-11 NOTE — Discharge Instructions (Addendum)
Your insulin - lantus and novolog, has been sent to the pharmacy. We recommend prompt follow up with pcp. We want you to check your blood sugar frequently to avoid drop in blood sugars.

## 2021-11-11 NOTE — ED Provider Notes (Signed)
[facility] EMERGENCY DEPARTMENT Provider Note   CSN: [id number] Arrival date & time: 11/11/21  1734     History  No chief complaint on file.   Henry Hardin is a 36 y.o. male.  HPI  36 year old male with history of type 1 diabetes or insulin-dependent diabetes comes in with chief complaint of elevated blood sugar.  Patient states that his blood sugar has been running high over the last 2 weeks as he has not taken his home Lantus or NovoLog for the last 3 weeks.  He essentially ran out of his refills.  He thinks that his PCP will only write more medication if they see him.  Patient has not had time to set up an appointment with PCP as he has been busy with work.  He decided to come to the ER as blood sugar has been reading high.  Patient admits to increased urinary frequency.  Denies any nausea, vomiting, fevers, chills. Patient states that his Lantus dose is 32 units twice daily and he also has NovoLog sliding scale on top of that.  He works in [employer], used to work as a Runner, broadcasting/film/video in [employer], academic.  He has 11 kids and cannot not work today.  He would like to be discharged ASAP as he is to work at 10 PM.  Home Medications Prior to Admission medications   Medication Sig Start Date End Date Taking? Authorizing Provider  insulin glargine (LANTUS) 100 UNIT/ML injection Inject 0.32 mLs (32 Units total) into the skin 2 (two) times daily. 11/11/21  Yes Varney Biles, MD  acetaminophen (TYLENOL) 500 MG tablet Take 500-1,000 mg by mouth every 6 (six) hours as needed (for pain).    [provider]  albuterol (PROVENTIL HFA;VENTOLIN HFA) 108 (90 Base) MCG/ACT inhaler Inhale 1-2 puffs into the lungs every 6 (six) hours as needed for wheezing or shortness of breath. 01/23/17   Tanda Rockers, MD  budesonide-formoterol (SYMBICORT) 160-4.5 MCG/ACT inhaler Inhale 2 puffs into the lungs 2 (two) times daily. 03/31/16   Parrett, Fonnie Mu, NP  budesonide-formoterol (SYMBICORT) 160-4.5 MCG/ACT inhaler  Inhale 2 puffs into the lungs 2 (two) times daily. 07/31/16 08/01/16  Tanda Rockers, MD  Fluticasone-Salmeterol (ADVAIR DISKUS) 250-50 MCG/DOSE AEPB Inhale 1 puff into the lungs 2 (two) times daily. 03/31/16 04/01/16  Parrett, Fonnie Mu, NP  insulin aspart (NOVOLOG) 100 UNIT/ML injection Inject 4 Units into the skin 3 (three) times daily before meals. To be used sliding scale (patient has scale at home) 11/11/21   Varney Biles, MD      Allergies    Bee venom and Magnesium-containing compounds    Review of Systems   Review of Systems  All other systems reviewed and are negative.   Physical Exam Updated Vital Signs BP 122/89   Pulse 74   Temp 98 F (36.7 C) (Oral)   Resp 17   Ht 6\' 2"  (1.88 m)   Wt 89.4 kg   SpO2 97%   BMI 25.31 kg/m  Physical Exam Vitals and nursing note reviewed.  Constitutional:      Appearance: He is well-developed.  HENT:     Head: Atraumatic.     Mouth/Throat:     Mouth: Mucous membranes are dry.  Cardiovascular:     Rate and Rhythm: Normal rate.  Pulmonary:     Effort: Pulmonary effort is normal.  Musculoskeletal:     Cervical back: Neck supple.  Skin:    General: Skin is warm.  Neurological:  Mental Status: He is alert and oriented to person, place, and time.     ED Results / Procedures / Treatments   Labs (all labs ordered are listed, but only abnormal results are displayed) Labs Reviewed  COMPREHENSIVE METABOLIC PANEL - Abnormal; Notable for the following components:      Result Value   Sodium 132 (*)    Chloride 94 (*)    Glucose, Bld 584 (*)    BUN 21 (*)    Creatinine, Ser 1.40 (*)    All other components within normal limits  BLOOD GAS, VENOUS - Abnormal; Notable for the following components:   pO2, Ven <31 (*)    Bicarbonate 31.4 (*)    Acid-Base Excess 4.6 (*)    All other components within normal limits  URINALYSIS, ROUTINE W REFLEX MICROSCOPIC - Abnormal; Notable for the following components:   Color, Urine COLORLESS  (*)    Specific Gravity, Urine 1.033 (*)    Glucose, UA >=500 (*)    Ketones, ur 20 (*)    Bacteria, UA RARE (*)    All other components within normal limits  CBG MONITORING, ED - Abnormal; Notable for the following components:   Glucose-Capillary >600 (*)    All other components within normal limits  CBG MONITORING, ED - Abnormal; Notable for the following components:   Glucose-Capillary 238 (*)    All other components within normal limits  CBC    EKG EKG Interpretation  Date/Time:  Monday November 11 2021 18:54:03 EDT Ventricular Rate:  61 PR Interval:  132 QRS Duration: 91 QT Interval:  407 QTC Calculation: 410 R Axis:   65 Text Interpretation: Sinus rhythm Inferolateral infarct, acute Borderline ST elevation, anterior leads St elevation in the lateral leads - early repo favored no reciprocal depression Confirmed by Derwood Kaplan [phone]) on 11/11/2021 7:32:35 PM  Radiology No results found.  Procedures .Critical Care  Performed by: Derwood Kaplan, MD Authorized by: Derwood Kaplan, MD   Critical care provider statement:    Critical care time (minutes):  30   Critical care was necessary to treat or prevent imminent or life-threatening deterioration of the following conditions:  Endocrine crisis   Critical care was time spent personally by me on the following activities:  Development of treatment plan with patient or surrogate, discussions with consultants, evaluation of patient's response to treatment, examination of patient, ordering and review of laboratory studies, ordering and review of radiographic studies, ordering and performing treatments and interventions, pulse oximetry, re-evaluation of patient's condition and review of old charts     Medications Ordered in ED Medications  lactated ringers bolus 1,000 mL (0 mLs Intravenous Stopped 11/11/21 2050)  insulin aspart (novoLOG) injection 15 Units (15 Units Intravenous Given 11/11/21 2012)    ED Course/ Medical  Decision Making/ A&P                           Medical Decision Making Risk Prescription drug management.   This patient presents to the ED with chief complaint(s) of abnormal blood sugar with pertinent past medical history of insulin-dependent diabetes and medication noncompliance.The complaint involves an extensive differential diagnosis and also carries with it a high risk of complications and morbidity.    The differential diagnosis includes hyperglycemia secondary to noncompliance, HHS, DKA, severe electrolyte abnormality, renal failure, severe dehydration.  The initial plan is to give patient liter of IV fluid. We will have to rule out DKA. We will also  give him insulin IV bolus to bring it down quickly given that patient wants to leave around 9 PM to be able to go to work today.  He has made it clear that he would not want to stay beyond that.  IV drip therefore not practical.   Additional history obtained: Records reviewed  previous ED visits.  Try to also review care everywhere to see if I can find patient's PCP visits and his insulin regimen, but unsuccessful.  Independent labs interpretation:  The following labs were independently interpreted: Patient has blood sugar close to 600, but anion gap is normal.  Bicarb is reassuring.  He is not in DKA.  Treatment and Reassessment: Patient's results discussed.  He has received 15 units of IV insulin. Repeat blood sugar ordered, it is in the 200s. We will give him insulin Lantus and NovoLog prescription.  Advised that he checks his blood sugar frequently in order to prevent hypoglycemic events.  Advised that he follows up with PCP as soon as possible for optimal management of his hyperglycemia.   Consideration for admission or further workup: Admission considered for profound hyperglycemia and unclear medication regimen.   Final Clinical Impression(s) / ED Diagnoses Final diagnoses:  Hyperglycemia  Noncompliance with medication  regimen    Rx / DC Orders ED Discharge Orders          Ordered    insulin glargine (LANTUS) 100 UNIT/ML injection  2 times daily        11/11/21 2000    insulin aspart (NOVOLOG) 100 UNIT/ML injection  3 times daily before meals        11/11/21 Theressa Millard, MD 11/11/21 2207

## 2021-11-11 NOTE — ED Notes (Signed)
Went over dc papers. All questions answered. Ambulatory to lobby .  

## 2022-07-28 ENCOUNTER — Emergency Department (HOSPITAL_COMMUNITY)
Admission: EM | Admit: 2022-07-28 | Discharge: 2022-07-29 | Disposition: A | Discharge status: Home / Self Care | Payer: Medicaid - Out of State | Attending: Emergency Medicine | Admitting: Emergency Medicine

## 2022-07-28 ENCOUNTER — Encounter (HOSPITAL_COMMUNITY): Payer: Self-pay

## 2022-07-28 ENCOUNTER — Other Ambulatory Visit: Payer: Self-pay

## 2022-07-28 DIAGNOSIS — Z5321 Procedure and treatment not carried out due to patient leaving prior to being seen by health care provider: Secondary | ICD-10-CM | POA: Diagnosis not present

## 2022-07-28 DIAGNOSIS — Z760 Encounter for issue of repeat prescription: Secondary | ICD-10-CM | POA: Insufficient documentation

## 2022-07-28 LAB — CBG MONITORING, ED: Glucose-Capillary: 255 mg/dL — ABNORMAL HIGH (ref 70–99)

## 2022-07-28 NOTE — ED Triage Notes (Signed)
Patient reports that he is out of Novolog & Lantus x 3 day & needs a refill for both.

## 2022-07-29 NOTE — ED Notes (Signed)
NA x3 room 

## 2022-10-18 ENCOUNTER — Ambulatory Visit
Admission: EM | Admit: 2022-10-18 | Discharge: 2022-10-18 | Disposition: A | Discharge status: Home / Self Care | Payer: Medicaid Other

## 2022-10-18 NOTE — ED Triage Notes (Signed)
After verification of patient information I called the pharmacy ([facility]) on behalf of patient to confirm if refills remain and/or visit with UC today is needed for "Rx refill of Insulin Only". After speaking to [employer]) in [place] he states the patient has [place] and the Lantus Rx was last filled at [facility] (and remains there) with 1 remaining prior to Expiration of original Rx. Novolog will need to be manually pulled and filled by this same pharamacy (as it is not expired). Currently patient is not experiencing any symptoms of concern "just needs refills".   Discussed with patient and Provider Billee Cashing PA C) and he will contact pharmacy and head there.  B. Roten CMA

## 2022-10-20 ENCOUNTER — Emergency Department (HOSPITAL_COMMUNITY)
Admission: EM | Admit: 2022-10-20 | Discharge: 2022-10-21 | Disposition: A | Discharge status: Home / Self Care | Payer: Medicaid Other | Attending: Emergency Medicine | Admitting: Emergency Medicine

## 2022-10-20 DIAGNOSIS — E860 Dehydration: Secondary | ICD-10-CM | POA: Diagnosis not present

## 2022-10-20 DIAGNOSIS — R197 Diarrhea, unspecified: Secondary | ICD-10-CM | POA: Diagnosis not present

## 2022-10-20 DIAGNOSIS — E1165 Type 2 diabetes mellitus with hyperglycemia: Secondary | ICD-10-CM | POA: Insufficient documentation

## 2022-10-20 DIAGNOSIS — R112 Nausea with vomiting, unspecified: Secondary | ICD-10-CM | POA: Insufficient documentation

## 2022-10-20 LAB — CBG MONITORING, ED: Glucose-Capillary: 335 mg/dL — ABNORMAL HIGH (ref 70–99)

## 2022-10-20 MED ORDER — ONDANSETRON HCL 4 MG/2ML IJ SOLN
4.0000 mg | Freq: Once | INTRAMUSCULAR | Status: AC
  Administered 2022-10-21: 4 mg via INTRAVENOUS
  Filled 2022-10-20: qty 2

## 2022-10-20 MED ORDER — LACTATED RINGERS IV BOLUS
1000.0000 mL | Freq: Once | INTRAVENOUS | Status: AC
  Administered 2022-10-21: 1000 mL via INTRAVENOUS

## 2022-10-20 NOTE — ED Provider Notes (Signed)
[facility]-EMERGENCY DEPT [facility] Emergency Department Provider Note MRN:  [id number]  Arrival date & time: 10/21/22     Chief Complaint   Nausea and Emesis   History of Present Illness   Henry Hardin is a 37 y.o. year-old male presents to the ED with chief complaint of nausea and vomiting.  States that he has diabetes.  States that he normally uses insulin, but his had difficulty getting his prescription filled.  He reports that he has been feeling nauseated and having vomiting for the past week, but symptoms acutely worsened today.  He states that he feels very dehydrated.  He denies any recent fever, chills, or cough.  States that he has had some slight diarrhea, but otherwise denies any other associated symptoms.  History provided by patient.   Review of Systems  Pertinent positive and negative review of systems noted in HPI.    Physical Exam   Vitals:   10/21/22 0230 10/21/22 0300  BP: 131/79 138/71  Pulse: 83 83  Resp: 17 (!) 23  Temp:    SpO2: 96% 94%    CONSTITUTIONAL:  dry-appearing, NAD NEURO:  Alert and oriented x 3, CN 3-12 grossly intact EYES:  eyes equal and reactive ENT/NECK:  Supple, no stridor  CARDIO:  normal rate, regular rhythm, appears well-perfused  PULM:  No respiratory distress, CTAB GI/GU:  non-distended, generalized abdominal discomfort MSK/SPINE:  No gross deformities, no edema, moves all extremities  SKIN:  no rash, atraumatic   *Additional and/or pertinent findings included in MDM below  Diagnostic and Interventional Summary    EKG Interpretation Date/Time:    Ventricular Rate:    PR Interval:    QRS Duration:    QT Interval:    QTC Calculation:   R Axis:      Text Interpretation:         Labs Reviewed  CBC WITH DIFFERENTIAL/PLATELET - Abnormal; Notable for the following components:      Result Value   WBC 10.7 (*)    RBC 5.83 (*)    Hemoglobin 17.1 (*)    Neutro Abs 9.6 (*)    Lymphs Abs 0.6 (*)    All other  components within normal limits  COMPREHENSIVE METABOLIC PANEL - Abnormal; Notable for the following components:   Sodium 134 (*)    Glucose, Bld 309 (*)    Total Protein 8.3 (*)    Total Bilirubin 1.3 (*)    All other components within normal limits  URINALYSIS, ROUTINE W REFLEX MICROSCOPIC - Abnormal; Notable for the following components:   Specific Gravity, Urine 1.035 (*)    Glucose, UA >=500 (*)    Ketones, ur 20 (*)    Protein, ur 30 (*)    All other components within normal limits  BLOOD GAS, VENOUS - Abnormal; Notable for the following components:   pCO2, Ven 38 (*)    pO2, Ven 51 (*)    Acid-base deficit 2.3 (*)    All other components within normal limits  CBG MONITORING, ED - Abnormal; Notable for the following components:   Glucose-Capillary 335 (*)    All other components within normal limits  I-STAT CHEM 8, ED - Abnormal; Notable for the following components:   Glucose, Bld 302 (*)    Calcium, Ion 0.97 (*)    TCO2 20 (*)    All other components within normal limits  CBG MONITORING, ED - Abnormal; Notable for the following components:   Glucose-Capillary 330 (*)  All other components within normal limits  CBG MONITORING, ED - Abnormal; Notable for the following components:   Glucose-Capillary 296 (*)    All other components within normal limits    No orders to display    Medications  lactated ringers bolus 1,000 mL (0 mLs Intravenous Stopped 10/21/22 0313)  ondansetron (ZOFRAN) injection 4 mg (4 mg Intravenous Given 10/21/22 0043)  metoCLOPramide (REGLAN) injection 10 mg (10 mg Intravenous Given 10/21/22 0118)  lactated ringers bolus 1,000 mL (1,000 mLs Intravenous New Bag/Given 10/21/22 0322)  insulin aspart (novoLOG) injection 10 Units (10 Units Subcutaneous Given 10/21/22 0322)  lactated ringers bolus 1,000 mL (1,000 mLs Intravenous New Bag/Given 10/21/22 0323)     Procedures  /  Critical Care Procedures  ED Course and Medical Decision Making  I have reviewed the  triage vital signs, the nursing notes, and pertinent available records from the EMR.  Social Determinants Affecting Complexity of Care: Patient has no clinically significant social determinants affecting this chief complaint..   ED Course:    Medical Decision Making Patient here with nausea and vomiting.  States that he is out of his insulin.  He states that he feels dehydrated.    Will check labs and give fluids.  Patient appears dry.  4:39 AM Patient reassessed.  He states that he is feeling much better.  Will plan for discharge home.  Rx for zofran and insulin.  Amount and/or Complexity of Data Reviewed Labs: ordered.    Details: Glucose 330 VBG shows normal venous pH, doubt DKA  Risk Prescription drug management.         Consultants: No consultations were needed in caring for this patient.   Treatment and Plan: I considered admission due to patient's initial presentation, but after considering the examination and diagnostic results, patient will not require admission and can be discharged with outpatient follow-up.    Final Clinical Impressions(s) / ED Diagnoses     ICD-10-CM   1. Nausea and vomiting, unspecified vomiting type  R11.2       ED Discharge Orders          Ordered    ondansetron (ZOFRAN-ODT) 4 MG disintegrating tablet  Every 8 hours PRN        10/21/22 0437              Discharge Instructions Discussed with and Provided to Patient:   Discharge Instructions   None      Roxy Horseman, PA-C 10/21/22 0440    Nira Conn, MD 10/21/22 [phone]

## 2022-10-21 ENCOUNTER — Other Ambulatory Visit: Payer: Self-pay

## 2022-10-21 ENCOUNTER — Encounter (HOSPITAL_COMMUNITY): Payer: Self-pay

## 2022-10-21 LAB — URINALYSIS, ROUTINE W REFLEX MICROSCOPIC
Bilirubin Urine: NEGATIVE
Glucose, UA: >=500 mg/dL — AB
Hgb urine dipstick: NEGATIVE
Ketones, ur: 20 mg/dL — AB
Leukocytes,Ua: NEGATIVE
Nitrite: NEGATIVE
Protein, ur: 30 mg/dL — AB
Specific Gravity, Urine: 1.035 — ABNORMAL HIGH (ref 1.005–1.030)
pH: 5 (ref 5.0–8.0)

## 2022-10-21 LAB — BLOOD GAS, VENOUS
Acid-base deficit: 2.3 mmol/L — ABNORMAL HIGH (ref 0.0–2.0)
Bicarbonate: 22.5 mmol/L (ref 20.0–28.0)
O2 Saturation: 84.3 %
Patient temperature: 37
pCO2, Ven: 38 mmHg — ABNORMAL LOW (ref 44–60)
pH, Ven: 7.38 (ref 7.25–7.43)
pO2, Ven: 51 mmHg — ABNORMAL HIGH (ref 32–45)

## 2022-10-21 LAB — CBC WITH DIFFERENTIAL/PLATELET
Abs Immature Granulocytes: 0.03 10*3/uL (ref 0.00–0.07)
Basophils Absolute: 0 10*3/uL (ref 0.0–0.1)
Basophils Relative: 0 %
Eosinophils Absolute: 0 10*3/uL (ref 0.0–0.5)
Eosinophils Relative: 0 %
HCT: 49.4 % (ref 39.0–52.0)
Hemoglobin: 17.1 g/dL — ABNORMAL HIGH (ref 13.0–17.0)
Immature Granulocytes: 0 %
Lymphocytes Relative: 6 %
Lymphs Abs: 0.6 10*3/uL — ABNORMAL LOW (ref 0.7–4.0)
MCH: 29.3 pg (ref 26.0–34.0)
MCHC: 34.6 g/dL (ref 30.0–36.0)
MCV: 84.7 fL (ref 80.0–100.0)
Monocytes Absolute: 0.4 10*3/uL (ref 0.1–1.0)
Monocytes Relative: 4 %
Neutro Abs: 9.6 10*3/uL — ABNORMAL HIGH (ref 1.7–7.7)
Neutrophils Relative %: 90 %
Platelets: 272 10*3/uL (ref 150–400)
RBC: 5.83 MIL/uL — ABNORMAL HIGH (ref 4.22–5.81)
RDW: 11.5 % (ref 11.5–15.5)
WBC: 10.7 10*3/uL — ABNORMAL HIGH (ref 4.0–10.5)
nRBC: 0 % (ref 0.0–0.2)

## 2022-10-21 LAB — I-STAT CHEM 8, ED
BUN: 19 mg/dL (ref 6–20)
Calcium, Ion: 0.97 mmol/L — ABNORMAL LOW (ref 1.15–1.40)
Chloride: 105 mmol/L (ref 98–111)
Creatinine, Ser: 0.7 mg/dL (ref 0.61–1.24)
Glucose, Bld: 302 mg/dL — ABNORMAL HIGH (ref 70–99)
HCT: 48 % (ref 39.0–52.0)
Hemoglobin: 16.3 g/dL (ref 13.0–17.0)
Potassium: 4 mmol/L (ref 3.5–5.1)
Sodium: 135 mmol/L (ref 135–145)
TCO2: 20 mmol/L — ABNORMAL LOW (ref 22–32)

## 2022-10-21 LAB — COMPREHENSIVE METABOLIC PANEL
ALT: 25 U/L (ref 0–44)
AST: 20 U/L (ref 15–41)
Albumin: 4.6 g/dL (ref 3.5–5.0)
Alkaline Phosphatase: 110 U/L (ref 38–126)
Anion gap: 14 (ref 5–15)
BUN: 19 mg/dL (ref 6–20)
CO2: 22 mmol/L (ref 22–32)
Calcium: 9 mg/dL (ref 8.9–10.3)
Chloride: 98 mmol/L (ref 98–111)
Creatinine, Ser: 1.02 mg/dL (ref 0.61–1.24)
GFR, Estimated: >60 mL/min (ref >60)
Glucose, Bld: 309 mg/dL — ABNORMAL HIGH (ref 70–99)
Potassium: 3.7 mmol/L (ref 3.5–5.1)
Sodium: 134 mmol/L — ABNORMAL LOW (ref 135–145)
Total Bilirubin: 1.3 mg/dL — ABNORMAL HIGH (ref 0.3–1.2)
Total Protein: 8.3 g/dL — ABNORMAL HIGH (ref 6.5–8.1)

## 2022-10-21 LAB — CBG MONITORING, ED
Glucose-Capillary: 330 mg/dL — ABNORMAL HIGH (ref 70–99)
Glucose-Capillary: 296 mg/dL — ABNORMAL HIGH (ref 70–99)

## 2022-10-21 MED ORDER — INSULIN ASPART 100 UNIT/ML IJ SOLN
10.0000 [IU] | Freq: Once | INTRAMUSCULAR | Status: AC
  Administered 2022-10-21: 10 [IU] via SUBCUTANEOUS
  Filled 2022-10-21: qty 0.1

## 2022-10-21 MED ORDER — LACTATED RINGERS IV BOLUS
1000.0000 mL | Freq: Once | INTRAVENOUS | Status: AC
  Administered 2022-10-21: 1000 mL via INTRAVENOUS

## 2022-10-21 MED ORDER — ONDANSETRON 4 MG PO TBDP: 4.0000 mg | ORAL_TABLET | Freq: Three times a day (TID) | ORAL | 0 refills | Status: DC | PRN

## 2022-10-21 MED ORDER — METOCLOPRAMIDE HCL 5 MG/ML IJ SOLN
10.0000 mg | Freq: Once | INTRAMUSCULAR | Status: AC
  Administered 2022-10-21: 10 mg via INTRAVENOUS
  Filled 2022-10-21: qty 2

## 2022-10-21 MED ORDER — INSULIN ASPART 100 UNIT/ML IJ SOLN: 4.0000 [IU] | Freq: Three times a day (TID) | INTRAMUSCULAR | 0 refills | Status: DC

## 2022-10-21 NOTE — ED Triage Notes (Signed)
Pt arrives via [facility] with c/o nausea and vomiting as well as elevated blood sugar. Pt reported to EMS "I felt like this the last time I had pneumonia." Pt received 4mg  IV zofran, and a bolus of NS en route and vomiting has stopped at this time.

## 2023-02-26 ENCOUNTER — Other Ambulatory Visit: Payer: Self-pay

## 2023-02-26 ENCOUNTER — Encounter (HOSPITAL_COMMUNITY): Payer: Self-pay | Admitting: *Deleted

## 2023-02-26 ENCOUNTER — Observation Stay (HOSPITAL_COMMUNITY)
Admission: EM | Admit: 2023-02-26 | Discharge: 2023-02-27 | Disposition: A | Discharge status: Home / Self Care | Payer: Medicaid Other | Attending: Emergency Medicine | Admitting: Emergency Medicine

## 2023-02-26 ENCOUNTER — Emergency Department (HOSPITAL_COMMUNITY): Payer: Medicaid Other

## 2023-02-26 DIAGNOSIS — Z5902 Unsheltered homelessness: Secondary | ICD-10-CM | POA: Diagnosis not present

## 2023-02-26 DIAGNOSIS — E1110 Type 2 diabetes mellitus with ketoacidosis without coma: Secondary | ICD-10-CM | POA: Diagnosis present

## 2023-02-26 DIAGNOSIS — N179 Acute kidney failure, unspecified: Secondary | ICD-10-CM | POA: Insufficient documentation

## 2023-02-26 DIAGNOSIS — K029 Dental caries, unspecified: Secondary | ICD-10-CM | POA: Diagnosis not present

## 2023-02-26 DIAGNOSIS — E860 Dehydration: Secondary | ICD-10-CM | POA: Diagnosis not present

## 2023-02-26 DIAGNOSIS — E1065 Type 1 diabetes mellitus with hyperglycemia: Secondary | ICD-10-CM | POA: Diagnosis present

## 2023-02-26 DIAGNOSIS — E871 Hypo-osmolality and hyponatremia: Secondary | ICD-10-CM | POA: Diagnosis present

## 2023-02-26 DIAGNOSIS — J4520 Mild intermittent asthma, uncomplicated: Secondary | ICD-10-CM | POA: Diagnosis not present

## 2023-02-26 DIAGNOSIS — Z87891 Personal history of nicotine dependence: Secondary | ICD-10-CM | POA: Diagnosis not present

## 2023-02-26 DIAGNOSIS — E1010 Type 1 diabetes mellitus with ketoacidosis without coma: Secondary | ICD-10-CM | POA: Diagnosis not present

## 2023-02-26 DIAGNOSIS — E10649 Type 1 diabetes mellitus with hypoglycemia without coma: Secondary | ICD-10-CM | POA: Insufficient documentation

## 2023-02-26 DIAGNOSIS — J45909 Unspecified asthma, uncomplicated: Secondary | ICD-10-CM | POA: Diagnosis present

## 2023-02-26 DIAGNOSIS — E876 Hypokalemia: Secondary | ICD-10-CM | POA: Diagnosis not present

## 2023-02-26 DIAGNOSIS — R112 Nausea with vomiting, unspecified: Secondary | ICD-10-CM | POA: Diagnosis present

## 2023-02-26 LAB — URINALYSIS, ROUTINE W REFLEX MICROSCOPIC
Bacteria, UA: NONE SEEN
Bilirubin Urine: NEGATIVE
Glucose, UA: >=500 mg/dL — AB
Hgb urine dipstick: NEGATIVE
Ketones, ur: 80 mg/dL — AB
Leukocytes,Ua: NEGATIVE
Nitrite: NEGATIVE
Protein, ur: NEGATIVE mg/dL
Specific Gravity, Urine: 1.025 (ref 1.005–1.030)
pH: 5 (ref 5.0–8.0)

## 2023-02-26 LAB — CBG MONITORING, ED
Glucose-Capillary: >600 mg/dL (ref 70–99)
Glucose-Capillary: 402 mg/dL — ABNORMAL HIGH (ref 70–99)
Glucose-Capillary: 398 mg/dL — ABNORMAL HIGH (ref 70–99)
Glucose-Capillary: 435 mg/dL — ABNORMAL HIGH (ref 70–99)
Glucose-Capillary: 432 mg/dL — ABNORMAL HIGH (ref 70–99)
Glucose-Capillary: 267 mg/dL — ABNORMAL HIGH (ref 70–99)
Glucose-Capillary: 199 mg/dL — ABNORMAL HIGH (ref 70–99)
Glucose-Capillary: 89 mg/dL (ref 70–99)
Glucose-Capillary: 81 mg/dL (ref 70–99)

## 2023-02-26 LAB — BASIC METABOLIC PANEL
Anion gap: 11 (ref 5–15)
BUN: 16 mg/dL (ref 6–20)
CO2: 24 mmol/L (ref 22–32)
Calcium: 9 mg/dL (ref 8.9–10.3)
Chloride: 99 mmol/L (ref 98–111)
Creatinine, Ser: 0.93 mg/dL (ref 0.61–1.24)
GFR, Estimated: >60 mL/min (ref >60)
Glucose, Bld: 110 mg/dL — ABNORMAL HIGH (ref 70–99)
Potassium: 3.9 mmol/L (ref 3.5–5.1)
Sodium: 134 mmol/L — ABNORMAL LOW (ref 135–145)

## 2023-02-26 LAB — BLOOD GAS, VENOUS
Acid-base deficit: 9.2 mmol/L — ABNORMAL HIGH (ref 0.0–2.0)
Bicarbonate: 17.5 mmol/L — ABNORMAL LOW (ref 20.0–28.0)
O2 Saturation: 72.7 %
Patient temperature: 37
pCO2, Ven: 40 mm[Hg] — ABNORMAL LOW (ref 44–60)
pH, Ven: 7.25 (ref 7.25–7.43)
pO2, Ven: 44 mm[Hg] (ref 32–45)

## 2023-02-26 LAB — CBC WITH DIFFERENTIAL/PLATELET
Abs Immature Granulocytes: 0.02 10*3/uL (ref 0.00–0.07)
Basophils Absolute: 0 10*3/uL (ref 0.0–0.1)
Basophils Relative: 0 %
Eosinophils Absolute: 0 10*3/uL (ref 0.0–0.5)
Eosinophils Relative: 0 %
HCT: 46.3 % (ref 39.0–52.0)
Hemoglobin: 16.1 g/dL (ref 13.0–17.0)
Immature Granulocytes: 0 %
Lymphocytes Relative: 16 %
Lymphs Abs: 1.2 10*3/uL (ref 0.7–4.0)
MCH: 30.5 pg (ref 26.0–34.0)
MCHC: 34.8 g/dL (ref 30.0–36.0)
MCV: 87.7 fL (ref 80.0–100.0)
Monocytes Absolute: 0.3 10*3/uL (ref 0.1–1.0)
Monocytes Relative: 4 %
Neutro Abs: 5.7 10*3/uL (ref 1.7–7.7)
Neutrophils Relative %: 80 %
Platelets: 294 10*3/uL (ref 150–400)
RBC: 5.28 MIL/uL (ref 4.22–5.81)
RDW: 11.8 % (ref 11.5–15.5)
WBC: 7.1 10*3/uL (ref 4.0–10.5)
nRBC: 0 % (ref 0.0–0.2)

## 2023-02-26 LAB — BETA-HYDROXYBUTYRIC ACID
Beta-Hydroxybutyric Acid: 7.43 mmol/L — ABNORMAL HIGH (ref 0.05–0.27)
Beta-Hydroxybutyric Acid: 2.02 mmol/L — ABNORMAL HIGH (ref 0.05–0.27)

## 2023-02-26 LAB — COMPREHENSIVE METABOLIC PANEL
ALT: 36 U/L (ref 0–44)
AST: 21 U/L (ref 15–41)
Albumin: 4.4 g/dL (ref 3.5–5.0)
Alkaline Phosphatase: 150 U/L — ABNORMAL HIGH (ref 38–126)
Anion gap: 20 — ABNORMAL HIGH (ref 5–15)
BUN: 19 mg/dL (ref 6–20)
CO2: 16 mmol/L — ABNORMAL LOW (ref 22–32)
Calcium: 9.5 mg/dL (ref 8.9–10.3)
Chloride: 89 mmol/L — ABNORMAL LOW (ref 98–111)
Creatinine, Ser: 1.35 mg/dL — ABNORMAL HIGH (ref 0.61–1.24)
GFR, Estimated: >60 mL/min (ref >60)
Glucose, Bld: 785 mg/dL (ref 70–99)
Potassium: 5.1 mmol/L (ref 3.5–5.1)
Sodium: 125 mmol/L — ABNORMAL LOW (ref 135–145)
Total Bilirubin: 1.7 mg/dL — ABNORMAL HIGH (ref 0.0–1.2)
Total Protein: 8.6 g/dL — ABNORMAL HIGH (ref 6.5–8.1)

## 2023-02-26 LAB — I-STAT CHEM 8, ED
BUN: 19 mg/dL (ref 6–20)
Calcium, Ion: 1.18 mmol/L (ref 1.15–1.40)
Chloride: 92 mmol/L — ABNORMAL LOW (ref 98–111)
Creatinine, Ser: 1 mg/dL (ref 0.61–1.24)
Glucose, Bld: >700 mg/dL (ref 70–99)
HCT: 51 % (ref 39.0–52.0)
Hemoglobin: 17.3 g/dL — ABNORMAL HIGH (ref 13.0–17.0)
Potassium: 5.1 mmol/L (ref 3.5–5.1)
Sodium: 125 mmol/L — ABNORMAL LOW (ref 135–145)
TCO2: 16 mmol/L — ABNORMAL LOW (ref 22–32)

## 2023-02-26 LAB — GLUCOSE, CAPILLARY
Glucose-Capillary: 170 mg/dL — ABNORMAL HIGH (ref 70–99)
Glucose-Capillary: 238 mg/dL — ABNORMAL HIGH (ref 70–99)

## 2023-02-26 LAB — MRSA NEXT GEN BY PCR, NASAL: MRSA by PCR Next Gen: NOT DETECTED

## 2023-02-26 MED ORDER — DEXTROSE 50 % IV SOLN: 0.0000 mL | INTRAVENOUS | Status: DC | PRN

## 2023-02-26 MED ORDER — INSULIN REGULAR(HUMAN) IN NACL 100-0.9 UT/100ML-% IV SOLN
INTRAVENOUS | Status: DC
Start: 2023-02-26 — End: 2023-02-27
  Filled 2023-02-26: qty 100

## 2023-02-26 MED ORDER — LACTATED RINGERS IV SOLN
INTRAVENOUS | Status: DC
Start: 2023-02-26 — End: 2023-02-27

## 2023-02-26 MED ORDER — INSULIN REGULAR(HUMAN) IN NACL 100-0.9 UT/100ML-% IV SOLN
INTRAVENOUS | Status: DC
  Administered 2023-02-26: 12 [IU]/h via INTRAVENOUS
  Filled 2023-02-26: qty 100

## 2023-02-26 MED ORDER — ONDANSETRON HCL 4 MG/2ML IJ SOLN
4.0000 mg | Freq: Once | INTRAMUSCULAR | Status: AC
  Administered 2023-02-26: 4 mg via INTRAVENOUS
  Filled 2023-02-26: qty 2

## 2023-02-26 MED ORDER — SODIUM CHLORIDE 0.9 % IV SOLN: INTRAVENOUS | Status: DC

## 2023-02-26 MED ORDER — ORAL CARE MOUTH RINSE: 15.0000 mL | OROMUCOSAL | Status: DC | PRN

## 2023-02-26 MED ORDER — SODIUM CHLORIDE 0.9 % IV BOLUS
1000.0000 mL | Freq: Once | INTRAVENOUS | Status: AC
  Administered 2023-02-26: 1000 mL via INTRAVENOUS

## 2023-02-26 MED ORDER — DEXTROSE IN LACTATED RINGERS 5 % IV SOLN: INTRAVENOUS | Status: AC

## 2023-02-26 MED ORDER — AMOXICILLIN-POT CLAVULANATE 875-125 MG PO TABS
1.0000 | ORAL_TABLET | Freq: Two times a day (BID) | ORAL | Status: DC
  Administered 2023-02-26 – 2023-02-27 (×2): 1 via ORAL
  Filled 2023-02-26 (×2): qty 1

## 2023-02-26 MED ORDER — DEXTROSE IN LACTATED RINGERS 5 % IV SOLN: INTRAVENOUS | Status: DC

## 2023-02-26 MED ORDER — CHLORHEXIDINE GLUCONATE CLOTH 2 % EX PADS
6.0000 | MEDICATED_PAD | Freq: Every day | CUTANEOUS | Status: DC
  Administered 2023-02-26: 6 via TOPICAL

## 2023-02-26 MED ORDER — ONDANSETRON HCL 4 MG/2ML IJ SOLN: 4.0000 mg | Freq: Four times a day (QID) | INTRAMUSCULAR | Status: DC | PRN

## 2023-02-26 MED ORDER — INSULIN ASPART 100 UNIT/ML IJ SOLN
8.0000 [IU] | Freq: Once | INTRAMUSCULAR | Status: AC
  Administered 2023-02-26: 8 [IU] via INTRAVENOUS
  Filled 2023-02-26: qty 0.08

## 2023-02-26 MED ORDER — ACETAMINOPHEN 325 MG PO TABS
650.0000 mg | ORAL_TABLET | ORAL | Status: DC | PRN
  Administered 2023-02-27 (×2): 650 mg via ORAL
  Filled 2023-02-26 (×2): qty 2
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
## 2023-02-26 NOTE — ED Triage Notes (Addendum)
 BIB EMS out of insulin for 2 weeks. Not feeling well since taking his insulin, freq urination, no bm in days, headache. CBG 275-130/90-80-No IV  Pt adds mouth pain ? Abscess on left jaw

## 2023-02-26 NOTE — ED Notes (Signed)
 Pt is refusing any treatment at this time until he is able to eat something. Provider is aware.

## 2023-02-26 NOTE — ED Notes (Signed)
 NO PHARMACY  WILL NOT REMOVE FROM CHART.
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
## 2023-02-26 NOTE — ED Provider Notes (Signed)
 [place] EMERGENCY DEPARTMENT AT [facility] Provider Note   CSN: [id number] Arrival date & time: 02/26/23  1102     History  Chief Complaint  Patient presents with   Hyperglycemia   Not Feeling Well    Henry Hardin is a 38 y.o. male.  Pt is a 38 yo male with pmhx significant for DM1 and asthma.  Pt lost his insurance and has been out of his lantus  and his novolog  pen for about a month.  He has been using the otc insulin  from [employer], but does not think it's been bringing down his sugar.  He has pain everywhere and has developed an abscess in his lower left tooth.         Home Medications Prior to Admission medications   Medication Sig Start Date End Date Taking? Authorizing Provider  albuterol  (PROVENTIL  HFA;VENTOLIN  HFA) 108 (90 Base) MCG/ACT inhaler Inhale 1-2 puffs into the lungs every 6 (six) hours as needed for wheezing or shortness of breath. Patient not taking: Reported on 10/21/2022 01/23/17   Wert, Michael B, MD  budesonide -formoterol  (SYMBICORT ) 160-4.5 MCG/ACT inhaler Inhale 2 puffs into the lungs 2 (two) times daily. Patient not taking: Reported on 10/21/2022 03/31/16   Parrett, Madelin RAMAN, NP  budesonide -formoterol  (SYMBICORT ) 160-4.5 MCG/ACT inhaler Inhale 2 puffs into the lungs 2 (two) times daily. 07/31/16 08/01/16  Darlean Ozell NOVAK, MD  Fluticasone -Salmeterol (ADVAIR DISKUS) 250-50 MCG/DOSE AEPB Inhale 1 puff into the lungs 2 (two) times daily. 03/31/16 04/01/16  Parrett, Madelin RAMAN, NP  insulin  aspart (NOVOLOG ) 100 UNIT/ML injection Inject 4 Units into the skin 3 (three) times daily before meals. To be used sliding scale (patient has scale at home) 10/21/22   Vicky Charleston, PA-C  insulin  glargine (LANTUS ) 100 UNIT/ML injection Inject 0.32 mLs (32 Units total) into the skin 2 (two) times daily. Patient not taking: Reported on 10/21/2022 11/11/21   Charlyn Sora, MD  ondansetron  (ZOFRAN -ODT) 4 MG disintegrating tablet Take 1 tablet (4 mg total) by mouth every 8  (eight) hours as needed for nausea or vomiting. 10/21/22   Vicky Charleston, PA-C      Allergies    Bee venom and Magnesium -containing compounds    Review of Systems   Review of Systems  HENT:  Positive for dental problem.   Endocrine: Positive for polydipsia and polyuria.  All other systems reviewed and are negative.   Physical Exam Updated Vital Signs BP (!) 142/101   Pulse 85   Temp 97.7 F (36.5 C) (Oral)   Resp 16   Ht 6' 2 (1.88 m)   Wt 81.6 kg   SpO2 98%   BMI 23.11 kg/m  Physical Exam Vitals and nursing note reviewed.  Constitutional:      Appearance: Normal appearance. He is ill-appearing.  HENT:     Head: Normocephalic and atraumatic.     Comments: Swelling and pain to left lower jaw.  Nothing to drain.  No Ludwig's    Right Ear: External ear normal.     Left Ear: External ear normal.     Nose: Nose normal.     Mouth/Throat:     Mouth: Mucous membranes are dry.  Eyes:     Extraocular Movements: Extraocular movements intact.     Conjunctiva/sclera: Conjunctivae normal.     Pupils: Pupils are equal, round, and reactive to light.  Cardiovascular:     Rate and Rhythm: Normal rate and regular rhythm.     Pulses: Normal pulses.  Heart sounds: Normal heart sounds.  Pulmonary:     Effort: Pulmonary effort is normal.     Breath sounds: Normal breath sounds.  Abdominal:     General: Abdomen is flat. Bowel sounds are normal.     Palpations: Abdomen is soft.  Musculoskeletal:        General: Normal range of motion.     Cervical back: Normal range of motion and neck supple.  Skin:    General: Skin is warm.     Capillary Refill: Capillary refill takes less than 2 seconds.  Neurological:     Mental Status: He is alert and oriented to person, place, and time.  Psychiatric:        Mood and Affect: Mood normal.        Behavior: Behavior normal.     ED Results / Procedures / Treatments   Labs (all labs ordered are listed, but only abnormal results are  displayed) Labs Reviewed  COMPREHENSIVE METABOLIC PANEL - Abnormal; Notable for the following components:      Result Value   Sodium 125 (*)    Chloride 89 (*)    CO2 16 (*)    Glucose, Bld 785 (*)    Creatinine, Ser 1.35 (*)    Total Protein 8.6 (*)    Alkaline Phosphatase 150 (*)    Total Bilirubin 1.7 (*)    Anion gap 20 (*)    All other components within normal limits  BLOOD GAS, VENOUS - Abnormal; Notable for the following components:   pCO2, Ven 40 (*)    Bicarbonate 17.5 (*)    Acid-base deficit 9.2 (*)    All other components within normal limits  BETA-HYDROXYBUTYRIC ACID - Abnormal; Notable for the following components:   Beta-Hydroxybutyric Acid 7.43 (*)    All other components within normal limits  URINALYSIS, ROUTINE W REFLEX MICROSCOPIC - Abnormal; Notable for the following components:   Color, Urine COLORLESS (*)    Glucose, UA >=500 (*)    Ketones, ur 80 (*)    All other components within normal limits  CBG MONITORING, ED - Abnormal; Notable for the following components:   Glucose-Capillary >600 (*)    All other components within normal limits  I-STAT CHEM 8, ED - Abnormal; Notable for the following components:   Sodium 125 (*)    Chloride 92 (*)    Glucose, Bld >700 (*)    TCO2 16 (*)    Hemoglobin 17.3 (*)    All other components within normal limits  CBC WITH DIFFERENTIAL/PLATELET  HEMOGLOBIN A1C  CBG MONITORING, ED    EKG EKG Interpretation Date/Time:  Thursday February 26 2023 12:38:04 EST Ventricular Rate:  65 PR Interval:  140 QRS Duration:  94 QT Interval:  407 QTC Calculation: 424 R Axis:   51  Text Interpretation: Sinus rhythm Left ventricular hypertrophy No significant change since last tracing Confirmed by Dean Clarity [phone]) on 02/26/2023 1:03:07 PM  Radiology DG Chest Portable 1 View Result Date: 02/26/2023 CLINICAL DATA:  Hyperglycemia. EXAM: PORTABLE CHEST 1 VIEW COMPARISON:  Chest x-ray dated January 23, 2016. FINDINGS: The heart  size and mediastinal contours are within normal limits. Both lungs are clear. The visualized skeletal structures are unremarkable. IMPRESSION: No active disease. Electronically Signed   By: Elsie ONEIDA Shoulder M.D.   On: 02/26/2023 12:35    Procedures Procedures    Medications Ordered in ED Medications  insulin  regular, human (MYXREDLIN ) 100 units/ 100 mL infusion (has no administration in time  range)  dextrose  5 % in lactated ringers  infusion (0 mLs Intravenous Hold 02/26/23 1247)  dextrose  50 % solution 0-50 mL (has no administration in time range)  0.9 %  sodium chloride  infusion (has no administration in time range)  sodium chloride  0.9 % bolus 1,000 mL (1,000 mLs Intravenous New Bag/Given 02/26/23 1146)  ondansetron  (ZOFRAN ) injection 4 mg (4 mg Intravenous Given 02/26/23 1145)  insulin  aspart (novoLOG ) injection 8 Units (8 Units Intravenous Given 02/26/23 1149)  sodium chloride  0.9 % bolus 1,000 mL (1,000 mLs Intravenous New Bag/Given 02/26/23 1256)    ED Course/ Medical Decision Making/ A&P                                 Medical Decision Making Amount and/or Complexity of Data Reviewed Labs: ordered. Radiology: ordered.  Risk Prescription drug management. Decision regarding hospitalization.   This patient presents to the ED for concern of hyperglycemia, this involves an extensive number of treatment options, and is a complaint that carries with it a high risk of complications and morbidity.  The differential diagnosis includes dka, hhs, hyperglycemia   Co morbidities that complicate the patient evaluation  Dm1 and asthma   Additional history obtained:  Additional history obtained from epic chart review External records from outside source obtained and reviewed including EMS report   Lab Tests:  I Ordered, and personally interpreted labs.  The pertinent results include:  cbc nl, ua neg for uti, but + ketones; BHB + at 7.43, cmp with glucose elevated at 785, na low at 125,  but due to hyperglycemia; vbg with ph 7.25   Imaging Studies ordered:  I ordered imaging studies including cxr  I independently visualized and interpreted imaging which showed No active disease.  I agree with the radiologist interpretation   Cardiac Monitoring:  The patient was maintained on a cardiac monitor.  I personally viewed and interpreted the cardiac monitored which showed an underlying rhythm of: nsr   Medicines ordered and prescription drug management:  I ordered medication including ivfs/iv insulin   for dka  Reevaluation of the patient after these medicines showed that the patient improved I have reviewed the patients home medicines and have made adjustments as needed   Critical Interventions:  Insulin /ivfs   Consultations Obtained:  I requested consultation with the hospitalist (Dr. Davia),  and discussed lab and imaging findings as well as pertinent plan - she will admit   Problem List / ED Course:  DKA:  iv insulin  and ivfs ordered.  Pt will need admission.  He will also need sw to see if they can help get his insulin  at d/c.   Reevaluation:  After the interventions noted above, I reevaluated the patient and found that they have :improved   Social Determinants of Health:  Lives at home   Dispostion:  After consideration of the diagnostic results and the patients response to treatment, I feel that the patent would benefit from admission.  CRITICAL CARE Performed by: Mliss Boyers   Total critical care time: 30 minutes  Critical care time was exclusive of separately billable procedures and treating other patients.  Critical care was necessary to treat or prevent imminent or life-threatening deterioration.  Critical care was time spent personally by me on the following activities: development of treatment plan with patient and/or surrogate as well as nursing, discussions with consultants, evaluation of patient's response to treatment, examination  of patient, obtaining history from  patient or surrogate, ordering and performing treatments and interventions, ordering and review of laboratory studies, ordering and review of radiographic studies, pulse oximetry and re-evaluation of patient's condition.           Final Clinical Impression(s) / ED Diagnoses Final diagnoses:  Diabetic ketoacidosis without coma associated with type 1 diabetes mellitus (HCC)    Rx / DC Orders ED Discharge Orders     None         Dean Clarity, MD 02/26/23 1307

## 2023-02-26 NOTE — ED Notes (Signed)
 Pt stated they would like to be disconnected bc they wanted to leave due to not eating "all day." Provider notified. Pt is disconnected at this time.

## 2023-02-26 NOTE — Care Management (Addendum)
 Per chart review patient is from [place] and currently living in car and is out of insulin. Patient is eligible for [facility] and now being admitted.   TOC will follow on the floor.

## 2023-02-27 ENCOUNTER — Other Ambulatory Visit (HOSPITAL_COMMUNITY): Payer: Self-pay

## 2023-02-27 DIAGNOSIS — E1010 Type 1 diabetes mellitus with ketoacidosis without coma: Secondary | ICD-10-CM | POA: Diagnosis not present

## 2023-02-27 LAB — BASIC METABOLIC PANEL
Anion gap: 8 (ref 5–15)
BUN: 15 mg/dL (ref 6–20)
CO2: 27 mmol/L (ref 22–32)
Calcium: 8.8 mg/dL — ABNORMAL LOW (ref 8.9–10.3)
Chloride: 98 mmol/L (ref 98–111)
Creatinine, Ser: 0.81 mg/dL (ref 0.61–1.24)
GFR, Estimated: >60 mL/min (ref >60)
Glucose, Bld: 143 mg/dL — ABNORMAL HIGH (ref 70–99)
Potassium: 3.2 mmol/L — ABNORMAL LOW (ref 3.5–5.1)
Sodium: 133 mmol/L — ABNORMAL LOW (ref 135–145)

## 2023-02-27 LAB — GLUCOSE, CAPILLARY
Glucose-Capillary: 139 mg/dL — ABNORMAL HIGH (ref 70–99)
Glucose-Capillary: 152 mg/dL — ABNORMAL HIGH (ref 70–99)
Glucose-Capillary: 161 mg/dL — ABNORMAL HIGH (ref 70–99)
Glucose-Capillary: 167 mg/dL — ABNORMAL HIGH (ref 70–99)
Glucose-Capillary: 169 mg/dL — ABNORMAL HIGH (ref 70–99)
Glucose-Capillary: 176 mg/dL — ABNORMAL HIGH (ref 70–99)
Glucose-Capillary: 220 mg/dL — ABNORMAL HIGH (ref 70–99)
Glucose-Capillary: 234 mg/dL — ABNORMAL HIGH (ref 70–99)
Glucose-Capillary: 234 mg/dL — ABNORMAL HIGH (ref 70–99)
Glucose-Capillary: 84 mg/dL (ref 70–99)

## 2023-02-27 LAB — HEMOGLOBIN A1C
Hgb A1c MFr Bld: >15.5 % — ABNORMAL HIGH (ref 4.8–5.6)
Mean Plasma Glucose: >398 mg/dL

## 2023-02-27 LAB — HIV ANTIBODY (ROUTINE TESTING W REFLEX): HIV Screen 4th Generation wRfx: NONREACTIVE

## 2023-02-27 LAB — BETA-HYDROXYBUTYRIC ACID: Beta-Hydroxybutyric Acid: 0.21 mmol/L (ref 0.05–0.27)

## 2023-02-27 MED ORDER — INSULIN GLARGINE-YFGN 100 UNIT/ML ~~LOC~~ SOLN
21.0000 [IU] | Freq: Once | SUBCUTANEOUS | Status: AC
  Administered 2023-02-27: 21 [IU] via SUBCUTANEOUS
  Filled 2023-02-27 (×2): qty 0.21

## 2023-02-27 MED ORDER — LANCETS MISC
1.0000 | Freq: Three times a day (TID) | 0 refills | Status: AC
  Filled 2023-02-27: qty 100, 34d supply, fill #0

## 2023-02-27 MED ORDER — INSULIN ASPART 100 UNIT/ML IJ SOLN: 0.0000 [IU] | Freq: Every day | INTRAMUSCULAR | Status: DC

## 2023-02-27 MED ORDER — PEN NEEDLES 31G X 5 MM MISC
1.0000 | Freq: Three times a day (TID) | 0 refills | Status: AC
  Filled 2023-02-27: qty 100, 34d supply, fill #0

## 2023-02-27 MED ORDER — INSULIN ASPART 100 UNIT/ML FLEXPEN
0.0000 [IU] | PEN_INJECTOR | Freq: Three times a day (TID) | SUBCUTANEOUS | 0 refills | Status: AC
  Filled 2023-02-27: qty 9, 27d supply, fill #0

## 2023-02-27 MED ORDER — LANCET DEVICE MISC
1.0000 | Freq: Three times a day (TID) | 0 refills | Status: AC
  Filled 2023-02-27: qty 1, fill #0

## 2023-02-27 MED ORDER — INSULIN ASPART 100 UNIT/ML IJ SOLN
0.0000 [IU] | Freq: Three times a day (TID) | INTRAMUSCULAR | Status: DC
  Administered 2023-02-27 (×2): 5 [IU] via SUBCUTANEOUS

## 2023-02-27 MED ORDER — INSULIN GLARGINE 100 UNIT/ML SOLOSTAR PEN
32.0000 [IU] | PEN_INJECTOR | Freq: Two times a day (BID) | SUBCUTANEOUS | 1 refills | Status: DC
  Filled 2023-02-27: qty 15, 23d supply, fill #0

## 2023-02-27 MED ORDER — POTASSIUM CHLORIDE CRYS ER 20 MEQ PO TBCR
40.0000 meq | EXTENDED_RELEASE_TABLET | Freq: Once | ORAL | Status: AC
  Administered 2023-02-27: 40 meq via ORAL
  Filled 2023-02-27: qty 2

## 2023-02-27 MED ORDER — BLOOD GLUCOSE TEST VI STRP
1.0000 | ORAL_STRIP | Freq: Three times a day (TID) | 0 refills | Status: AC
  Filled 2023-02-27: qty 100, 30d supply, fill #0

## 2023-02-27 MED ORDER — ACCU-CHEK GUIDE W/DEVICE KIT
1.0000 | PACK | Freq: Three times a day (TID) | 0 refills | Status: AC
  Filled 2023-02-27: qty 1, 30d supply, fill #0

## 2023-02-27 NOTE — Discharge Summary (Signed)
 Physician Discharge Summary  Henry Hardin FMW:[id number] DOB: [birth year] DOA: 02/26/2023  PCP: Woodard Lima, FNP  Admit date: 02/26/2023 Discharge date: 02/27/2023  Recommendations for Outpatient Follow-up:  Follow up with PCP in 1 week with repeat CBC/BMP Comply with medications and follow-up Follow up in ED if symptoms worsen or new appear   Home Health: No Equipment/Devices: None  Discharge Condition: Stable CODE STATUS: Full Diet recommendation: Carb modified  Brief/Interim Summary: 38 year old male with history of diabetes mellitus type 1, asthma, recent homelessness who ran out of his insulin  for a month presented with hypoglycemia and not feeling well.  On presentation, he was found to be in DKA and treated with IV fluids and insulin  drip.  He was subsequently transitioned to long-acting insulin  once anion gap closed.  He feels much better and is tolerating diet.  Diabetes coordinator has already evaluated the patient and given discharge insulin  recommendations.  He wants to be discharged today.  Discharge patient today on long-acting insulin  along with short acting insulin  with meals.  Outpatient follow-up with PCP along with compliance with medications and follow-up will be very important.  Discharge Diagnoses:   DKA in the setting of uncontrolled diabetes mellitus type 1 with hyperglycemia who ran out of insulin  recently -  On presentation, he was found to be in DKA and treated with IV fluids and insulin  drip.  He was subsequently transitioned to long-acting insulin  once anion gap closed.  He feels much better and is tolerating diet.  Diabetes coordinator has already evaluated the patient and given discharge insulin  recommendations.  He wants to be discharged today.  Discharge patient today on long-acting insulin  along with short acting insulin  with meals.  Outpatient follow-up with PCP along with compliance with medications and follow-up will be very  important.  Hyponatremia/pseudohyponatremia -Sodium improving.  Outpatient follow-up.  Encourage oral intake  Hypokalemia -Treated with oral replacement.  Outpatient follow-up.  Acute kidney injury Dehydration -Resolved with IV fluids.  Asthma -Stable.  Currently not on any bronchodilators.  Outpatient follow-up with PCP  Dental caries -Will need outpatient evaluation by dentist  Homelessness -TOC has been consulted  Discharge Instructions  Discharge Instructions     Diet Carb Modified   Complete by: As directed    Increase activity slowly   Complete by: As directed       Allergies as of 02/27/2023       Reactions   Bee Venom Anaphylaxis, Itching, Swelling   Magnesium -containing Compounds Rash        Medication List     TAKE these medications    Blood Glucose Monitoring Suppl Devi 1 each by Does not apply route 3 (three) times daily. May dispense any manufacturer covered by patient's insurance.   BLOOD GLUCOSE TEST STRIPS Strp 1 each by Does not apply route 3 (three) times daily. Use as directed to check blood sugar. May dispense any manufacturer covered by patient's insurance and fits patient's device.   insulin  aspart 100 UNIT/ML FlexPen Commonly known as: NOVOLOG  Inject 0-11 Units into the skin 3 (three) times daily with meals. Check Blood Glucose (BG) and inject per scale: BG <150= 0 unit; BG 150-200= 1 unit; BG 201-250= 3 unit; BG 251-300= 5 unit; BG 301-350= 7 unit; BG 351-400= 9 unit; BG >400= 11 unit and Call Primary Care.   insulin  glargine 100 UNIT/ML Solostar Pen Commonly known as: LANTUS  Inject 32 Units into the skin 2 (two) times daily. May substitute as needed per insurance.   Lancet Device  Misc 1 each by Does not apply route 3 (three) times daily. May dispense any manufacturer covered by patient's insurance.   Lancets Misc 1 each by Does not apply route 3 (three) times daily. Use as directed to check blood sugar. May dispense any  manufacturer covered by patient's insurance and fits patient's device.   Pen Needles 31G X 5 MM Misc 1 each by Does not apply route 3 (three) times daily. May dispense any manufacturer covered by patient's insurance.        Follow-up Information     Woodard Lima, FNP. Schedule an appointment as soon as possible for a visit in 1 week(s).   Specialty: Pediatrics Contact information: [address], [address] [place] [address] [phone]                Allergies  Allergen Reactions   Bee Venom Anaphylaxis, Itching and Swelling   Magnesium -Containing Compounds Rash    Consultations: None   Procedures/Studies: DG Chest Portable 1 View Result Date: 02/26/2023 CLINICAL DATA:  Hyperglycemia. EXAM: PORTABLE CHEST 1 VIEW COMPARISON:  Chest x-ray dated January 23, 2016. FINDINGS: The heart size and mediastinal contours are within normal limits. Both lungs are clear. The visualized skeletal structures are unremarkable. IMPRESSION: No active disease. Electronically Signed   By: Elsie ONEIDA Shoulder M.D.   On: 02/26/2023 12:35      Subjective: Patient seen and examined at bedside.  Feels much better and feels okay to be discharged today.  Tolerating diet.  No fever reported.  Discharge Exam: Vitals:   02/27/23 0400 02/27/23 0800  BP: 114/78 120/70  Pulse: 60 60  Resp: 15 17  Temp: 98.2 F (36.8 C)   SpO2: 95% 95%    General: Pt is alert, awake, not in acute distress Cardiovascular: rate controlled, S1/S2 + Respiratory: bilateral decreased breath sounds at bases Abdominal: Soft, NT, ND, bowel sounds + Extremities: no edema, no cyanosis    The results of significant diagnostics from this hospitalization (including imaging, microbiology, ancillary and laboratory) are listed below for reference.     Microbiology: Recent Results (from the past 240 hours)  MRSA Next Gen by PCR, Nasal     Status: None   Collection Time: 02/26/23  8:14 PM   Specimen: Nasal  Mucosa; Nasal Swab  Result Value Ref Range Status   MRSA by PCR Next Gen NOT DETECTED NOT DETECTED Final    Comment: (NOTE) The GeneXpert MRSA Assay (FDA approved for NASAL specimens only), is one component of a comprehensive MRSA colonization surveillance program. It is not intended to diagnose MRSA infection nor to guide or monitor treatment for MRSA infections. Test performance is not FDA approved in patients less than 27 years old. Performed at [facility], [address], [place], [place] [address]      Labs: BNP (last 3 results) No results for input(s): BNP in the last 8760 hours. Basic Metabolic Panel: Recent Labs  Lab 02/26/23 1140 02/26/23 1148 02/26/23 2150 02/27/23 0319  NA 125* 125* 134* 133*  K 5.1 5.1 3.9 3.2*  CL 89* 92* 99 98  CO2 16*  --  24 27  GLUCOSE 785* >700* 110* 143*  BUN 19 19 16 15   CREATININE 1.35* 1.00 0.93 0.81  CALCIUM 9.5  --  9.0 8.8*   Liver Function Tests: Recent Labs  Lab 02/26/23 1140  AST 21  ALT 36  ALKPHOS 150*  BILITOT 1.7*  PROT 8.6*  ALBUMIN 4.4   No results for  input(s): LIPASE, AMYLASE in the last 168 hours. No results for input(s): AMMONIA in the last 168 hours. CBC: Recent Labs  Lab 02/26/23 1140 02/26/23 1148  WBC 7.1  --   NEUTROABS 5.7  --   HGB 16.1 17.3*  HCT 46.3 51.0  MCV 87.7  --   PLT 294  --    Cardiac Enzymes: No results for input(s): CKTOTAL, CKMB, CKMBINDEX, TROPONINI in the last 168 hours. BNP: Invalid input(s): POCBNP CBG: Recent Labs  Lab 02/27/23 0407 02/27/23 0511 02/27/23 0601 02/27/23 0720 02/27/23 0850  GLUCAP 169* 139* 220* 84 234*   D-Dimer No results for input(s): DDIMER in the last 72 hours. Hgb A1c No results for input(s): HGBA1C in the last 72 hours. Lipid Profile No results for input(s): CHOL, HDL, LDLCALC, TRIG, CHOLHDL, LDLDIRECT in the last 72 hours. Thyroid function studies No results for input(s): TSH,  T4TOTAL, T3FREE, THYROIDAB in the last 72 hours.  Invalid input(s): FREET3 Anemia work up No results for input(s): VITAMINB12, FOLATE, FERRITIN, TIBC, IRON, RETICCTPCT in the last 72 hours. Urinalysis    Component Value Date/Time   COLORURINE COLORLESS (A) 02/26/2023 1102   APPEARANCEUR CLEAR 02/26/2023 1102   LABSPEC 1.025 02/26/2023 1102   PHURINE 5.0 02/26/2023 1102   GLUCOSEU >=500 (A) 02/26/2023 1102   HGBUR NEGATIVE 02/26/2023 1102   BILIRUBINUR NEGATIVE 02/26/2023 1102   KETONESUR 80 (A) 02/26/2023 1102   PROTEINUR NEGATIVE 02/26/2023 1102   UROBILINOGEN 0.2 07/24/2009 2105   NITRITE NEGATIVE 02/26/2023 1102   LEUKOCYTESUR NEGATIVE 02/26/2023 1102   Sepsis Labs Recent Labs  Lab 02/26/23 1140  WBC 7.1   Microbiology Recent Results (from the past 240 hours)  MRSA Next Gen by PCR, Nasal     Status: None   Collection Time: 02/26/23  8:14 PM   Specimen: Nasal Mucosa; Nasal Swab  Result Value Ref Range Status   MRSA by PCR Next Gen NOT DETECTED NOT DETECTED Final    Comment: (NOTE) The GeneXpert MRSA Assay (FDA approved for NASAL specimens only), is one component of a comprehensive MRSA colonization surveillance program. It is not intended to diagnose MRSA infection nor to guide or monitor treatment for MRSA infections. Test performance is not FDA approved in patients less than 36 years old. Performed at [facility], [address], [place], [place] [address]      Time coordinating discharge: 35 minutes  SIGNED:   Sophie Mao, MD  [facility] 02/27/2023, 9:31 AM

## 2023-02-27 NOTE — Inpatient Diabetes Management (Signed)
 Inpatient Diabetes Program Recommendations  AACE/ADA: New Consensus Statement on Inpatient Glycemic Control (2015)  Target Ranges:  Prepandial:   less than 140 mg/dL      Peak postprandial:   less than 180 mg/dL (1-2 hours)      Critically ill patients:  140 - 180 mg/dL   Lab Results  Component Value Date   GLUCAP 234 (H) 02/27/2023    Review of Glycemic Control  Diabetes history: LADA Outpatient Diabetes medications: Lantus  32 units BID, Novolog  Sliding Scale  Inpatient Diabetes Program Recommendations:    Discharge Recommendations: Long acting recommendations: Insulin  Glargine (LANTUS ) Solostar Pen 32 units BID  Short acting recommendations:  Correction coverage ONLY Insulin  aspart (NOVOLOG ) FlexPen  Moderate Scale.     Use Adult Diabetes Insulin  Treatment Post Discharge order set.  Met with patient again this morning.  Provided him with a ReliOn Glucometer.  TOC will provide a MATCH and meds to bed this morning.  Encouraged him to establish with one of our clinics; he should be able to get medication assistance with the clinic.  He states he will follow up with the clinic and he is also trying to esbalish with the [place].    Will continue to follow while inpatient.  Thank you, Wyvonna Pinal, MSN, CDCES Diabetes Coordinator Inpatient Diabetes Program [phone] (team pager from 8a-5p)

## 2023-02-27 NOTE — Plan of Care (Signed)
 Problem: Education: Goal: Ability to describe self-care measures that may prevent or decrease complications (Diabetes Survival Skills Education) will improve Outcome: Adequate for Discharge Goal: Individualized Educational Video(s) Outcome: Adequate for Discharge   Problem: Coping: Goal: Ability to adjust to condition or change in health will improve Outcome: Adequate for Discharge   Problem: Fluid Volume: Goal: Ability to maintain a balanced intake and output will improve Outcome: Adequate for Discharge   Problem: Health Behavior/Discharge Planning: Goal: Ability to identify and utilize available resources and services will improve Outcome: Adequate for Discharge Goal: Ability to manage health-related needs will improve Outcome: Adequate for Discharge   Problem: Metabolic: Goal: Ability to maintain appropriate glucose levels will improve Outcome: Adequate for Discharge   Problem: Nutritional: Goal: Maintenance of adequate nutrition will improve Outcome: Adequate for Discharge Goal: Progress toward achieving an optimal weight will improve Outcome: Adequate for Discharge   Problem: Skin Integrity: Goal: Risk for impaired skin integrity will decrease Outcome: Adequate for Discharge   Problem: Tissue Perfusion: Goal: Adequacy of tissue perfusion will improve Outcome: Adequate for Discharge   Problem: Education: Goal: Ability to describe self-care measures that may prevent or decrease complications (Diabetes Survival Skills Education) will improve Outcome: Adequate for Discharge Goal: Individualized Educational Video(s) Outcome: Adequate for Discharge   Problem: Cardiac: Goal: Ability to maintain an adequate cardiac output will improve Outcome: Adequate for Discharge   Problem: Health Behavior/Discharge Planning: Goal: Ability to identify and utilize available resources and services will improve Outcome: Adequate for Discharge Goal: Ability to manage health-related  needs will improve Outcome: Adequate for Discharge   Problem: Fluid Volume: Goal: Ability to achieve a balanced intake and output will improve Outcome: Adequate for Discharge   Problem: Metabolic: Goal: Ability to maintain appropriate glucose levels will improve Outcome: Adequate for Discharge   Problem: Nutritional: Goal: Maintenance of adequate nutrition will improve Outcome: Adequate for Discharge Goal: Maintenance of adequate weight for body size and type will improve Outcome: Adequate for Discharge   Problem: Respiratory: Goal: Will regain and/or maintain adequate ventilation Outcome: Adequate for Discharge   Problem: Urinary Elimination: Goal: Ability to achieve and maintain adequate renal perfusion and functioning will improve Outcome: Adequate for Discharge   Problem: Education: Goal: Knowledge of General Education information will improve Description: Including pain rating scale, medication(s)/side effects and non-pharmacologic comfort measures Outcome: Adequate for Discharge   Problem: Health Behavior/Discharge Planning: Goal: Ability to manage health-related needs will improve Outcome: Adequate for Discharge   Problem: Clinical Measurements: Goal: Ability to maintain clinical measurements within normal limits will improve Outcome: Adequate for Discharge Goal: Will remain free from infection Outcome: Adequate for Discharge Goal: Diagnostic test results will improve Outcome: Adequate for Discharge Goal: Respiratory complications will improve Outcome: Adequate for Discharge Goal: Cardiovascular complication will be avoided Outcome: Adequate for Discharge   Problem: Activity: Goal: Risk for activity intolerance will decrease Outcome: Adequate for Discharge   Problem: Nutrition: Goal: Adequate nutrition will be maintained Outcome: Adequate for Discharge   Problem: Coping: Goal: Level of anxiety will decrease Outcome: Adequate for Discharge   Problem:  Elimination: Goal: Will not experience complications related to bowel motility Outcome: Adequate for Discharge Goal: Will not experience complications related to urinary retention Outcome: Adequate for Discharge   Problem: Pain Management: Goal: General experience of comfort will improve Outcome: Adequate for Discharge   Problem: Safety: Goal: Ability to remain free from injury will improve Outcome: Adequate for Discharge   Problem: Skin Integrity: Goal: Risk for impaired skin integrity will decrease  Outcome: Adequate for Discharge

## 2023-02-27 NOTE — TOC Transition Note (Addendum)
 Transition of Care [facility]) - Discharge Note   Patient Details  Name: [name] MRN: [id number] Date of Birth: Apr 13, 1985  [facility]) CM/SW Contact:  Bascom Service, RN Phone Number: 02/27/2023, 11:50 AM   Clinical Narrative:  Provided w/pharmforce $0 co pay;[facility] for PCP(patient will call on own) No further CM needs.   MATCH MEDICATION ASSISTANCE CARD Pharmacies please call: [phone] for claim processing assistance.  Rx BIN: [id number] Rx Group: [id number] Rx PCN: [id number] Relationship Code: 1 Person Code: [id number]  Patient ID (MRN): [facility] [id number]    Patient Name: [name], [name]    Patient DOB: 10/22/1985    Discharge Date: 02/27/2023    Expiration Date:03/06/2023 (must be filled within 7 days of discharge)     Final next level of care: Home/Self Care Barriers to Discharge: No Barriers Identified   Patient Goals and CMS Choice            Discharge Placement                       Discharge Plan and Services Additional resources added to the After Visit Summary for                                       Social Drivers of Health (SDOH) Interventions SDOH Screenings   Food Insecurity: Food Insecurity Present (02/26/2023)  Housing: High Risk (02/26/2023)  Transportation Needs: No Transportation Needs (02/26/2023)  Utilities: At Risk (02/26/2023)  Social Connections: Unknown (07/01/2021)   Received from [facility]  Tobacco Use: Medium Risk (02/26/2023)     Readmission Risk Interventions     No data to display

## 2023-02-27 NOTE — Plan of Care (Signed)

## 2023-04-01 ENCOUNTER — Emergency Department (HOSPITAL_BASED_OUTPATIENT_CLINIC_OR_DEPARTMENT_OTHER)
Admission: EM | Admit: 2023-04-01 | Discharge: 2023-04-01 | Disposition: A | Discharge status: Home / Self Care | Payer: Medicaid Other | Attending: Emergency Medicine | Admitting: Emergency Medicine

## 2023-04-01 ENCOUNTER — Other Ambulatory Visit: Payer: Self-pay

## 2023-04-01 ENCOUNTER — Emergency Department (HOSPITAL_BASED_OUTPATIENT_CLINIC_OR_DEPARTMENT_OTHER): Payer: Medicaid Other | Admitting: Radiology

## 2023-04-01 ENCOUNTER — Encounter (HOSPITAL_BASED_OUTPATIENT_CLINIC_OR_DEPARTMENT_OTHER): Payer: Self-pay | Admitting: Emergency Medicine

## 2023-04-01 DIAGNOSIS — R072 Precordial pain: Secondary | ICD-10-CM | POA: Diagnosis present

## 2023-04-01 DIAGNOSIS — R197 Diarrhea, unspecified: Secondary | ICD-10-CM | POA: Diagnosis not present

## 2023-04-01 DIAGNOSIS — E119 Type 2 diabetes mellitus without complications: Secondary | ICD-10-CM | POA: Insufficient documentation

## 2023-04-01 DIAGNOSIS — J4520 Mild intermittent asthma, uncomplicated: Secondary | ICD-10-CM | POA: Diagnosis not present

## 2023-04-01 DIAGNOSIS — Z794 Long term (current) use of insulin: Secondary | ICD-10-CM | POA: Insufficient documentation

## 2023-04-01 DIAGNOSIS — M940 Chondrocostal junction syndrome [Tietze]: Secondary | ICD-10-CM | POA: Diagnosis not present

## 2023-04-01 DIAGNOSIS — R0789 Other chest pain: Secondary | ICD-10-CM

## 2023-04-01 DIAGNOSIS — R531 Weakness: Secondary | ICD-10-CM | POA: Insufficient documentation

## 2023-04-01 LAB — BASIC METABOLIC PANEL
Anion gap: 8 (ref 5–15)
BUN: 10 mg/dL (ref 6–20)
CO2: 28 mmol/L (ref 22–32)
Calcium: 9.4 mg/dL (ref 8.9–10.3)
Chloride: 99 mmol/L (ref 98–111)
Creatinine, Ser: 1.15 mg/dL (ref 0.61–1.24)
GFR, Estimated: >60 mL/min (ref >60)
Glucose, Bld: 241 mg/dL — ABNORMAL HIGH (ref 70–99)
Potassium: 3.9 mmol/L (ref 3.5–5.1)
Sodium: 135 mmol/L (ref 135–145)

## 2023-04-01 LAB — CBC
HCT: 46.5 % (ref 39.0–52.0)
Hemoglobin: 15.7 g/dL (ref 13.0–17.0)
MCH: 30.3 pg (ref 26.0–34.0)
MCHC: 33.8 g/dL (ref 30.0–36.0)
MCV: 89.8 fL (ref 80.0–100.0)
Platelets: 318 10*3/uL (ref 150–400)
RBC: 5.18 MIL/uL (ref 4.22–5.81)
RDW: 13.4 % (ref 11.5–15.5)
WBC: 3.9 10*3/uL — ABNORMAL LOW (ref 4.0–10.5)
nRBC: 0 % (ref 0.0–0.2)

## 2023-04-01 LAB — RESP PANEL BY RT-PCR (RSV, FLU A&B, COVID)  RVPGX2
Influenza A by PCR: NEGATIVE
Influenza B by PCR: NEGATIVE
Resp Syncytial Virus by PCR: NEGATIVE
SARS Coronavirus 2 by RT PCR: NEGATIVE

## 2023-04-01 LAB — TROPONIN I (HIGH SENSITIVITY): Troponin I (High Sensitivity): 3 ng/L (ref <18)

## 2023-04-01 LAB — CBG MONITORING, ED: Glucose-Capillary: 236 mg/dL — ABNORMAL HIGH (ref 70–99)

## 2023-04-01 MED ORDER — IPRATROPIUM-ALBUTEROL 0.5-2.5 (3) MG/3ML IN SOLN
3.0000 mL | Freq: Once | RESPIRATORY_TRACT | Status: AC
  Administered 2023-04-01: 3 mL via RESPIRATORY_TRACT
  Filled 2023-04-01: qty 3

## 2023-04-01 MED ORDER — INSULIN GLARGINE 100 UNIT/ML SOLOSTAR PEN: 32.0000 [IU] | PEN_INJECTOR | Freq: Two times a day (BID) | SUBCUTANEOUS | 1 refills | Status: DC

## 2023-04-01 MED ORDER — ALBUTEROL SULFATE HFA 108 (90 BASE) MCG/ACT IN AERS: 1.0000 | INHALATION_SPRAY | Freq: Four times a day (QID) | RESPIRATORY_TRACT | 0 refills | Status: DC | PRN

## 2023-04-01 MED ORDER — ALBUTEROL SULFATE HFA 108 (90 BASE) MCG/ACT IN AERS: 1.0000 | INHALATION_SPRAY | Freq: Four times a day (QID) | RESPIRATORY_TRACT | 0 refills | Status: AC | PRN

## 2023-04-01 MED ORDER — ACETAMINOPHEN 500 MG PO TABS
1000.0000 mg | ORAL_TABLET | Freq: Once | ORAL | Status: AC
  Administered 2023-04-01: 1000 mg via ORAL
  Filled 2023-04-01: qty 2

## 2023-04-01 MED ORDER — ALBUTEROL SULFATE HFA 108 (90 BASE) MCG/ACT IN AERS
2.0000 | INHALATION_SPRAY | RESPIRATORY_TRACT | Status: DC | PRN
  Administered 2023-04-01: 2 via RESPIRATORY_TRACT
  Filled 2023-04-01: qty 6.7

## 2023-04-01 NOTE — ED Notes (Signed)
Discharge instructions, follow up care, and prescriptions reviewed and explained, pt verbalized understanding and had no further questions on d/c. Pt caox4, ambulatory, NAD on d/c.

## 2023-04-01 NOTE — ED Provider Notes (Signed)
[facility] EMERGENCY DEPARTMENT AT [facility] Provider Note   CSN: [id number] Arrival date & time: 04/01/23  1127     History  Chief Complaint  Patient presents with   Chest Pain    Henry Hardin is a 38 y.o. male with PMHx asthma, DM. GERD, who presents  to ED concerned for constant sternal chest pain and SOB x2 days. Also with cough x3-4 days. Also endorsing some diarrhea and generalized weakness. Patient stating that it feels similar to last time that he had PNA. Patient also stating that he is out of his insulin and albuterol rescue inhaler.  Patient was discharged from [facility] 02/27/2023 for DKA.  Denies recent hx DVT/PE, hemoptysis, hx cancer in the past 6 months, calf swelling/tenderness.    Chest Pain      Home Medications Prior to Admission medications   Medication Sig Start Date End Date Taking? Authorizing Provider  albuterol (VENTOLIN HFA) 108 (90 Base) MCG/ACT inhaler Inhale 1-2 puffs into the lungs every 6 (six) hours as needed for wheezing or shortness of breath. 04/01/23   Dorthy Cooler, PA-C  Blood Glucose Monitoring Suppl (ACCU-CHEK GUIDE) w/Device KIT Use as directed 3 (three) times daily. 02/27/23   Glade Lloyd, MD  Glucose Blood (BLOOD GLUCOSE TEST STRIPS) STRP Use as directed to check blood sugar 3 (three) times daily. 02/27/23   Glade Lloyd, MD  insulin aspart (NOVOLOG) 100 UNIT/ML FlexPen Inject 0-11 Units into the skin 3 (three) times daily with meals. Check Blood Glucose (BG) and inject per scale: BG <150= 0 unit; BG 150-200= 1 unit; BG 201-250= 3 unit; BG 251-300= 5 unit; BG 301-350= 7 unit; BG 351-400= 9 unit; BG >400= 11 unit and Call Primary Care. 02/27/23   Glade Lloyd, MD  insulin glargine (LANTUS) 100 UNIT/ML Solostar Pen Inject 32 Units into the skin 2 (two) times daily. 04/01/23   Dorthy Cooler, PA-C  Insulin Pen Needle (PEN NEEDLES) 31G X 5 MM MISC Use as directed 3 (three) times daily. 02/27/23   Glade Lloyd, MD  Lancet  Device MISC Use 3 (three) times daily. 02/27/23   Glade Lloyd, MD  Lancets MISC Use as directed to check blood sugar 3 (three) times daily. . 02/27/23   Glade Lloyd, MD      Allergies    Bee venom and Magnesium-containing compounds    Review of Systems   Review of Systems  Cardiovascular:  Positive for chest pain.    Physical Exam Updated Vital Signs BP (!) 144/95 (BP Location: Right Arm)   Pulse 90   Temp 98.7 F (37.1 C) (Oral)   Resp 18   Ht 6\' 2"  (1.88 m)   Wt 84.4 kg   SpO2 99%   BMI 23.88 kg/m  Physical Exam Vitals and nursing note reviewed.  Constitutional:      General: He is not in acute distress.    Appearance: He is not ill-appearing or toxic-appearing.  HENT:     Head: Normocephalic and atraumatic.     Mouth/Throat:     Mouth: Mucous membranes are moist.     Pharynx: No oropharyngeal exudate or posterior oropharyngeal erythema.  Eyes:     General: No scleral icterus.       Right eye: No discharge.        Left eye: No discharge.     Conjunctiva/sclera: Conjunctivae normal.  Cardiovascular:     Rate and Rhythm: Normal rate and regular rhythm.     Pulses: Normal pulses.  Heart sounds: Normal heart sounds. No murmur heard. Pulmonary:     Effort: Pulmonary effort is normal. No respiratory distress.     Breath sounds: Wheezing present. No rhonchi or rales.     Comments: Very mild diffuse expiratory wheezing Abdominal:     Tenderness: There is no abdominal tenderness.  Musculoskeletal:     Right lower leg: No edema.     Left lower leg: No edema.     Comments: Tenderness to palpation of sternum. No calf tenderness to palpation.  Skin:    General: Skin is warm and dry.     Findings: No rash.  Neurological:     General: No focal deficit present.     Mental Status: He is alert and oriented to person, place, and time. Mental status is at baseline.  Psychiatric:        Mood and Affect: Mood normal.        Behavior: Behavior normal.     ED Results  / Procedures / Treatments   Labs (all labs ordered are listed, but only abnormal results are displayed) Labs Reviewed  BASIC METABOLIC PANEL - Abnormal; Notable for the following components:      Result Value   Glucose, Bld 241 (*)    All other components within normal limits  CBC - Abnormal; Notable for the following components:   WBC 3.9 (*)    All other components within normal limits  CBG MONITORING, ED - Abnormal; Notable for the following components:   Glucose-Capillary 236 (*)    All other components within normal limits  RESP PANEL BY RT-PCR (RSV, FLU A&B, COVID)  RVPGX2  TROPONIN I (HIGH SENSITIVITY)  TROPONIN I (HIGH SENSITIVITY)    EKG None  Radiology DG Chest 2 View Result Date: 04/01/2023 CLINICAL DATA:  Chest pain.  Shortness of breath. EXAM: CHEST - 2 VIEW COMPARISON:  02/26/2023. FINDINGS: Bilateral lung fields are clear. Bilateral costophrenic angles are clear. Normal cardio-mediastinal silhouette. No acute osseous abnormalities. The soft tissues are within normal limits. IMPRESSION: No active cardiopulmonary disease. Electronically Signed   By: Jules Schick M.D.   On: 04/01/2023 14:34    Procedures Procedures    Medications Ordered in ED Medications  albuterol (VENTOLIN HFA) 108 (90 Base) MCG/ACT inhaler 2 puff (2 puffs Inhalation Provided for home use 04/01/23 1602)  acetaminophen (TYLENOL) tablet 1,000 mg (1,000 mg Oral Given 04/01/23 1556)  ipratropium-albuterol (DUONEB) 0.5-2.5 (3) MG/3ML nebulizer solution 3 mL (3 mLs Nebulization Given 04/01/23 1556)    ED Course/ Medical Decision Making/ A&P                                 Medical Decision Making Amount and/or Complexity of Data Reviewed Labs: ordered. Radiology: ordered.  Risk OTC drugs. Prescription drug management.   This patient presents to the ED for concern of chest pain, this involves an extensive number of treatment options, and is a complaint that carries with it a high risk of  complications and morbidity.  The differential diagnosis includes acute coronary syndrome, congestive heart failure, pericarditis, pneumonia, pulmonary embolism, tension pneumothorax, esophageal rupture, aortic dissection, cardiac tamponade, musculoskeletal   Co morbidities that complicate the patient evaluation  asthma, DM. GERD   Additional history obtained:  Dr. Adin Hector PCP   Problem List / ED Course / Critical interventions / Medication management  Patient presented for chest pain and SOB x2 days. Cough x3-4 days. Patient is  out of his albuterol inhaler. Also requesting refill of his lantus.  Physical exam with tenderness to palpation of his sternum. There is also mild wheezing of diffuse BL lungs. Patient afebrile with stable vitals. Rest of physical exam reassuring.  I Ordered, and personally interpreted labs. CBC without leukocytosis or anemia. Resp panel negative. BMP with hyperglycemia at 241 but is otherwise reassuring. Patient's initial troponin within normal limits - patient stating that his chest pain has been constant for days so I did not think it was necessary to obtain a second troponin today. The patient was maintained on a cardiac monitor.  I personally viewed and interpreted the EKG/cardiac monitored which showed an underlying rhythm of: sinus rhythm. I ordered imaging studies including chest xray to assess for process contributing to patient's symptoms. I independently visualized and interpreted imaging which showed no acute cardiopulmonary disease. I agree with the radiologist interpretation. Shared results with patient and answered all questions. Overall, it appears that patient's chest pain is d/t costochondritis and SOB is d/t viral illness and being out of his rescue albuterol. I educated patient that we could proceed to assess for PE by ordering d-dimer - patient refused stating that he needs to leave to go to work now. Educated patient that incomplete medical workup  could lead to complications including disability and even death - patient verbalized understanding and does have capacity to make his own medical decisions, but declined further workup stating that he needs to leave for work now. Offered to provided patient with breathing treatment before discharge given his SOB and mild wheezing on exam. Patient was able to take some of the breathing treatment, but stated that he could no longer stay for the rest of the breathing treatment. Recommended following up with PCP. Patient verbalized understanding of plan. Patient was given albuterol inhaler by resp therapist at bedside.  I have sent the medications that patient requested to his pharmacy.  I have reviewed the patients home medicines and have made adjustments as needed Patient was given return precautions. Patient stable for discharge at this time.  Patient verbalized understanding of plan.  Social Determinants of Health:  none         Final Clinical Impression(s) / ED Diagnoses Final diagnoses:  Costochondritis  Mild intermittent asthma without complication  Atypical chest pain    Rx / DC Orders ED Discharge Orders          Ordered    albuterol (VENTOLIN HFA) 108 (90 Base) MCG/ACT inhaler  Every 6 hours PRN,   Status:  Discontinued        04/01/23 1544    insulin glargine (LANTUS) 100 UNIT/ML Solostar Pen  2 times daily,   Status:  Discontinued       Note to Pharmacy: May substitute as needed per insurance.   04/01/23 1544    insulin glargine (LANTUS) 100 UNIT/ML Solostar Pen  2 times daily       Note to Pharmacy: May substitute as needed per insurance.   04/01/23 1608    albuterol (VENTOLIN HFA) 108 (90 Base) MCG/ACT inhaler  Every 6 hours PRN        04/01/23 1608              Dorthy Cooler, [place] 04/01/23 1614    Anders Simmonds T, DO 04/01/23 2329

## 2023-04-01 NOTE — Discharge Instructions (Addendum)
It was a pleasure caring for you today. Please follow up with your primary care provider. Seek emergency care if experiencing any new or worsening symptoms.

## 2023-04-01 NOTE — ED Triage Notes (Signed)
C/o CP and SHOB x 2 days. Also endorses diarrhea and weakness.

## 2023-04-01 NOTE — ED Notes (Signed)
RT Note; Patient stated he was out of his inhaler from home and needed a refill

## 2023-07-05 ENCOUNTER — Emergency Department (HOSPITAL_COMMUNITY)

## 2023-07-05 ENCOUNTER — Encounter (HOSPITAL_COMMUNITY): Payer: Self-pay

## 2023-07-05 ENCOUNTER — Other Ambulatory Visit: Payer: Self-pay

## 2023-07-05 ENCOUNTER — Emergency Department (HOSPITAL_COMMUNITY)
Admission: EM | Admit: 2023-07-05 | Discharge: 2023-07-05 | Discharge status: Against Medical Advice | Attending: Emergency Medicine | Admitting: Emergency Medicine

## 2023-07-05 DIAGNOSIS — E119 Type 2 diabetes mellitus without complications: Secondary | ICD-10-CM | POA: Diagnosis not present

## 2023-07-05 DIAGNOSIS — Z794 Long term (current) use of insulin: Secondary | ICD-10-CM | POA: Insufficient documentation

## 2023-07-05 DIAGNOSIS — W19XXXA Unspecified fall, initial encounter: Secondary | ICD-10-CM | POA: Diagnosis not present

## 2023-07-05 DIAGNOSIS — Z5321 Procedure and treatment not carried out due to patient leaving prior to being seen by health care provider: Secondary | ICD-10-CM | POA: Diagnosis not present

## 2023-07-05 DIAGNOSIS — M25511 Pain in right shoulder: Secondary | ICD-10-CM | POA: Insufficient documentation

## 2023-07-05 DIAGNOSIS — M25561 Pain in right knee: Secondary | ICD-10-CM | POA: Insufficient documentation

## 2023-07-05 NOTE — ED Notes (Signed)
 Pt called x 3 with no response. Pt not visualized in the waiting area.

## 2023-07-05 NOTE — ED Triage Notes (Signed)
 Pt presents with R shoulder pain and R knee pain that has been ongoing since he fell 11 days ago. He reports he slipped in the woods and feel a few deet down a hill.   He is also requesting a refill for his insulin . He is DM2 and has been out of insulin  for 4 days. He checks his CBG daily and has maintained AM glucose of 110-130.

## 2023-07-25 ENCOUNTER — Encounter (HOSPITAL_COMMUNITY): Payer: Self-pay | Admitting: Emergency Medicine

## 2023-07-25 ENCOUNTER — Emergency Department (HOSPITAL_COMMUNITY)
Admission: EM | Admit: 2023-07-25 | Discharge: 2023-07-25 | Disposition: A | Discharge status: Home / Self Care | Attending: Emergency Medicine | Admitting: Emergency Medicine

## 2023-07-25 ENCOUNTER — Other Ambulatory Visit: Payer: Self-pay

## 2023-07-25 DIAGNOSIS — R0602 Shortness of breath: Secondary | ICD-10-CM | POA: Diagnosis present

## 2023-07-25 DIAGNOSIS — Z760 Encounter for issue of repeat prescription: Secondary | ICD-10-CM | POA: Diagnosis not present

## 2023-07-25 DIAGNOSIS — J45909 Unspecified asthma, uncomplicated: Secondary | ICD-10-CM | POA: Diagnosis not present

## 2023-07-25 MED ORDER — ALBUTEROL SULFATE HFA 108 (90 BASE) MCG/ACT IN AERS
2.0000 | INHALATION_SPRAY | Freq: Once | RESPIRATORY_TRACT | Status: AC
  Administered 2023-07-25: 2 via RESPIRATORY_TRACT
  Filled 2023-07-25: qty 6.7

## 2023-07-25 NOTE — ED Provider Notes (Signed)
 [place] EMERGENCY DEPARTMENT AT [facility] Provider Note   CSN: [id number] Arrival date & time: 07/25/23  0122     History  Chief Complaint  Patient presents with   Shortness of Breath    Henry Hardin is a 38 y.o. male.  38 yo M w/ h/o asthma here requesting inhaler. His ran out and he has felt increased cough and can't see PCP until Monday. No fever. States if he had his inhaler he wouldn't be here tonight.    Shortness of Breath      Home Medications Prior to Admission medications   Medication Sig Start Date End Date Taking? Authorizing Provider  albuterol  (VENTOLIN  HFA) 108 (90 Base) MCG/ACT inhaler Inhale 1-2 puffs into the lungs every 6 (six) hours as needed for wheezing or shortness of breath. 04/01/23   [place] Bureau, PA-C  Blood Glucose Monitoring Suppl (ACCU-CHEK GUIDE) w/Device KIT Use as directed 3 (three) times daily. 02/27/23   Audria Leather, MD  Glucose Blood (BLOOD GLUCOSE TEST STRIPS) STRP Use as directed to check blood sugar 3 (three) times daily. 02/27/23   Audria Leather, MD  insulin  aspart (NOVOLOG ) 100 UNIT/ML FlexPen Inject 0-11 Units into the skin 3 (three) times daily with meals. Check Blood Glucose (BG) and inject per scale: BG <150= 0 unit; BG 150-200= 1 unit; BG 201-250= 3 unit; BG 251-300= 5 unit; BG 301-350= 7 unit; BG 351-400= 9 unit; BG >400= 11 unit and Call Primary Care. 02/27/23   Audria Leather, MD  insulin  glargine (LANTUS ) 100 UNIT/ML Solostar Pen Inject 32 Units into the skin 2 (two) times daily. 04/01/23   [place] Bureau, PA-C  Insulin  Pen Needle (PEN NEEDLES) 31G X 5 MM MISC Use as directed 3 (three) times daily. 02/27/23   Audria Leather, MD  Lancet Device MISC Use 3 (three) times daily. 02/27/23   Audria Leather, MD  Lancets MISC Use as directed to check blood sugar 3 (three) times daily. . 02/27/23   Audria Leather, MD      Allergies    Bee venom and Magnesium -containing compounds    Review of Systems   Review  of Systems  Respiratory:  Positive for shortness of breath.     Physical Exam Updated Vital Signs BP (!) 138/92 (BP Location: Right Arm)   Pulse (!) 58   Temp 97.9 F (36.6 C) (Oral)   Resp 18   Ht 6\' 2"  (1.88 m)   Wt 86 kg   SpO2 96%   BMI 24.34 kg/m  Physical Exam Vitals and nursing note reviewed.  Constitutional:      Appearance: He is well-developed.  HENT:     Head: Normocephalic and atraumatic.  Cardiovascular:     Rate and Rhythm: Normal rate.  Pulmonary:     Effort: Pulmonary effort is normal. No respiratory distress.     Comments: Maybe slightly diminished but no tachypnea or wheezing/stridor/crackles or other abnormalities. Ambulates without tachypnea or hypoxia.  Abdominal:     General: There is no distension.  Musculoskeletal:        General: Normal range of motion.     Cervical back: Normal range of motion.  Neurological:     Mental Status: He is alert.     ED Results / Procedures / Treatments   Labs (all labs ordered are listed, but only abnormal results are displayed) Labs Reviewed - No data to display  EKG None  Radiology No results found.  Procedures Procedures  Medications Ordered in ED Medications  albuterol  (VENTOLIN  HFA) 108 (90 Base) MCG/ACT inhaler 2 puff (has no administration in time range)    ED Course/ Medical Decision Making/ A&P                                 Medical Decision Making Risk Prescription drug management.   Inhaler provided. Vitals and exam reassuring, no indication for imagine, steroids or further workup at this time.    Final Clinical Impression(s) / ED Diagnoses Final diagnoses:  Asthma, unspecified asthma severity, unspecified whether complicated, unspecified whether persistent    Rx / DC Orders ED Discharge Orders     None         Rosealynn Mateus, Reymundo Caulk, MD 07/25/23 0151

## 2023-07-25 NOTE — ED Triage Notes (Signed)
 Pt with asthma and states he has "tightness in chest" from lack of air movement and wheezes. States he ran out of his albuterol  inhaler and needs a refill. States has appt with PCP on Monday for refill but "cannot make it through weekend".

## 2023-09-25 ENCOUNTER — Emergency Department
Admission: EM | Admit: 2023-09-25 | Discharge: 2023-09-25 | Disposition: A | Discharge status: Home / Self Care | Attending: Emergency Medicine | Admitting: Emergency Medicine

## 2023-09-25 ENCOUNTER — Other Ambulatory Visit: Payer: Self-pay

## 2023-09-25 DIAGNOSIS — R1030 Lower abdominal pain, unspecified: Secondary | ICD-10-CM | POA: Insufficient documentation

## 2023-09-25 DIAGNOSIS — Z760 Encounter for issue of repeat prescription: Secondary | ICD-10-CM | POA: Insufficient documentation

## 2023-09-25 MED ORDER — INSULIN GLARGINE 100 UNIT/ML ~~LOC~~ SOLN: 32.0000 [IU] | Freq: Two times a day (BID) | SUBCUTANEOUS | 3 refills | Status: DC

## 2023-09-25 MED ORDER — INSULIN ASPART 100 UNIT/ML FLEXPEN: 5.0000 [IU] | PEN_INJECTOR | Freq: Three times a day (TID) | SUBCUTANEOUS | 11 refills | Status: DC

## 2023-09-25 MED ORDER — INSULIN GLARGINE 100 UNIT/ML SOLOSTAR PEN: 32.0000 [IU] | PEN_INJECTOR | Freq: Two times a day (BID) | SUBCUTANEOUS | 1 refills | Status: AC

## 2023-09-25 MED ORDER — INSULIN ASPART 100 UNIT/ML FLEXPEN: 5.0000 [IU] | PEN_INJECTOR | Freq: Three times a day (TID) | SUBCUTANEOUS | 11 refills | Status: AC

## 2023-09-25 MED ORDER — ALBUTEROL SULFATE HFA 108 (90 BASE) MCG/ACT IN AERS: 2.0000 | INHALATION_SPRAY | Freq: Four times a day (QID) | RESPIRATORY_TRACT | 2 refills | Status: AC | PRN

## 2023-09-25 NOTE — ED Triage Notes (Signed)
 Pt state he just needs his insulin  refill and inhaler. Pt denies any other complaints.

## 2023-09-25 NOTE — ED Provider Notes (Signed)
 [facility] Emergency Department Provider Note     Event Date/Time   First MD Initiated Contact with Patient 09/25/23 1238     (approximate)   History   Medication Refill and Abdominal Pain   HPI  Henry Hardin is a 38 y.o. male presents to the ED for multiple complaints including medication refill of his insulin  and albuterol  inhaler.  Patient reports he resides in [place]  however his car got stuck here in [place]  and his medications are low.  He also states he has [place] Medicaid and has been to a pharmacy that was unable to refill his insulin .  Triage note also states unrelated abdominal pain that comes and goes localized to his suprapubic region for 3 weeks.  Denies urinary symptoms.  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  He does not want this worked up but mentioned during history intake.     Physical Exam   Triage Vital Signs: ED Triage Vitals  Encounter Vitals Group     BP 09/25/23 1223 (!) 151/104     Girls Systolic BP Percentile --      Girls Diastolic BP Percentile --      Boys Systolic BP Percentile --      Boys Diastolic BP Percentile --      Pulse Rate 09/25/23 1223 64     Resp 09/25/23 1223 18     Temp 09/25/23 1223 98 F (36.7 C)     Temp src --      SpO2 09/25/23 1223 100 %     Weight 09/25/23 1222 185 lb (83.9 kg)     Height 09/25/23 1222 6' 2 (1.88 m)     Head Circumference --      Peak Flow --      Pain Score 09/25/23 1219 5     Pain Loc --      Pain Education --      Exclude from Growth Chart --     Most recent vital signs: Vitals:   09/25/23 1223  BP: (!) 151/104  Pulse: 64  Resp: 18  Temp: 98 F (36.7 C)  SpO2: 100%   General Well appearing, awake, no distress.  HEENT NCAT.  CV:  Good peripheral perfusion.  RESP:  Normal effort.  ABD:  No distention. No mass or bulge  noted.  ED Results / Procedures / Treatments   Labs (all labs ordered are listed, but only abnormal results are displayed) Labs Reviewed - No data to display  No results found.  PROCEDURES:  Critical Care performed: No  Procedures  MEDICATIONS ORDERED IN ED: Medications - No data to display  IMPRESSION / MDM / ASSESSMENT AND PLAN / ED COURSE  I reviewed the triage vital signs and the nursing notes.                               38 y.o. male presents to the emergency department for medication refill.   Patient's presentation is most consistent with acute, uncomplicated illness.  Offered abdominal pain workup with basic labs and urine, however patient declined.  States he does not have time for a workup.   Printed prescription for albuterol  inhaler, NovoLog  and Lantus .  ED precautions discussed.  Made patient aware he  is more than welcome to return to ED for evaluation of abdominal pain if worsen, advised him to follow-up with his primary care provider which he verbalized understanding.  He is in stable condition for discharge home.   FINAL CLINICAL IMPRESSION(S) / ED DIAGNOSES   Final diagnoses:  Encounter for medication refill   Rx / DC Orders   ED Discharge Orders          Ordered    insulin  glargine (LANTUS ) 100 UNIT/ML injection  2 times daily,   Status:  Discontinued        09/25/23 1343    insulin  aspart (NOVOLOG ) 100 UNIT/ML FlexPen  3 times daily with meals,   Status:  Discontinued        09/25/23 1343    albuterol  (VENTOLIN  HFA) 108 (90 Base) MCG/ACT inhaler  Every 6 hours PRN        09/25/23 1343    insulin  glargine (LANTUS ) 100 UNIT/ML Solostar Pen  2 times daily       Note to Pharmacy: May substitute as needed per insurance.   09/25/23 1347    insulin  aspart (NOVOLOG ) 100 UNIT/ML FlexPen  3 times daily with meals        09/25/23 1347             Note:  This document was prepared using Dragon voice recognition software and may include  unintentional dictation errors.    Margrette, Nolton Denis A, PA-C 09/25/23 1511    Claudene Rover, MD 09/25/23 [phone]

## 2023-09-25 NOTE — ED Triage Notes (Signed)
 Pt to ED via [facility] from home. Pt reports out of insulin  since yesterday. Pt reports unrelated abd pain x3 wks.   HR 67 100% RA 147/97 CBG 104

## 2023-09-25 NOTE — ED Notes (Signed)
 See triage note  States he is here for medication refill  States he has been having some slight abd pain   But this started about 3-4 weeks ago

## 2023-09-25 NOTE — Discharge Instructions (Addendum)
 Schedule appointment as soon as possible with your primary care provider for medication management.

## 2023-11-03 ENCOUNTER — Other Ambulatory Visit: Payer: Self-pay

## 2023-11-03 ENCOUNTER — Emergency Department (HOSPITAL_COMMUNITY)

## 2023-11-03 ENCOUNTER — Emergency Department (HOSPITAL_COMMUNITY)
Admission: EM | Admit: 2023-11-03 | Discharge: 2023-11-04 | Discharge status: Against Medical Advice | Attending: Emergency Medicine | Admitting: Emergency Medicine

## 2023-11-03 DIAGNOSIS — Z5321 Procedure and treatment not carried out due to patient leaving prior to being seen by health care provider: Secondary | ICD-10-CM | POA: Insufficient documentation

## 2023-11-03 DIAGNOSIS — R079 Chest pain, unspecified: Secondary | ICD-10-CM | POA: Diagnosis present

## 2023-11-03 NOTE — ED Triage Notes (Signed)
 Pt bib [facility] with complaints of centralized chest pain that started 2-3 days ago. Pt states that he also has pins and needles pain in bil feet.

## 2023-11-03 NOTE — ED Notes (Signed)
 Blue top and DG sent to lab

## 2023-11-03 NOTE — ED Provider Triage Note (Signed)
 Emergency Medicine Provider Triage Evaluation Note  Henry Hardin , a 38 y.o. male  was evaluated in triage.  Pt complains of chest pain for the past few days.  States that it feels a little like asthma, but sharper.    Also states that he has pain in bilateral feet that he describes as electric shock.  He has hx of diabetes.  Review of Systems  Positive: CP Negative:   Physical Exam  BP 122/77 (BP Location: Right Arm)   Pulse 64   Temp 98.3 F (36.8 C) (Oral)   Resp 18   SpO2 100%  Gen:   Awake, no distress   Resp:  Normal effort  MSK:   Moves extremities without difficulty  Other:    Medical Decision Making  Medically screening exam initiated at 11:39 PM.  Appropriate orders placed.  Acey L Windle was informed that the remainder of the evaluation will be completed by another provider, this initial triage assessment does not replace that evaluation, and the importance of remaining in the ED until their evaluation is complete.     Vicky Charleston, PA-C 11/03/23 2341

## 2023-11-04 ENCOUNTER — Emergency Department (HOSPITAL_COMMUNITY)
Admission: EM | Admit: 2023-11-04 | Discharge: 2023-11-04 | Discharge status: Against Medical Advice | Source: Home / Self Care

## 2023-11-04 DIAGNOSIS — R079 Chest pain, unspecified: Secondary | ICD-10-CM | POA: Insufficient documentation

## 2023-11-04 DIAGNOSIS — Z5321 Procedure and treatment not carried out due to patient leaving prior to being seen by health care provider: Secondary | ICD-10-CM | POA: Insufficient documentation

## 2023-11-04 LAB — BASIC METABOLIC PANEL WITH GFR
Anion gap: 7 (ref 5–15)
BUN: 10 mg/dL (ref 6–20)
CO2: 32 mmol/L (ref 22–32)
Calcium: 8.8 mg/dL — ABNORMAL LOW (ref 8.9–10.3)
Chloride: 101 mmol/L (ref 98–111)
Creatinine, Ser: 1.23 mg/dL (ref 0.61–1.24)
GFR, Estimated: >60 mL/min (ref >60)
Glucose, Bld: 123 mg/dL — ABNORMAL HIGH (ref 70–99)
Potassium: 3.7 mmol/L (ref 3.5–5.1)
Sodium: 140 mmol/L (ref 135–145)

## 2023-11-04 LAB — CBC
HCT: 46.2 % (ref 39.0–52.0)
Hemoglobin: 15.2 g/dL (ref 13.0–17.0)
MCH: 30 pg (ref 26.0–34.0)
MCHC: 32.9 g/dL (ref 30.0–36.0)
MCV: 91.1 fL (ref 80.0–100.0)
Platelets: 283 K/uL (ref 150–400)
RBC: 5.07 MIL/uL (ref 4.22–5.81)
RDW: 12.7 % (ref 11.5–15.5)
WBC: 5.6 K/uL (ref 4.0–10.5)
nRBC: 0 % (ref 0.0–0.2)

## 2023-11-04 LAB — TROPONIN I (HIGH SENSITIVITY)
Troponin I (High Sensitivity): 5 ng/L (ref <18)
Troponin I (High Sensitivity): 5 ng/L (ref <18)

## 2023-11-04 NOTE — ED Triage Notes (Addendum)
 Patient reports central chest pain for 3 days , seen here this evening chest x-ray and blood tests done [facility] , returned with same complaints . Verbally abusive with RN at triage during encounter.

## 2023-11-04 NOTE — ED Notes (Signed)
 Pt called for repeat trop with no answer

## 2023-11-04 NOTE — ED Notes (Signed)
 Patient left AMA.
